# Patient Record
Sex: Male | Born: 1965 | Race: White | Hispanic: No | Marital: Single | Smoking: Never smoker
Health system: Southern US, Community
[De-identification: ages and names within clinical notes are randomized; demographics above are authoritative.]

## PROBLEM LIST (undated history)

## (undated) DIAGNOSIS — M1990 Unspecified osteoarthritis, unspecified site: Secondary | ICD-10-CM

## (undated) DIAGNOSIS — Z87442 Personal history of urinary calculi: Secondary | ICD-10-CM

## (undated) DIAGNOSIS — F419 Anxiety disorder, unspecified: Secondary | ICD-10-CM

## (undated) DIAGNOSIS — R519 Headache, unspecified: Secondary | ICD-10-CM

## (undated) DIAGNOSIS — K219 Gastro-esophageal reflux disease without esophagitis: Secondary | ICD-10-CM

## (undated) DIAGNOSIS — R51 Headache: Secondary | ICD-10-CM

## (undated) DIAGNOSIS — N200 Calculus of kidney: Secondary | ICD-10-CM

## (undated) HISTORY — PX: KNEE SURGERY: SHX244

## (undated) HISTORY — DX: Calculus of kidney: N20.0

## (undated) HISTORY — PX: KNEE ARTHROSCOPY: SUR90

## (undated) HISTORY — PX: SHOULDER SURGERY: SHX246

## (undated) HISTORY — PX: TONSILLECTOMY AND ADENOIDECTOMY: SHX28

## (undated) HISTORY — PX: OTHER SURGICAL HISTORY: SHX169

---

## 2001-12-24 ENCOUNTER — Ambulatory Visit (HOSPITAL_BASED_OUTPATIENT_CLINIC_OR_DEPARTMENT_OTHER): Admission: RE | Admit: 2001-12-24 | Discharge: 2001-12-24 | Payer: Self-pay | Admitting: Orthopedic Surgery

## 2002-01-07 ENCOUNTER — Encounter: Admission: RE | Admit: 2002-01-07 | Discharge: 2002-01-22 | Payer: Self-pay | Admitting: Orthopedic Surgery

## 2002-02-06 ENCOUNTER — Ambulatory Visit (HOSPITAL_BASED_OUTPATIENT_CLINIC_OR_DEPARTMENT_OTHER): Admission: RE | Admit: 2002-02-06 | Discharge: 2002-02-07 | Payer: Self-pay | Admitting: Orthopedic Surgery

## 2002-02-20 ENCOUNTER — Encounter: Admission: RE | Admit: 2002-02-20 | Discharge: 2002-04-24 | Payer: Self-pay | Admitting: Orthopedic Surgery

## 2011-12-08 DIAGNOSIS — N200 Calculus of kidney: Secondary | ICD-10-CM

## 2011-12-08 HISTORY — DX: Calculus of kidney: N20.0

## 2012-05-10 ENCOUNTER — Ambulatory Visit (INDEPENDENT_AMBULATORY_CARE_PROVIDER_SITE_OTHER): Payer: PRIVATE HEALTH INSURANCE | Admitting: Family Medicine

## 2012-05-10 VITALS — BP 120/79 | HR 54 | Temp 97.4°F | Resp 16 | Ht 68.0 in | Wt 193.4 lb

## 2012-05-10 DIAGNOSIS — N400 Enlarged prostate without lower urinary tract symptoms: Secondary | ICD-10-CM

## 2012-05-10 DIAGNOSIS — R109 Unspecified abdominal pain: Secondary | ICD-10-CM

## 2012-05-10 DIAGNOSIS — N200 Calculus of kidney: Secondary | ICD-10-CM

## 2012-05-10 LAB — POCT URINALYSIS DIPSTICK
Bilirubin, UA: NEGATIVE
Glucose, UA: NEGATIVE
Ketones, UA: NEGATIVE
Leukocytes, UA: NEGATIVE
Nitrite, UA: NEGATIVE
Protein, UA: NEGATIVE
Spec Grav, UA: 1.02
Urobilinogen, UA: 0.2
pH, UA: 5.5

## 2012-05-10 LAB — IFOBT (OCCULT BLOOD): IFOBT: NEGATIVE

## 2012-05-10 LAB — POCT CBC
Granulocyte percent: 76.1 %G (ref 37–80)
HCT, POC: 50.3 % (ref 43.5–53.7)
Hemoglobin: 16.1 g/dL (ref 14.1–18.1)
Lymph, poc: 1.5 (ref 0.6–3.4)
MCH, POC: 30.3 pg (ref 27–31.2)
MCHC: 32 g/dL (ref 31.8–35.4)
MCV: 94.8 fL (ref 80–97)
MID (cbc): 0.4 (ref 0–0.9)
MPV: 9.2 fL (ref 0–99.8)
POC Granulocyte: 6 (ref 2–6.9)
POC LYMPH PERCENT: 18.4 %L (ref 10–50)
POC MID %: 5.5 %M (ref 0–12)
Platelet Count, POC: 311 10*3/uL (ref 142–424)
RBC: 5.31 M/uL (ref 4.69–6.13)
RDW, POC: 13.1 %
WBC: 7.9 10*3/uL (ref 4.6–10.2)

## 2012-05-10 LAB — POCT UA - MICROSCOPIC ONLY
Bacteria, U Microscopic: NEGATIVE
Casts, Ur, LPF, POC: NEGATIVE
Crystals, Ur, HPF, POC: NEGATIVE
Mucus, UA: POSITIVE
Yeast, UA: NEGATIVE

## 2012-05-10 MED ORDER — TAMSULOSIN HCL 0.4 MG PO CAPS: 0.4000 mg | ORAL_CAPSULE | Freq: Every day | ORAL | Status: DC

## 2012-05-10 NOTE — Patient Instructions (Signed)

## 2012-05-10 NOTE — Progress Notes (Signed)
46 yo man with acute right flank pain, urgency, hesitancy, and vomiting.  He went to work, and after suffering intense pain for a while, vomited on the way over here and pain suddenly resolved.  PMHx:  Positive for panic attack in the past.  Has h/o BPH after ER evaluation.  He's had problems passing uring for past 4 months.  F/Hx:  Father had kidney stones  Objective:  NAD, alert,  Abdomen:  Neg for tenderness, HSM, masses Genitalia:  Normal Prostate:  Mild enlargement without nodules Results for orders placed in visit on 05/10/12  POCT UA - MICROSCOPIC ONLY      Component Value Range   WBC, Ur, HPF, POC 0-1     RBC, urine, microscopic 5-8     Bacteria, U Microscopic neg     Mucus, UA positive     Epithelial cells, urine per micros 0-1     Crystals, Ur, HPF, POC neg     Casts, Ur, LPF, POC neg     Yeast, UA neg    POCT URINALYSIS DIPSTICK      Component Value Range   Color, UA yellow     Clarity, UA clear     Glucose, UA neg     Bilirubin, UA neg     Ketones, UA neg     Spec Grav, UA 1.020     Blood, UA moderate     pH, UA 5.5     Protein, UA neg     Urobilinogen, UA 0.2     Nitrite, UA neg     Leukocytes, UA Negative    IFOBT (OCCULT BLOOD)      Component Value Range   IFOBT Negative    POCT CBC      Component Value Range   WBC 7.9  4.6 - 10.2 K/uL   Lymph, poc 1.5  0.6 - 3.4   POC LYMPH PERCENT 18.4  10 - 50 %L   MID (cbc) 0.4  0 - 0.9   POC MID % 5.5  0 - 12 %M   POC Granulocyte 6.0  2 - 6.9   Granulocyte percent 76.1  37 - 80 %G   RBC 5.31  4.69 - 6.13 M/uL   Hemoglobin 16.1  14.1 - 18.1 g/dL   HCT, POC 16.1  09.6 - 53.7 %   MCV 94.8  80 - 97 fL   MCH, POC 30.3  27 - 31.2 pg   MCHC 32.0  31.8 - 35.4 g/dL   RDW, POC 04.5     Platelet Count, POC 311  142 - 424 K/uL   MPV 9.2  0 - 99.8 fL   A:  Acute kidney stone which appears to have passed.

## 2012-05-11 LAB — PSA: PSA: 0.74 ng/mL (ref <=4.00)

## 2012-05-11 LAB — COMPREHENSIVE METABOLIC PANEL
ALT: 18 U/L (ref 0–53)
AST: 20 U/L (ref 0–37)
Albumin: 4.4 g/dL (ref 3.5–5.2)
Alkaline Phosphatase: 81 U/L (ref 39–117)
BUN: 8 mg/dL (ref 6–23)
CO2: 29 mEq/L (ref 19–32)
Calcium: 9.2 mg/dL (ref 8.4–10.5)
Chloride: 103 mEq/L (ref 96–112)
Creat: 0.93 mg/dL (ref 0.50–1.35)
Glucose, Bld: 98 mg/dL (ref 70–99)
Potassium: 4.2 mEq/L (ref 3.5–5.3)
Sodium: 140 mEq/L (ref 135–145)
Total Bilirubin: 0.9 mg/dL (ref 0.3–1.2)
Total Protein: 6.9 g/dL (ref 6.0–8.3)

## 2012-05-11 LAB — LIPID PANEL
Cholesterol: 138 mg/dL (ref 0–200)
HDL: 43 mg/dL (ref >39)
LDL Cholesterol: 70 mg/dL (ref 0–99)
Total CHOL/HDL Ratio: 3.2 Ratio
Triglycerides: 125 mg/dL (ref <150)
VLDL: 25 mg/dL (ref 0–40)

## 2012-06-17 ENCOUNTER — Other Ambulatory Visit (HOSPITAL_COMMUNITY): Payer: Self-pay | Admitting: Urology

## 2012-06-17 ENCOUNTER — Ambulatory Visit (HOSPITAL_COMMUNITY)
Admission: RE | Admit: 2012-06-17 | Discharge: 2012-06-17 | Disposition: A | Discharge status: Home / Self Care | Payer: PRIVATE HEALTH INSURANCE | Source: Ambulatory Visit | Attending: Urology | Admitting: Urology

## 2012-06-17 DIAGNOSIS — N201 Calculus of ureter: Secondary | ICD-10-CM | POA: Insufficient documentation

## 2012-06-17 DIAGNOSIS — R1031 Right lower quadrant pain: Secondary | ICD-10-CM | POA: Insufficient documentation

## 2012-06-17 DIAGNOSIS — R3911 Hesitancy of micturition: Secondary | ICD-10-CM | POA: Insufficient documentation

## 2015-03-01 ENCOUNTER — Telehealth: Payer: Self-pay | Admitting: Family Medicine

## 2015-03-02 NOTE — Telephone Encounter (Signed)
Stp and he ntbs to establish care, given appt with Jannifer Rodneyhristy Hawks 7/6 at 9:10. Pt aware to arrive 30 minutes prior with a copy of insurance card and a valid photo ID. Pt only takes ibuprofen on a daily basis.

## 2015-04-14 ENCOUNTER — Encounter: Payer: Self-pay | Admitting: Family

## 2015-04-14 ENCOUNTER — Ambulatory Visit (INDEPENDENT_AMBULATORY_CARE_PROVIDER_SITE_OTHER): Payer: PRIVATE HEALTH INSURANCE | Admitting: Family

## 2015-04-14 VITALS — BP 120/87 | HR 68 | Temp 97.0°F | Ht 68.0 in | Wt 205.0 lb

## 2015-04-14 DIAGNOSIS — K921 Melena: Secondary | ICD-10-CM

## 2015-04-14 DIAGNOSIS — Z0000 Encounter for general adult medical examination without abnormal findings: Secondary | ICD-10-CM | POA: Diagnosis not present

## 2015-04-14 DIAGNOSIS — M25562 Pain in left knee: Secondary | ICD-10-CM

## 2015-04-14 DIAGNOSIS — G8929 Other chronic pain: Secondary | ICD-10-CM

## 2015-04-14 DIAGNOSIS — R5383 Other fatigue: Secondary | ICD-10-CM

## 2015-04-14 MED ORDER — TRAMADOL HCL 50 MG PO TABS: 50.0000 mg | ORAL_TABLET | Freq: Three times a day (TID) | ORAL | Status: DC | PRN

## 2015-04-14 NOTE — Patient Instructions (Addendum)
Health Maintenance A healthy lifestyle and preventative care can promote health and wellness.  Maintain regular health, dental, and eye exams.  Eat a healthy diet. Foods like vegetables, fruits, whole grains, low-fat dairy products, and lean protein foods contain the nutrients you need and are low in calories. Decrease your intake of foods high in solid fats, added sugars, and salt. Get information about a proper diet from your health care provider, if necessary.  Regular physical exercise is one of the most important things you can do for your health. Most adults should get at least 150 minutes of moderate-intensity exercise (any activity that increases your heart rate and causes you to sweat) each week. In addition, most adults need muscle-strengthening exercises on 2 or more days a week.   Maintain a healthy weight. The body mass index (BMI) is a screening tool to identify possible weight problems. It provides an estimate of body fat based on height and weight. Your health care provider can find your BMI and can help you achieve or maintain a healthy weight. For males 20 years and older:  A BMI below 18.5 is considered underweight.  A BMI of 18.5 to 24.9 is normal.  A BMI of 25 to 29.9 is considered overweight.  A BMI of 30 and above is considered obese.  Maintain normal blood lipids and cholesterol by exercising and minimizing your intake of saturated fat. Eat a balanced diet with plenty of fruits and vegetables. Blood tests for lipids and cholesterol should begin at age 20 and be repeated every 5 years. If your lipid or cholesterol levels are high, you are over age 50, or you are at high risk for heart disease, you may need your cholesterol levels checked more frequently.Ongoing high lipid and cholesterol levels should be treated with medicines if diet and exercise are not working.  If you smoke, find out from your health care provider how to quit. If you do not use tobacco, do not  start.  Lung cancer screening is recommended for adults aged 55-80 years who are at high risk for developing lung cancer because of a history of smoking. A yearly low-dose CT scan of the lungs is recommended for people who have at least a 30-pack-year history of smoking and are current smokers or have quit within the past 15 years. A pack year of smoking is smoking an average of 1 pack of cigarettes a day for 1 year (for example, a 30-pack-year history of smoking could mean smoking 1 pack a day for 30 years or 2 packs a day for 15 years). Yearly screening should continue until the smoker has stopped smoking for at least 15 years. Yearly screening should be stopped for people who develop a health problem that would prevent them from having lung cancer treatment.  If you choose to drink alcohol, do not have more than 2 drinks per day. One drink is considered to be 12 oz (360 mL) of beer, 5 oz (150 mL) of wine, or 1.5 oz (45 mL) of liquor.  Avoid the use of street drugs. Do not share needles with anyone. Ask for help if you need support or instructions about stopping the use of drugs.  High blood pressure causes heart disease and increases the risk of stroke. Blood pressure should be checked at least every 1-2 years. Ongoing high blood pressure should be treated with medicines if weight loss and exercise are not effective.  If you are 45-79 years old, ask your health care provider if   you should take aspirin to prevent heart disease.  Diabetes screening involves taking a blood sample to check your fasting blood sugar level. This should be done once every 3 years after age 45 if you are at a normal weight and without risk factors for diabetes. Testing should be considered at a younger age or be carried out more frequently if you are overweight and have at least 1 risk factor for diabetes.  Colorectal cancer can be detected and often prevented. Most routine colorectal cancer screening begins at the age of 50  and continues through age 75. However, your health care provider may recommend screening at an earlier age if you have risk factors for colon cancer. On a yearly basis, your health care provider may provide home test kits to check for hidden blood in the stool. A small camera at the end of a tube may be used to directly examine the colon (sigmoidoscopy or colonoscopy) to detect the earliest forms of colorectal cancer. Talk to your health care provider about this at age 50 when routine screening begins. A direct exam of the colon should be repeated every 5-10 years through age 75, unless early forms of precancerous polyps or small growths are found.  People who are at an increased risk for hepatitis B should be screened for this virus. You are considered at high risk for hepatitis B if:  You were born in a country where hepatitis B occurs often. Talk with your health care provider about which countries are considered high risk.  Your parents were born in a high-risk country and you have not received a shot to protect against hepatitis B (hepatitis B vaccine).  You have HIV or AIDS.  You use needles to inject street drugs.  You live with, or have sex with, someone who has hepatitis B.  You are a man who has sex with other men (MSM).  You get hemodialysis treatment.  You take certain medicines for conditions like cancer, organ transplantation, and autoimmune conditions.  Hepatitis C blood testing is recommended for all people born from 1945 through 1965 and any individual with known risk factors for hepatitis C.  Healthy men should no longer receive prostate-specific antigen (PSA) blood tests as part of routine cancer screening. Talk to your health care provider about prostate cancer screening.  Testicular cancer screening is not recommended for adolescents or adult males who have no symptoms. Screening includes self-exam, a health care provider exam, and other screening tests. Consult with your  health care provider about any symptoms you have or any concerns you have about testicular cancer.  Practice safe sex. Use condoms and avoid high-risk sexual practices to reduce the spread of sexually transmitted infections (STIs).  You should be screened for STIs, including gonorrhea and chlamydia if:  You are sexually active and are younger than 24 years.  You are older than 24 years, and your health care provider tells you that you are at risk for this type of infection.  Your sexual activity has changed since you were last screened, and you are at an increased risk for chlamydia or gonorrhea. Ask your health care provider if you are at risk.  If you are at risk of being infected with HIV, it is recommended that you take a prescription medicine daily to prevent HIV infection. This is called pre-exposure prophylaxis (PrEP). You are considered at risk if:  You are a man who has sex with other men (MSM).  You are a heterosexual man who   is sexually active with multiple partners.  You take drugs by injection.  You are sexually active with a partner who has HIV.  Talk with your health care provider about whether you are at high risk of being infected with HIV. If you choose to begin PrEP, you should first be tested for HIV. You should then be tested every 3 months for as long as you are taking PrEP.  Use sunscreen. Apply sunscreen liberally and repeatedly throughout the day. You should seek shade when your shadow is shorter than you. Protect yourself by wearing long sleeves, pants, a wide-brimmed hat, and sunglasses year round whenever you are outdoors.  Tell your health care provider of new moles or changes in moles, especially if there is a change in shape or color. Also, tell your health care provider if a mole is larger than the size of a pencil eraser.  A one-time screening for abdominal aortic aneurysm (AAA) and surgical repair of large AAAs by ultrasound is recommended for men aged  65-75 years who are current or former smokers.  Stay current with your vaccines (immunizations). Document Released: 03/23/2008 Document Revised: 09/30/2013 Document Reviewed: 02/20/2011 [facility] Patient Information 2015 [place], [place]. This information is not intended to replace advice given to you by your health care provider. Make sure you discuss any questions you have with your health care provider. Knee Pain The knee is the complex joint between your thigh and your lower leg. It is made up of bones, tendons, ligaments, and cartilage. The bones that make up the knee are:  The femur in the thigh.  The tibia and fibula in the lower leg.  The patella or kneecap riding in the groove on the lower femur. CAUSES  Knee pain is a common complaint with many causes. A few of these causes are:  Injury, such as:  A ruptured ligament or tendon injury.  Torn cartilage.  Medical conditions, such as:  Gout  Arthritis  Infections  Overuse, over training, or overdoing a physical activity. Knee pain can be minor or severe. Knee pain can accompany debilitating injury. Minor knee problems often respond well to self-care measures or get well on their own. More serious injuries may need medical intervention or even surgery. SYMPTOMS The knee is complex. Symptoms of knee problems can vary widely. Some of the problems are:  Pain with movement and weight bearing.  Swelling and tenderness.  Buckling of the knee.  Inability to straighten or extend your knee.  Your knee locks and you cannot straighten it.  Warmth and redness with pain and fever.  Deformity or dislocation of the kneecap. DIAGNOSIS  Determining what is wrong may be very straight forward such as when there is an injury. It can also be challenging because of the complexity of the knee. Tests to make a diagnosis may include:  Your caregiver taking a history and doing a physical exam.  Routine X-rays can be used to rule out  other problems. X-rays will not reveal a cartilage tear. Some injuries of the knee can be diagnosed by:  Arthroscopy a surgical technique by which a small video camera is inserted through tiny incisions on the sides of the knee. This procedure is used to examine and repair internal knee joint problems. Tiny instruments can be used during arthroscopy to repair the torn knee cartilage (meniscus).  Arthrography is a radiology technique. A contrast liquid is directly injected into the knee joint. Internal structures of the knee joint then become visible on X-ray  film.  An MRI scan is a non X-ray radiology procedure in which magnetic fields and a computer produce two- or three-dimensional images of the inside of the knee. Cartilage tears are often visible using an MRI scanner. MRI scans have largely replaced arthrography in diagnosing cartilage tears of the knee.  Blood work.  Examination of the fluid that helps to lubricate the knee joint (synovial fluid). This is done by taking a sample out using a needle and a syringe. TREATMENT The treatment of knee problems depends on the cause. Some of these treatments are:  Depending on the injury, proper casting, splinting, surgery, or physical therapy care will be needed.  Give yourself adequate recovery time. Do not overuse your joints. If you begin to get sore during workout routines, back off. Slow down or do fewer repetitions.  For repetitive activities such as cycling or running, maintain your strength and nutrition.  Alternate muscle groups. For example, if you are a weight lifter, work the upper body on one day and the lower body the next.  Either tight or weak muscles do not give the proper support for your knee. Tight or weak muscles do not absorb the stress placed on the knee joint. Keep the muscles surrounding the knee strong.  Take care of mechanical problems.  If you have flat feet, orthotics or special shoes may help. See your caregiver if  you need help.  Arch supports, sometimes with wedges on the inner or outer aspect of the heel, can help. These can shift pressure away from the side of the knee most bothered by osteoarthritis.  A brace called an "unloader" brace also may be used to help ease the pressure on the most arthritic side of the knee.  If your caregiver has prescribed crutches, braces, wraps or ice, use as directed. The acronym for this is PRICE. This means protection, rest, ice, compression, and elevation.  Nonsteroidal anti-inflammatory drugs (NSAIDs), can help relieve pain. But if taken immediately after an injury, they may actually increase swelling. Take NSAIDs with food in your stomach. Stop them if you develop stomach problems. Do not take these if you have a history of ulcers, stomach pain, or bleeding from the bowel. Do not take without your caregiver's approval if you have problems with fluid retention, heart failure, or kidney problems.  For ongoing knee problems, physical therapy may be helpful.  Glucosamine and chondroitin are over-the-counter dietary supplements. Both may help relieve the pain of osteoarthritis in the knee. These medicines are different from the usual anti-inflammatory drugs. Glucosamine may decrease the rate of cartilage destruction.  Injections of a corticosteroid drug into your knee joint may help reduce the symptoms of an arthritis flare-up. They may provide pain relief that lasts a few months. You may have to wait a few months between injections. The injections do have a small increased risk of infection, water retention, and elevated blood sugar levels.  Hyaluronic acid injected into damaged joints may ease pain and provide lubrication. These injections may work by reducing inflammation. A series of shots may give relief for as long as 6 months.  Topical painkillers. Applying certain ointments to your skin may help relieve the pain and stiffness of osteoarthritis. Ask your pharmacist  for suggestions. Many over the-counter products are approved for temporary relief of arthritis pain.  In some countries, doctors often prescribe topical NSAIDs for relief of chronic conditions such as arthritis and tendinitis. A review of treatment with NSAID creams found that they worked as  well as oral medications but without the serious side effects. PREVENTION  Maintain a healthy weight. Extra pounds put more strain on your joints.  Get strong, stay limber. Weak muscles are a common cause of knee injuries. Stretching is important. Include flexibility exercises in your workouts.  Be smart about exercise. If you have osteoarthritis, chronic knee pain or recurring injuries, you may need to change the way you exercise. This does not mean you have to stop being active. If your knees ache after jogging or playing basketball, consider switching to swimming, water aerobics, or other low-impact activities, at least for a few days a week. Sometimes limiting high-impact activities will provide relief.  Make sure your shoes fit well. Choose footwear that is right for your sport.  Protect your knees. Use the proper gear for knee-sensitive activities. Use kneepads when playing volleyball or laying carpet. Buckle your seat belt every time you drive. Most shattered kneecaps occur in car accidents.  Rest when you are tired. SEEK MEDICAL CARE IF:  You have knee pain that is continual and does not seem to be getting better.  SEEK IMMEDIATE MEDICAL CARE IF:  Your knee joint feels hot to the touch and you have a high fever. MAKE SURE YOU:   Understand these instructions.  Will watch your condition.  Will get help right away if you are not doing well or get worse. Document Released: 07/23/2007 Document Revised: 12/18/2011 Document Reviewed: 07/23/2007 [facility] Patient Information 2015 [place]. This information is not intended to replace advice given to you by your health care provider. Make sure  you discuss any questions you have with your health care provider.

## 2015-04-14 NOTE — Progress Notes (Signed)
   Subjective:    Patient ID: Jack [name], male    DOB: 1965/11/24, 49 y.o.   MRN: [id number]  HPI Pt presents to the office to establish care and annual physical. Pt currently only taking Motrin and Tylenol for chronic left knee. Pt has had two different ACL repairs in the past. Pt states he has constipation and has had some bright red blood in his stool. Pt states  his BM is painful. Pt denies any headache, palpitations, SOB, or edema at this time.     Review of Systems  Constitutional: Positive for fatigue.  HENT: Negative.   Respiratory: Negative.   Cardiovascular: Negative.   Gastrointestinal: Positive for constipation and anal bleeding.  Endocrine: Negative.   Genitourinary: Negative.   Musculoskeletal: Negative.   Neurological: Negative.   Hematological: Negative.   Psychiatric/Behavioral: Negative.   All other systems reviewed and are negative.      Objective:   Physical Exam  Constitutional: He is oriented to person, place, and time. He appears well-developed and well-nourished. No distress.  HENT:  Head: Normocephalic.  Right Ear: External ear normal.  Left Ear: External ear normal.  Nose: Nose normal.  Mouth/Throat: Oropharynx is clear and moist.  Eyes: Pupils are equal, round, and reactive to light. Right eye exhibits no discharge. Left eye exhibits no discharge.  Neck: Normal range of motion. Neck supple. No thyromegaly present.  Cardiovascular: Normal rate, regular rhythm, normal heart sounds and intact distal pulses.   No murmur heard. Pulmonary/Chest: Effort normal and breath sounds normal. No respiratory distress. He has no wheezes.  Abdominal: Soft. Bowel sounds are normal. He exhibits no distension. There is no tenderness.  Musculoskeletal: Normal range of motion. He exhibits no edema or tenderness.  Neurological: He is alert and oriented to person, place, and time. He has normal reflexes. No cranial nerve deficit.  Skin: Skin is warm and dry. No rash  noted. No erythema.  Psychiatric: He has a normal mood and affect. His behavior is normal. Judgment and thought content normal.  Vitals reviewed.   BP 120/87 mmHg  Pulse 68  Temp(Src) 97 F (36.1 C) (Oral)  Ht $R'5\' 8"'wC$  (1.727 m)  Wt 205 lb (92.987 kg)  BMI 31.18 kg/m2       Assessment & Plan:  1. Laboratory tests ordered as part of a complete physical exam (CPE) - CMP14+EGFR - Lipid panel - Thyroid Panel With TSH - Vit D  25 hydroxy (rtn osteoporosis monitoring) - PSA Total+%Free (Serial) - Anemia Profile B  2. Blood in stool - Anemia Profile B  3. Other fatigue - Thyroid Panel With TSH - Vit D  25 hydroxy (rtn osteoporosis monitoring) - Anemia Profile B  4. Chronic pain of left knee - Anemia Profile B - Ambulatory referral to Orthopedic Surgery - traMADol (ULTRAM) 50 MG tablet; Take 1 tablet (50 mg total) by mouth every 8 (eight) hours as needed.  Dispense: 90 tablet; Refill: 0  5. Annual physical exam   Continue all meds Labs pending Health Maintenance reviewed Diet and exercise encouraged RTO 6 months   Evelina Dun, FNP

## 2015-04-15 ENCOUNTER — Telehealth: Payer: Self-pay | Admitting: *Deleted

## 2015-04-15 ENCOUNTER — Other Ambulatory Visit: Payer: Self-pay | Admitting: Family

## 2015-04-15 DIAGNOSIS — E559 Vitamin D deficiency, unspecified: Secondary | ICD-10-CM

## 2015-04-15 LAB — ANEMIA PROFILE B
BASOS ABS: 0.1 10*3/uL (ref 0.0–0.2)
BASOS: 1 %
EOS (ABSOLUTE): 0.2 10*3/uL (ref 0.0–0.4)
EOS: 2 %
FERRITIN: 86 ng/mL (ref 30–400)
Folate: 10.9 ng/mL (ref >3.0)
HEMOGLOBIN: 15.7 g/dL (ref 12.6–17.7)
Hematocrit: 46.1 % (ref 37.5–51.0)
IMMATURE GRANS (ABS): 0 10*3/uL (ref 0.0–0.1)
IMMATURE GRANULOCYTES: 0 %
Iron Saturation: 32 % (ref 15–55)
Iron: 94 ug/dL (ref 38–169)
Lymphocytes Absolute: 1.5 10*3/uL (ref 0.7–3.1)
Lymphs: 21 %
MCH: 30.6 pg (ref 26.6–33.0)
MCHC: 34.1 g/dL (ref 31.5–35.7)
MCV: 90 fL (ref 79–97)
MONOCYTES: 8 %
Monocytes Absolute: 0.6 10*3/uL (ref 0.1–0.9)
NEUTROS PCT: 68 %
Neutrophils Absolute: 5 10*3/uL (ref 1.4–7.0)
PLATELETS: 302 10*3/uL (ref 150–379)
RBC: 5.13 x10E6/uL (ref 4.14–5.80)
RDW: 14.1 % (ref 12.3–15.4)
Retic Ct Pct: 0.9 % (ref 0.6–2.6)
TIBC: 298 ug/dL (ref 250–450)
UIBC: 204 ug/dL (ref 111–343)
VITAMIN B 12: 228 pg/mL (ref 211–946)
WBC: 7.4 10*3/uL (ref 3.4–10.8)

## 2015-04-15 LAB — PSA TOTAL+% FREE (SERIAL)
PSA FREE PCT: 24.3 %
PSA FREE: 0.17 ng/mL
Prostate Specific Ag, Serum: 0.7 ng/mL (ref 0.0–4.0)

## 2015-04-15 LAB — CMP14+EGFR
A/G RATIO: 1.9 (ref 1.1–2.5)
ALBUMIN: 4.4 g/dL (ref 3.5–5.5)
ALK PHOS: 95 IU/L (ref 39–117)
ALT: 32 IU/L (ref 0–44)
AST: 30 IU/L (ref 0–40)
BUN / CREAT RATIO: 6 — AB (ref 9–20)
BUN: 7 mg/dL (ref 6–24)
Bilirubin Total: 0.4 mg/dL (ref 0.0–1.2)
CO2: 25 mmol/L (ref 18–29)
CREATININE: 1.09 mg/dL (ref 0.76–1.27)
Calcium: 9.5 mg/dL (ref 8.7–10.2)
Chloride: 104 mmol/L (ref 97–108)
GFR, EST AFRICAN AMERICAN: 93 mL/min/{1.73_m2} (ref >59)
GFR, EST NON AFRICAN AMERICAN: 80 mL/min/{1.73_m2} (ref >59)
GLOBULIN, TOTAL: 2.3 g/dL (ref 1.5–4.5)
Glucose: 101 mg/dL — ABNORMAL HIGH (ref 65–99)
Potassium: 5 mmol/L (ref 3.5–5.2)
SODIUM: 144 mmol/L (ref 134–144)
Total Protein: 6.7 g/dL (ref 6.0–8.5)

## 2015-04-15 LAB — THYROID PANEL WITH TSH
Free Thyroxine Index: 2.1 (ref 1.2–4.9)
T3 UPTAKE RATIO: 30 % (ref 24–39)
T4 TOTAL: 6.9 ug/dL (ref 4.5–12.0)
TSH: 0.767 u[IU]/mL (ref 0.450–4.500)

## 2015-04-15 LAB — VITAMIN D 25 HYDROXY (VIT D DEFICIENCY, FRACTURES): Vit D, 25-Hydroxy: 27.8 ng/mL — ABNORMAL LOW (ref 30.0–100.0)

## 2015-04-15 LAB — LIPID PANEL
CHOLESTEROL TOTAL: 166 mg/dL (ref 100–199)
Chol/HDL Ratio: 3.8 ratio units (ref 0.0–5.0)
HDL: 44 mg/dL (ref >39)
LDL Calculated: 93 mg/dL (ref 0–99)
TRIGLYCERIDES: 144 mg/dL (ref 0–149)
VLDL Cholesterol Cal: 29 mg/dL (ref 5–40)

## 2015-04-15 MED ORDER — VITAMIN D (ERGOCALCIFEROL) 1.25 MG (50000 UNIT) PO CAPS: 50000.0000 [IU] | ORAL_CAPSULE | ORAL | Status: DC

## 2015-04-15 NOTE — Telephone Encounter (Signed)
-----   Message from Junie Spencerhristy A Hawks, FNP sent at 04/15/2015  2:15 PM EDT ----- Kidney and liver function stable Cholesterol levels WNL Thyroid levels WNL Vit D levels low-Prescription sent to pharmacy   PSA levels WNL Anemia Profile (Iron levels, Vitamin B12, Folate, WBC, Hgb, & Plts)- WNL

## 2015-04-16 NOTE — Progress Notes (Signed)
Patient aware.

## 2015-05-26 ENCOUNTER — Encounter: Payer: Self-pay | Admitting: *Deleted

## 2015-06-17 ENCOUNTER — Ambulatory Visit (INDEPENDENT_AMBULATORY_CARE_PROVIDER_SITE_OTHER): Payer: PRIVATE HEALTH INSURANCE | Admitting: Family

## 2015-06-17 ENCOUNTER — Encounter: Payer: Self-pay | Admitting: Family

## 2015-06-17 ENCOUNTER — Ambulatory Visit: Payer: Self-pay | Admitting: Family

## 2015-06-17 ENCOUNTER — Ambulatory Visit (INDEPENDENT_AMBULATORY_CARE_PROVIDER_SITE_OTHER): Payer: PRIVATE HEALTH INSURANCE

## 2015-06-17 VITALS — BP 131/93 | HR 79 | Temp 97.9°F | Ht 68.0 in | Wt 205.4 lb

## 2015-06-17 DIAGNOSIS — Z01818 Encounter for other preprocedural examination: Secondary | ICD-10-CM

## 2015-06-17 NOTE — Patient Instructions (Signed)
Total Knee Replacement °Total knee replacement is a procedure to replace your knee joint with an artificial knee joint (prosthetic knee joint). The purpose of this surgery is to reduce pain and improve your knee function. °LET YOUR HEALTH CARE PROVIDER KNOW ABOUT:  °· Any allergies you have. °· All medicines you are taking, including vitamins, herbs, eye drops, creams, and over-the-counter medicines. °· Previous problems you or members of your family have had with the use of anesthetics. °· Any blood disorders you have. °· Previous surgeries you have had. °· Medical conditions you have. °RISKS AND COMPLICATIONS  °Generally, total knee replacement is a safe procedure. However, problems can occur and include: °· Loss of range of motion of the knee or instability. °· Loosening of the prosthesis. °· Infection. °· Persistent pain. °BEFORE THE PROCEDURE  °· Do not eat or drink anything after midnight on the night before the procedure or as directed by your health care provider. °· Ask your health care provider about changing or stopping your regular medicines. This is especially important if you are taking diabetes medicines or blood thinners. °PROCEDURE  °· Just before the procedure, you will receive medicine that will make you drowsy (sedative). This will be given through a tube that is inserted into one of your veins (IV tube). °· Then you will be given one of the following: °¨ A medicine injected into your spine that numbs your body below the waist (spinal anesthetic). °¨ A medicine that makes you fall asleep (general anesthetic). °¨ You may also receive medicine to block feeling in your leg (nerve block) to help ease pain after surgery. °· An incision will be made in your knee. Your surgeon will take out any damaged cartilage and bone by sawing off the damaged surfaces. °· The surgeon will then put a new metal liner over the sawed-off portion of your thigh bone (femur) and a plastic liner over the sawed-off portion  of one of the bones of your lower leg (tibia). This is to restore alignment and function to your knee. A plastic piece is often used to restore the surface of your knee cap. °AFTER THE PROCEDURE  °· You will be taken to the recovery area. °· You may have drainage tubes to drain excess fluid from your knee. These tubes attach to a device that removes these fluids. °· Once you are awake, stable, and taking fluids well, you will be taken to your hospital room. °· You will receive physical therapy as prescribed by your health care provider. °· Your surgeon may recommend that you spend time (usually an additional 10-14 days) in an extended-care facility to help you begin walking again and improve your range of motion before you go home. °· You may also be prescribed blood-thinning medicine to decrease your risk of developing blood clots in your leg. °Document Released: 01/01/2001 Document Revised: 02/09/2014 Document Reviewed: 11/05/2011 °[employer]® Patient Information ©2015 [employer]. This information is not intended to replace advice given to you by your health care provider. Make sure you discuss any questions you have with your health care provider. ° °

## 2015-06-17 NOTE — Progress Notes (Signed)
   Subjective:    Patient ID: Jack [name], male    DOB: 1965/11/19, 49 y.o.   MRN: [id number]  HPI Pt ;presents to the office surgical clearance for left total knee replacement. Pt currently only taking OTC pain medication and Vit D medication. Pt denies any headache, palpitations, SOB, or edema at this time.     Review of Systems  Constitutional: Negative.   HENT: Negative.   Respiratory: Negative.   Cardiovascular: Negative.   Gastrointestinal: Negative.   Endocrine: Negative.   Genitourinary: Negative.   Musculoskeletal: Negative.   Neurological: Negative.   Hematological: Negative.   Psychiatric/Behavioral: Negative.   All other systems reviewed and are negative.      Objective:   Physical Exam  Constitutional: He is oriented to person, place, and time. He appears well-developed and well-nourished. No distress.  HENT:  Head: Normocephalic.  Right Ear: External ear normal.  Left Ear: External ear normal.  Nose: Nose normal.  Mouth/Throat: Oropharynx is clear and moist.  Eyes: Pupils are equal, round, and reactive to light. Right eye exhibits no discharge. Left eye exhibits no discharge.  Neck: Normal range of motion. Neck supple. No thyromegaly present.  Cardiovascular: Normal rate, regular rhythm, normal heart sounds and intact distal pulses.   No murmur heard. Pulmonary/Chest: Effort normal and breath sounds normal. No respiratory distress. He has no wheezes.  Abdominal: Soft. Bowel sounds are normal. He exhibits no distension. There is no tenderness.  Musculoskeletal: Normal range of motion. He exhibits edema (trace amt in left knee). He exhibits no tenderness.  Neurological: He is alert and oriented to person, place, and time. He has normal reflexes. No cranial nerve deficit.  Skin: Skin is warm and dry. No rash noted. No erythema.  Psychiatric: He has a normal mood and affect. His behavior is normal. Judgment and thought content normal.  Vitals reviewed.  Chest  x-ray- WNL Preliminary reading by Evelina Dun, FNP [facility]  EKG- Sinus Rhythm   BP 131/93 mmHg  Pulse 79  Temp(Src) 97.9 F (36.6 C) (Oral)  Ht _0  (1.727 m)  Wt 205 lb 6.4 oz (93.169 kg)  BMI 31.24 kg/m2     Assessment & Plan:  1. Pre-op examination -Labs pending- Will fax surgical clearance form when labs are back -RTO prn - DG Chest 2 View; Future - EKG 12-Lead - CBC with Differential/Platelet - CMP14+EGFR  Evelina Dun, FNP

## 2015-06-18 ENCOUNTER — Ambulatory Visit: Payer: Self-pay | Admitting: Orthopedic Surgery

## 2015-06-18 LAB — CBC WITH DIFFERENTIAL/PLATELET
BASOS ABS: 0.1 10*3/uL (ref 0.0–0.2)
Basos: 1 %
EOS (ABSOLUTE): 0.5 10*3/uL — AB (ref 0.0–0.4)
Eos: 6 %
Hematocrit: 44.3 % (ref 37.5–51.0)
Hemoglobin: 14.7 g/dL (ref 12.6–17.7)
Immature Grans (Abs): 0 10*3/uL (ref 0.0–0.1)
Immature Granulocytes: 0 %
LYMPHS ABS: 1.5 10*3/uL (ref 0.7–3.1)
Lymphs: 18 %
MCH: 30.2 pg (ref 26.6–33.0)
MCHC: 33.2 g/dL (ref 31.5–35.7)
MCV: 91 fL (ref 79–97)
MONOS ABS: 0.5 10*3/uL (ref 0.1–0.9)
Monocytes: 7 %
Neutrophils Absolute: 5.5 10*3/uL (ref 1.4–7.0)
Neutrophils: 68 %
Platelets: 276 10*3/uL (ref 150–379)
RBC: 4.87 x10E6/uL (ref 4.14–5.80)
RDW: 14.2 % (ref 12.3–15.4)
WBC: 8.1 10*3/uL (ref 3.4–10.8)

## 2015-06-18 LAB — CMP14+EGFR
ALK PHOS: 92 IU/L (ref 39–117)
ALT: 48 IU/L — ABNORMAL HIGH (ref 0–44)
AST: 42 IU/L — AB (ref 0–40)
Albumin/Globulin Ratio: 2 (ref 1.1–2.5)
Albumin: 4.2 g/dL (ref 3.5–5.5)
BUN/Creatinine Ratio: 10 (ref 9–20)
BUN: 10 mg/dL (ref 6–24)
Bilirubin Total: 0.3 mg/dL (ref 0.0–1.2)
CO2: 27 mmol/L (ref 18–29)
CREATININE: 0.99 mg/dL (ref 0.76–1.27)
Calcium: 9 mg/dL (ref 8.7–10.2)
Chloride: 103 mmol/L (ref 97–108)
GFR calc Af Amer: 104 mL/min/{1.73_m2} (ref >59)
GFR calc non Af Amer: 90 mL/min/{1.73_m2} (ref >59)
GLOBULIN, TOTAL: 2.1 g/dL (ref 1.5–4.5)
GLUCOSE: 95 mg/dL (ref 65–99)
Potassium: 4.3 mmol/L (ref 3.5–5.2)
SODIUM: 143 mmol/L (ref 134–144)
Total Protein: 6.3 g/dL (ref 6.0–8.5)

## 2015-06-18 NOTE — Progress Notes (Signed)
Preoperative surgical orders have been place into the Epic hospital system for Jack [name] on 06/18/2015, 1:44 PM  by Patrica Duel for surgery on 07-19-2015.  Preop Total Knee orders including Experal, IV Tylenol, and IV Decadron as long as there are no contraindications to the above medications. Avel Peace, PA-C

## 2015-06-18 NOTE — H&P (Signed)
Jack [name] DOB: [birth year] Divorced / Language: [place] / Race: White Male Date of Admission: 06-18-2015 History of Present Illness The patient is a 49 year old male who comes in for a preoperative History and Physical. The patient is scheduled for a left total knee arthroplasty to be performed by Dr. Gus Rankin. Aluisio, MD at [facility] on 07-19-2015. The patient is a 49 year old male who presents with knee complaints. The patient reports left knee symptoms including: pain, swelling, instability, catching, giving way, soreness and grinding which began year(s) ago without any known injury. The patient feels that the symptoms are worsening. Prior to being seen, the patient was previously evaluated by a colleague year(s) ago (has not had any treatment in over ten years). Pertinent medical history includes osteoarthritis (he reports that he was told years ago that he already had some arthritis in the left knee). Risk factors include arthroscopic surgery (x5, last surgery was ACL reconstruction in 2003 by Dr. Chaney Malling). He states the left knee is bothering him at all times. He works standing on a concrete floor and he said by mid to end of the day, he could barely tolerate any more. He said that the knee is bothering him at all times. It is keep him up at night. It is very limiting and that he cannot do what he desires. The knee also gives out on him. He has had previous ACL reconstruction and he said the knee tends to give out. Dr. Chaney Malling has operated on him in the past. He said two ACL reconstructions. At this point, he states that he cannot tolerate the knee any more and will do what it takes to get this better. He is not really interested in injection, wants more permanent fix as if he said that the problem goes even beyond the pain and that he is having significant dysfunction and instability also. They have been treated conservatively in the past for the above stated problem and despite  conservative measures, they continue to have progressive pain and severe functional limitations and dysfunction. They have failed non-operative management including home exercise, medications, and injections. It is felt that they would benefit from undergoing total joint replacement. Risks and benefits of the procedure have been discussed with the patient and they elect to proceed with surgery. There are no active contraindications to surgery such as ongoing infection or rapidly progressive neurological disease.  Problem List/Past Medical Primary osteoarthritis of left knee (M17.12) Chronic Pain Diverticulitis Of Colon Kidney Stone Anxiety Disorder Migraine Headache Hiatal Hernia Gastroesophageal Reflux Disease Diverticulosis  Allergies TraMADol HCl *ANALGESICS - OPIOID* Nausea.  Family History Cancer grandfather mothers side Chronic Obstructive Lung Disease mother Congestive Heart Failure grandmother mothers side and grandfather fathers side Hypertension grandmother fathers side and grandfather fathers side Osteoporosis mother and grandmother fathers side Rheumatoid Arthritis mother and grandmother mothers side  Social History Drug/Alcohol Rehab (Previously) no Exercise Exercises daily; does running / walking Illicit drug use no Drug/Alcohol Rehab (Currently) no Alcohol use current drinker; drinks beer and hard liquor; only occasionally per week Children 3 Current work status working full time Tobacco use never smoker Tobacco / smoke exposure yes outdoors only Living situation live alone Marital status divorced Pain Contract no  Medication History Aleve (  Tablet, Oral) Active. Multivitamin Adult (Oral) Active.  Past Surgical History Rotator Cuff Repair right Arthroscopy of Knee left Colon Polyp Removal - Colonoscopy Reconstructive ACL Surgery Twice  Review of Systems General Present- Fatigue. Not Present- Chills, Fever,  Memory  Loss, Night Sweats, Weight Gain and Weight Loss. Skin Not Present- Eczema, Hives, Itching, Lesions and Rash. HEENT Present- Headache. Not Present- Dentures, Double Vision, Hearing Loss, Tinnitus and Visual Loss. Respiratory Not Present- Allergies, Chronic Cough, Coughing up blood, Shortness of breath at rest and Shortness of breath with exertion. Cardiovascular Not Present- Chest Pain, Difficulty Breathing Lying Down, Murmur, Palpitations, Racing/skipping heartbeats and Swelling. Gastrointestinal Present- Abdominal Pain, Bloody Stool (History with ibuprofen and Aleve), Constipation, Diarrhea and Heartburn. Not Present- Difficulty Swallowing, Jaundice, Loss of appetitie, Nausea and Vomiting. Male Genitourinary Present- Urinary frequency and Urinating at Night. Not Present- Blood in Urine, Discharge, Flank Pain, Incontinence, Painful Urination, Urgency, Urinary Retention and Weak urinary stream. Musculoskeletal Present- Back Pain, Joint Pain, Joint Swelling, Morning Stiffness, Muscle Pain and Muscle Weakness. Not Present- Spasms. Neurological Not Present- Blackout spells, Difficulty with balance, Dizziness, Paralysis, Tremor and Weakness. Psychiatric Not Present- Insomnia.  Vitals  Weight: 205 lb Height: 68in Weight was reported by patient. Height was reported by patient. Body Surface Area: 2.07 m Body Mass Index: 31.17 kg/m  BP: 122/82 (Sitting, Right Arm, Standard)   Physical Exam General Mental Status -Alert, cooperative and good historian. General Appearance-pleasant, Not in acute distress. Orientation-Oriented X3. Build & Nutrition-Well nourished and Well developed.  Head and Neck Head-normocephalic, atraumatic . Neck Global Assessment - supple, no bruit auscultated on the right, no bruit auscultated on the left.  Eye Vision-Wears corrective lenses. Pupil - Bilateral-Regular and Round. Motion - Bilateral-EOMI.  Chest and Lung Exam Auscultation Breath  sounds - clear at anterior chest wall and clear at posterior chest wall. Adventitious sounds - No Adventitious sounds.  Cardiovascular Auscultation Rhythm - Regular rate and rhythm. Heart Sounds - S1 WNL and S2 WNL. Murmurs & Other Heart Sounds - Auscultation of the heart reveals - No Murmurs.  Abdomen Palpation/Percussion Tenderness - Abdomen is non-tender to palpation. Rigidity (guarding) - Abdomen is soft. Auscultation Auscultation of the abdomen reveals - Bowel sounds normal.  Male Genitourinary Note: Not done, not pertinent to present illness  Musculoskeletal Note: On exam, he is alert and oriented, in no apparent distress. Evaluation of his left hip shows normal range of motion with no discomfort. Evaluation of his left knee shows no effusion. He has got a slight varus deformity. His range of motion of the knee is about 5 to 125. There is marked crepitus in range of motion. There is tenderness medial greater than lateral. There is no instability noted.  RADIOGRAPHS AP both knees and lateral showed the right knee is normal. The left knee shows bone-on-bone change in the medial and patellofemoral compartments.  Assessment & Plan Primary osteoarthritis of left knee (M17.12) Note:Surgical Plans: Left Total Knee Replacement  Disposition: Home  PCP: Dr. Lendon Colonel  IV TXA  Anesthesia Issues: None  Signed electronically by Lauraine Rinne, III PA-C

## 2015-06-22 ENCOUNTER — Ambulatory Visit: Payer: Self-pay | Admitting: Orthopedic Surgery

## 2015-06-22 NOTE — H&P (Signed)
Jack [name] DOB: [birth year] Divorced / Language: Jack [name] / Race: White Male Date of Admission: 07-19-2015 History of Present Illness The patient is a 49 year old male who comes in for a preoperative History and Physical. The patient is scheduled for a left total knee arthroplasty to be performed by Dr. Gus [name]. Aluisio, MD at [facility] on 07-19-2015. The patient is a 49 year old male who presents with knee complaints. The patient reports left knee symptoms including: pain, swelling, instability, catching, giving way, soreness and grinding which began year(s) ago without any known injury. The patient feels that the symptoms are worsening. Prior to being seen, the patient was previously evaluated by a colleague year(s) ago (has not had any treatment in over ten years). Pertinent medical history includes osteoarthritis (he reports that he was told years ago that he already had some arthritis in the left knee). Risk factors include arthroscopic surgery (x5, last surgery was ACL reconstruction in 2003 by Dr. Chaney [name]). He states the left knee is bothering him at all times. He works standing on a concrete floor and he said by mid to end of the day, he could barely tolerate any more. He said that the knee is bothering him at all times. It is keep him up at night. It is very limiting and that he cannot do what he desires. The knee also gives out on him. He has had previous ACL reconstruction and he said the knee tends to give out. Dr. Chaney [name] has operated on him in the past. He said two ACL reconstructions. At this point, he states that he cannot tolerate the knee any more and will do what it takes to get this better. He is not really interested in injection, wants more permanent fix as if he said that the problem goes even beyond the pain and that he is having significant dysfunction and instability also. They have been treated conservatively in the past for the above stated problem and despite  conservative measures, they continue to have progressive pain and severe functional limitations and dysfunction. They have failed non-operative management including home exercise, medications, and injections. It is felt that they would benefit from undergoing total joint replacement. Risks and benefits of the procedure have been discussed with the patient and they elect to proceed with surgery. There are no active contraindications to surgery such as ongoing infection or rapidly progressive neurological disease.  Problem List/Past Medical Primary osteoarthritis of left knee (M17.12) Chronic Pain Diverticulitis Of Colon Kidney Stone Anxiety Disorder Migraine Headache Hiatal Hernia Gastroesophageal Reflux Disease Diverticulosis  Allergies TraMADol HCl *ANALGESICS - OPIOID* Nausea.  Family History Cancer grandfather mothers side Chronic Obstructive Lung Disease mother Congestive Heart Failure grandmother mothers side and grandfather fathers side Hypertension grandmother fathers side and grandfather fathers side Osteoporosis mother and grandmother fathers side Rheumatoid Arthritis mother and grandmother mothers side  Social History Drug/Alcohol Rehab (Previously) no Exercise Exercises daily; does running / walking Illicit drug use no Drug/Alcohol Rehab (Currently) no Alcohol use current drinker; drinks beer and hard liquor; only occasionally per week Children 3 Current work status working full time Tobacco use never smoker Tobacco / smoke exposure yes outdoors only Living situation live alone Marital status divorced Pain Contract no  Medication History Aleve (220MG  Tablet, Oral) Active. Multivitamin Adult (Oral) Active.  Past Surgical History Rotator Cuff Repair right Arthroscopy of Knee left Colon Polyp Removal - Colonoscopy Reconstructive ACL Surgery Twice  Review of Systems General Present- Fatigue. Not Present- Chills, Fever,  Memory  Loss, Night Sweats, Weight Gain and Weight Loss. Skin Not Present- Eczema, Hives, Itching, Lesions and Rash. HEENT Present- Headache. Not Present- Dentures, Double Vision, Hearing Loss, Tinnitus and Visual Loss. Respiratory Not Present- Allergies, Chronic Cough, Coughing up blood, Shortness of breath at rest and Shortness of breath with exertion. Cardiovascular Not Present- Chest Pain, Difficulty Breathing Lying Down, Murmur, Palpitations, Racing/skipping heartbeats and Swelling. Gastrointestinal Present- Abdominal Pain, Bloody Stool (History with ibuprofen and Aleve), Constipation, Diarrhea and Heartburn. Not Present- Difficulty Swallowing, Jaundice, Loss of appetitie, Nausea and Vomiting. Male Genitourinary Present- Urinary frequency and Urinating at Night. Not Present- Blood in Urine, Discharge, Flank Pain, Incontinence, Painful Urination, Urgency, Urinary Retention and Weak urinary stream. Musculoskeletal Present- Back Pain, Joint Pain, Joint Swelling, Morning Stiffness, Muscle Pain and Muscle Weakness. Not Present- Spasms. Neurological Not Present- Blackout spells, Difficulty with balance, Dizziness, Paralysis, Tremor and Weakness. Psychiatric Not Present- Insomnia.  Vitals  Weight: 205 lb Height: 68in Weight was reported by patient. Height was reported by patient. Body Surface Area: 2.07 m Body Mass Index: 31.17 kg/m  BP: 122/82 (Sitting, Right Arm, Standard)   Physical Exam General Mental Status -Alert, cooperative and good historian. General Appearance-pleasant, Not in acute distress. Orientation-Oriented X3. Build & Nutrition-Well nourished and Well developed.  Head and Neck Head-normocephalic, atraumatic . Neck Global Assessment - supple, no bruit auscultated on the right, no bruit auscultated on the left.  Eye Vision-Wears corrective lenses. Pupil - Bilateral-Regular and Round. Motion - Bilateral-EOMI.  Chest and Lung Exam Auscultation Breath  sounds - clear at anterior chest wall and clear at posterior chest wall. Adventitious sounds - No Adventitious sounds.  Cardiovascular Auscultation Rhythm - Regular rate and rhythm. Heart Sounds - S1 WNL and S2 WNL. Murmurs & Other Heart Sounds - Auscultation of the heart reveals - No Murmurs.  Abdomen Palpation/Percussion Tenderness - Abdomen is non-tender to palpation. Rigidity (guarding) - Abdomen is soft. Auscultation Auscultation of the abdomen reveals - Bowel sounds normal.  Male Genitourinary Note: Not done, not pertinent to present illness  Musculoskeletal Note: On exam, he is alert and oriented, in no apparent distress. Evaluation of his left hip shows normal range of motion with no discomfort. Evaluation of his left knee shows no effusion. He has got a slight varus deformity. His range of motion of the knee is about 5 to 125. There is marked crepitus in range of motion. There is tenderness medial greater than lateral. There is no instability noted.  RADIOGRAPHS AP both knees and lateral showed the right knee is normal. The left knee shows bone-on-bone change in the medial and patellofemoral compartments.  Assessment & Plan Primary osteoarthritis of left knee (M17.12) Note:Surgical Plans: Left Total Knee Replacement  Disposition: Home  PCP: Dr. Lendon Colonel  IV TXA  Anesthesia Issues: None  Signed electronically by Lauraine Rinne, III PA-C

## 2015-06-30 NOTE — Patient Instructions (Signed)
Jack [name]  06/30/2015   Your procedure is scheduled on:   07/12/2015    Report to [facility] Main  Entrance take [place]  elevators to 3rd floor to  Short Stay Center at     0830 AM.  Call this number if you have problems the morning of surgery [phone]   Remember: ONLY 1 PERSON MAY GO WITH YOU TO SHORT STAY TO GET  READY MORNING OF YOUR SURGERY.  Do not eat food or drink liquids :After Midnight.     Take these medicines the morning of surgery with A SIP OF WATER: none  DO NOT TAKE ANY DIABETIC MEDICATIONS DAY OF YOUR SURGERY                               You may not have any metal on your body including hair pins and              piercings  Do not wear jewelry, , lotions, powders or perfumes, deodorant             Do not wear nail polish.  Do not shave  48 hours prior to surgery.              Men may shave face and neck.   Do not bring valuables to the hospital. [facility] IS NOT             RESPONSIBLE   FOR VALUABLES.  Contacts, dentures or bridgework may not be worn into surgery.  Leave suitcase in the car. After surgery it may be brought to your room.         Special Instructions: coughing and deep breathing exercises, leg exercises               Please read over the following fact sheets you were given: _____________________________________________________________________             [facility] - Preparing for Surgery Before surgery, you can play an important role.  Because skin is not sterile, your skin needs to be as free of germs as possible.  You can reduce the number of germs on your skin by washing with CHG (chlorahexidine gluconate) soap before surgery.  CHG is an antiseptic cleaner which kills germs and bonds with the skin to continue killing germs even after washing. Please DO NOT use if you have an allergy to CHG or antibacterial soaps.  If your skin becomes reddened/irritated stop using the CHG and inform your nurse when you  arrive at Short Stay. Do not shave (including legs and underarms) for at least 48 hours prior to the first CHG shower.  You may shave your face/neck. Please follow these instructions carefully:  1.  Shower with CHG Soap the night before surgery and the  morning of Surgery.  2.  If you choose to wash your hair, wash your hair first as usual with your  normal  shampoo.  3.  After you shampoo, rinse your hair and body thoroughly to remove the  shampoo.                           4.  Use CHG as you would any other liquid soap.  You can apply chg directly  to the skin and wash  Gently with a scrungie or clean washcloth.  5.  Apply the CHG Soap to your body ONLY FROM THE NECK DOWN.   Do not use on face/ open                           Wound or open sores. Avoid contact with eyes, ears mouth and genitals (private parts).                       Wash face,  Genitals (private parts) with your normal soap.             6.  Wash thoroughly, paying special attention to the area where your surgery  will be performed.  7.  Thoroughly rinse your body with warm water from the neck down.  8.  DO NOT shower/wash with your normal soap after using and rinsing off  the CHG Soap.                9.  Pat yourself dry with a clean towel.            10.  Wear clean pajamas.            11.  Place clean sheets on your bed the night of your first shower and do not  sleep with pets. Day of Surgery : Do not apply any lotions/deodorants the morning of surgery.  Please wear clean clothes to the hospital/surgery center.  FAILURE TO FOLLOW THESE INSTRUCTIONS MAY RESULT IN THE CANCELLATION OF YOUR SURGERY PATIENT SIGNATURE_________________________________  NURSE SIGNATURE__________________________________  ________________________________________________________________________  WHAT IS A BLOOD TRANSFUSION? Blood Transfusion Information  A transfusion is the replacement of blood or some of its parts. Blood  is made up of multiple cells which provide different functions.  Red blood cells carry oxygen and are used for blood loss replacement.  White blood cells fight against infection.  Platelets control bleeding.  Plasma helps clot blood.  Other blood products are available for specialized needs, such as hemophilia or other clotting disorders. BEFORE THE TRANSFUSION  Who gives blood for transfusions?   Healthy volunteers who are fully evaluated to make sure their blood is safe. This is blood bank blood. Transfusion therapy is the safest it has ever been in the practice of medicine. Before blood is taken from a donor, a complete history is taken to make sure that person has no history of diseases nor engages in risky social behavior (examples are intravenous drug use or sexual activity with multiple partners). The donor's travel history is screened to minimize risk of transmitting infections, such as malaria. The donated blood is tested for signs of infectious diseases, such as HIV and hepatitis. The blood is then tested to be sure it is compatible with you in order to minimize the chance of a transfusion reaction. If you or a relative donates blood, this is often done in anticipation of surgery and is not appropriate for emergency situations. It takes many days to process the donated blood. RISKS AND COMPLICATIONS Although transfusion therapy is very safe and saves many lives, the main dangers of transfusion include:  1. Getting an infectious disease. 2. Developing a transfusion reaction. This is an allergic reaction to something in the blood you were given. Every precaution is taken to prevent this. The decision to have a blood transfusion has been considered carefully by your caregiver before blood is given. Blood is not given unless the benefits outweigh  the risks. AFTER THE TRANSFUSION  Right after receiving a blood transfusion, you will usually feel much better and more energetic. This is  especially true if your red blood cells have gotten low (anemic). The transfusion raises the level of the red blood cells which carry oxygen, and this usually causes an energy increase.  The nurse administering the transfusion will monitor you carefully for complications. HOME CARE INSTRUCTIONS  No special instructions are needed after a transfusion. You may find your energy is better. Speak with your caregiver about any limitations on activity for underlying diseases you may have. SEEK MEDICAL CARE IF:   Your condition is not improving after your transfusion.  You develop redness or irritation at the intravenous (IV) site. SEEK IMMEDIATE MEDICAL CARE IF:  Any of the following symptoms occur over the next 12 hours:  Shaking chills.  You have a temperature by mouth above 102 F (38.9 C), not controlled by medicine.  Chest, back, or muscle pain.  People around you feel you are not acting correctly or are confused.  Shortness of breath or difficulty breathing.  Dizziness and fainting.  You get a rash or develop hives.  You have a decrease in urine output.  Your urine turns a dark color or changes to pink, red, or brown. Any of the following symptoms occur over the next 10 days:  You have a temperature by mouth above 102 F (38.9 C), not controlled by medicine.  Shortness of breath.  Weakness after normal activity.  The white part of the eye turns yellow (jaundice).  You have a decrease in the amount of urine or are urinating less often.  Your urine turns a dark color or changes to pink, red, or brown. Document Released: 09/22/2000 Document Revised: 12/18/2011 Document Reviewed: 05/11/2008 Exit[employer] Patient Information 2014 [place].  _______________________________________________________________________  Incentive Spirometer  An incentive spirometer is a tool that can help keep your lungs clear and active. This tool measures how well you are filling your lungs  with each breath. Taking long deep breaths may help reverse or decrease the chance of developing breathing (pulmonary) problems (especially infection) following:  A long period of time when you are unable to move or be active. BEFORE THE PROCEDURE   If the spirometer includes an indicator to show your best effort, your nurse or respiratory therapist will set it to a desired goal.  If possible, sit up straight or lean slightly forward. Try not to slouch.  Hold the incentive spirometer in an upright position. INSTRUCTIONS FOR USE  3. Sit on the edge of your bed if possible, or sit up as far as you can in bed or on a chair. 4. Hold the incentive spirometer in an upright position. 5. Breathe out normally. 6. Place the mouthpiece in your mouth and seal your lips tightly around it. 7. Breathe in slowly and as deeply as possible, raising the piston or the ball toward the top of the column. 8. Hold your breath for 3-5 seconds or for as long as possible. Allow the piston or ball to fall to the bottom of the column. 9. Remove the mouthpiece from your mouth and breathe out normally. 10. Rest for a few seconds and repeat Steps 1 through 7 at least 10 times every 1-2 hours when you are awake. Take your time and take a few normal breaths between deep breaths. 11. The spirometer may include an indicator to show your best effort. Use the indicator as a goal to work  toward during each repetition. 12. After each set of 10 deep breaths, practice coughing to be sure your lungs are clear. If you have an incision (the cut made at the time of surgery), support your incision when coughing by placing a pillow or rolled up towels firmly against it. Once you are able to get out of bed, walk around indoors and cough well. You may stop using the incentive spirometer when instructed by your caregiver.  RISKS AND COMPLICATIONS  Take your time so you do not get dizzy or light-headed.  If you are in pain, you may need to  take or ask for pain medication before doing incentive spirometry. It is harder to take a deep breath if you are having pain. AFTER USE  Rest and breathe slowly and easily.  It can be helpful to keep track of a log of your progress. Your caregiver can provide you with a simple table to help with this. If you are using the spirometer at home, follow these instructions: [place] IF:   You are having difficultly using the spirometer.  You have trouble using the spirometer as often as instructed.  Your pain medication is not giving enough relief while using the spirometer.  You develop fever of 100.5 F (38.1 C) or higher. SEEK IMMEDIATE MEDICAL CARE IF:   You cough up bloody sputum that had not been present before.  You develop fever of 102 F (38.9 C) or greater.  You develop worsening pain at or near the incision site. MAKE SURE YOU:   Understand these instructions.  Will watch your condition.  Will get help right away if you are not doing well or get worse. Document Released: 02/05/2007 Document Revised: 12/18/2011 Document Reviewed: 04/08/2007 [facility] Patient Information 2014 [place], [place].   ________________________________________________________________________

## 2015-07-05 ENCOUNTER — Encounter (HOSPITAL_COMMUNITY): Payer: Self-pay

## 2015-07-05 ENCOUNTER — Encounter (HOSPITAL_COMMUNITY)
Admission: RE | Admit: 2015-07-05 | Discharge: 2015-07-05 | Disposition: A | Discharge status: Home / Self Care | Payer: PRIVATE HEALTH INSURANCE | Source: Ambulatory Visit | Attending: Orthopedic Surgery | Admitting: Orthopedic Surgery

## 2015-07-05 DIAGNOSIS — M179 Osteoarthritis of knee, unspecified: Secondary | ICD-10-CM | POA: Insufficient documentation

## 2015-07-05 DIAGNOSIS — Z01818 Encounter for other preprocedural examination: Secondary | ICD-10-CM | POA: Insufficient documentation

## 2015-07-05 HISTORY — DX: Anxiety disorder, unspecified: F41.9

## 2015-07-05 HISTORY — DX: Headache, unspecified: R51.9

## 2015-07-05 HISTORY — DX: Gastro-esophageal reflux disease without esophagitis: K21.9

## 2015-07-05 HISTORY — DX: Unspecified osteoarthritis, unspecified site: M19.90

## 2015-07-05 HISTORY — DX: Headache: R51

## 2015-07-05 LAB — URINALYSIS, ROUTINE W REFLEX MICROSCOPIC
BILIRUBIN URINE: NEGATIVE
Glucose, UA: NEGATIVE mg/dL
Hgb urine dipstick: NEGATIVE
KETONES UR: NEGATIVE mg/dL
Leukocytes, UA: NEGATIVE
NITRITE: NEGATIVE
Protein, ur: NEGATIVE mg/dL
Specific Gravity, Urine: 1.014 (ref 1.005–1.030)
UROBILINOGEN UA: 0.2 mg/dL (ref 0.0–1.0)
pH: 8 (ref 5.0–8.0)

## 2015-07-05 LAB — COMPREHENSIVE METABOLIC PANEL
ALBUMIN: 4 g/dL (ref 3.5–5.0)
ALT: 59 U/L (ref 17–63)
AST: 43 U/L — AB (ref 15–41)
Alkaline Phosphatase: 85 U/L (ref 38–126)
Anion gap: 7 (ref 5–15)
BILIRUBIN TOTAL: 0.5 mg/dL (ref 0.3–1.2)
BUN: 12 mg/dL (ref 6–20)
CHLORIDE: 105 mmol/L (ref 101–111)
CO2: 26 mmol/L (ref 22–32)
Calcium: 9.2 mg/dL (ref 8.9–10.3)
Creatinine, Ser: 0.86 mg/dL (ref 0.61–1.24)
GFR calc Af Amer: >60 mL/min (ref >60)
GFR calc non Af Amer: >60 mL/min (ref >60)
GLUCOSE: 107 mg/dL — AB (ref 65–99)
POTASSIUM: 4 mmol/L (ref 3.5–5.1)
Sodium: 138 mmol/L (ref 135–145)
Total Protein: 6.8 g/dL (ref 6.5–8.1)

## 2015-07-05 LAB — CBC
HEMATOCRIT: 43.7 % (ref 39.0–52.0)
Hemoglobin: 15.2 g/dL (ref 13.0–17.0)
MCH: 30.7 pg (ref 26.0–34.0)
MCHC: 34.8 g/dL (ref 30.0–36.0)
MCV: 88.3 fL (ref 78.0–100.0)
PLATELETS: 247 10*3/uL (ref 150–400)
RBC: 4.95 MIL/uL (ref 4.22–5.81)
RDW: 12.6 % (ref 11.5–15.5)
WBC: 7.1 10*3/uL (ref 4.0–10.5)

## 2015-07-05 LAB — SURGICAL PCR SCREEN
MRSA, PCR: NEGATIVE
STAPHYLOCOCCUS AUREUS: POSITIVE — AB

## 2015-07-05 LAB — APTT: APTT: 32 s (ref 24–37)

## 2015-07-05 LAB — PROTIME-INR
INR: 1.04 (ref 0.00–1.49)
Prothrombin Time: 13.8 seconds (ref 11.6–15.2)

## 2015-07-05 LAB — ABO/RH: ABO/RH(D): O NEG

## 2015-07-05 NOTE — Progress Notes (Signed)
EKG- 06/17/2015 - EPIC  CXR- 06/18/2015- EPIC  06/17/2015- LOV with PCP- EPIC

## 2015-07-12 ENCOUNTER — Encounter (HOSPITAL_COMMUNITY)
Admission: RE | Disposition: A | Discharge status: Home Health | Payer: Self-pay | Source: Ambulatory Visit | Attending: Orthopedic Surgery

## 2015-07-12 ENCOUNTER — Inpatient Hospital Stay (HOSPITAL_COMMUNITY)
Admission: RE | Admit: 2015-07-12 | Discharge: 2015-07-14 | DRG: 470 | Disposition: A | Discharge status: Home Health | Payer: PRIVATE HEALTH INSURANCE | Source: Ambulatory Visit | Attending: Orthopedic Surgery | Admitting: Orthopedic Surgery

## 2015-07-12 ENCOUNTER — Encounter (HOSPITAL_COMMUNITY): Payer: Self-pay | Admitting: *Deleted

## 2015-07-12 ENCOUNTER — Inpatient Hospital Stay (HOSPITAL_COMMUNITY): Payer: PRIVATE HEALTH INSURANCE | Admitting: Certified Registered Nurse Anesthetist

## 2015-07-12 DIAGNOSIS — K449 Diaphragmatic hernia without obstruction or gangrene: Secondary | ICD-10-CM | POA: Diagnosis present

## 2015-07-12 DIAGNOSIS — M1712 Unilateral primary osteoarthritis, left knee: Principal | ICD-10-CM | POA: Diagnosis present

## 2015-07-12 DIAGNOSIS — M1710 Unilateral primary osteoarthritis, unspecified knee: Secondary | ICD-10-CM | POA: Diagnosis present

## 2015-07-12 DIAGNOSIS — M179 Osteoarthritis of knee, unspecified: Secondary | ICD-10-CM | POA: Diagnosis present

## 2015-07-12 DIAGNOSIS — K219 Gastro-esophageal reflux disease without esophagitis: Secondary | ICD-10-CM | POA: Diagnosis present

## 2015-07-12 DIAGNOSIS — Z01812 Encounter for preprocedural laboratory examination: Secondary | ICD-10-CM

## 2015-07-12 DIAGNOSIS — Z8261 Family history of arthritis: Secondary | ICD-10-CM

## 2015-07-12 DIAGNOSIS — Z87442 Personal history of urinary calculi: Secondary | ICD-10-CM

## 2015-07-12 DIAGNOSIS — G8929 Other chronic pain: Secondary | ICD-10-CM | POA: Diagnosis present

## 2015-07-12 DIAGNOSIS — M25562 Pain in left knee: Secondary | ICD-10-CM | POA: Diagnosis present

## 2015-07-12 HISTORY — PX: TOTAL KNEE ARTHROPLASTY: SHX125

## 2015-07-12 LAB — TYPE AND SCREEN
ABO/RH(D): O NEG
ANTIBODY SCREEN: NEGATIVE

## 2015-07-12 SURGERY — ARTHROPLASTY, KNEE, TOTAL
Anesthesia: General | Site: Knee | Laterality: Left

## 2015-07-12 MED ORDER — PROPOFOL 10 MG/ML IV BOLUS
INTRAVENOUS | Status: DC | PRN
  Administered 2015-07-12: 200 mg via INTRAVENOUS

## 2015-07-12 MED ORDER — LACTATED RINGERS IV SOLN
INTRAVENOUS | Status: DC
  Administered 2015-07-12: 1000 mL via INTRAVENOUS
  Administered 2015-07-12: 12:00:00 via INTRAVENOUS

## 2015-07-12 MED ORDER — CEFAZOLIN SODIUM-DEXTROSE 2-3 GM-% IV SOLR
2.0000 g | INTRAVENOUS | Status: AC
  Administered 2015-07-12: 2 g via INTRAVENOUS

## 2015-07-12 MED ORDER — 0.9 % SODIUM CHLORIDE (POUR BTL) OPTIME
TOPICAL | Status: DC | PRN
  Administered 2015-07-12: 1000 mL

## 2015-07-12 MED ORDER — BUPIVACAINE HCL (PF) 0.25 % IJ SOLN
INTRAMUSCULAR | Status: AC
  Filled 2015-07-12: qty 30

## 2015-07-12 MED ORDER — PROPOFOL 10 MG/ML IV BOLUS
INTRAVENOUS | Status: AC
  Filled 2015-07-12: qty 20

## 2015-07-12 MED ORDER — SODIUM CHLORIDE 0.9 % IV SOLN: INTRAVENOUS | Status: DC

## 2015-07-12 MED ORDER — HYDROMORPHONE HCL 1 MG/ML IJ SOLN
0.5000 mg | INTRAMUSCULAR | Status: DC | PRN
  Administered 2015-07-12 (×2): 0.5 mg via INTRAVENOUS

## 2015-07-12 MED ORDER — MIDAZOLAM HCL 5 MG/5ML IJ SOLN
INTRAMUSCULAR | Status: DC | PRN
  Administered 2015-07-12: 2 mg via INTRAVENOUS

## 2015-07-12 MED ORDER — METOCLOPRAMIDE HCL 10 MG PO TABS: 5.0000 mg | ORAL_TABLET | Freq: Three times a day (TID) | ORAL | Status: DC | PRN

## 2015-07-12 MED ORDER — BUPIVACAINE LIPOSOME 1.3 % IJ SUSP
INTRAMUSCULAR | Status: DC | PRN
  Administered 2015-07-12: 20 mL

## 2015-07-12 MED ORDER — FENTANYL CITRATE (PF) 250 MCG/5ML IJ SOLN
INTRAMUSCULAR | Status: AC
  Filled 2015-07-12: qty 25

## 2015-07-12 MED ORDER — PHENOL 1.4 % MT LIQD: 1.0000 | OROMUCOSAL | Status: DC | PRN

## 2015-07-12 MED ORDER — HYDROMORPHONE HCL 2 MG/ML IJ SOLN
INTRAMUSCULAR | Status: AC
  Filled 2015-07-12: qty 1

## 2015-07-12 MED ORDER — CETYLPYRIDINIUM CHLORIDE 0.05 % MT LIQD
7.0000 mL | Freq: Two times a day (BID) | OROMUCOSAL | Status: DC
  Administered 2015-07-12: 7 mL via OROMUCOSAL

## 2015-07-12 MED ORDER — HYDROMORPHONE HCL 1 MG/ML IJ SOLN
INTRAMUSCULAR | Status: DC | PRN
  Administered 2015-07-12: 1 mg via INTRAVENOUS
  Administered 2015-07-12 (×2): 0.5 mg via INTRAVENOUS

## 2015-07-12 MED ORDER — CHLORHEXIDINE GLUCONATE 4 % EX LIQD: 60.0000 mL | Freq: Once | CUTANEOUS | Status: DC

## 2015-07-12 MED ORDER — TRANEXAMIC ACID 1000 MG/10ML IV SOLN
1000.0000 mg | INTRAVENOUS | Status: AC
  Administered 2015-07-12: 1000 mg via INTRAVENOUS
  Filled 2015-07-12: qty 10

## 2015-07-12 MED ORDER — POLYETHYLENE GLYCOL 3350 17 G PO PACK: 17.0000 g | PACK | Freq: Every day | ORAL | Status: DC | PRN

## 2015-07-12 MED ORDER — PROMETHAZINE HCL 25 MG/ML IJ SOLN: 6.2500 mg | INTRAMUSCULAR | Status: DC | PRN

## 2015-07-12 MED ORDER — CEFAZOLIN SODIUM-DEXTROSE 2-3 GM-% IV SOLR
INTRAVENOUS | Status: AC
  Filled 2015-07-12: qty 50

## 2015-07-12 MED ORDER — ACETAMINOPHEN 325 MG PO TABS: 650.0000 mg | ORAL_TABLET | Freq: Four times a day (QID) | ORAL | Status: DC | PRN

## 2015-07-12 MED ORDER — SODIUM CHLORIDE 0.9 % IR SOLN
Status: DC | PRN
  Administered 2015-07-12: 1000 mL

## 2015-07-12 MED ORDER — FLEET ENEMA 7-19 GM/118ML RE ENEM: 1.0000 | ENEMA | Freq: Once | RECTAL | Status: DC | PRN

## 2015-07-12 MED ORDER — DEXAMETHASONE SODIUM PHOSPHATE 10 MG/ML IJ SOLN
INTRAMUSCULAR | Status: AC
  Filled 2015-07-12: qty 1

## 2015-07-12 MED ORDER — METHOCARBAMOL 500 MG PO TABS
500.0000 mg | ORAL_TABLET | Freq: Four times a day (QID) | ORAL | Status: DC | PRN
  Administered 2015-07-13 – 2015-07-14 (×3): 500 mg via ORAL
  Filled 2015-07-12 (×3): qty 1

## 2015-07-12 MED ORDER — FENTANYL CITRATE (PF) 100 MCG/2ML IJ SOLN
INTRAMUSCULAR | Status: AC
  Filled 2015-07-12: qty 4

## 2015-07-12 MED ORDER — GLYCOPYRROLATE 0.2 MG/ML IJ SOLN
INTRAMUSCULAR | Status: DC | PRN
  Administered 2015-07-12: 0.4 mg via INTRAVENOUS

## 2015-07-12 MED ORDER — FENTANYL CITRATE (PF) 100 MCG/2ML IJ SOLN
INTRAMUSCULAR | Status: DC | PRN
  Administered 2015-07-12: 100 ug via INTRAVENOUS
  Administered 2015-07-12 (×3): 50 ug via INTRAVENOUS
  Administered 2015-07-12: 100 ug via INTRAVENOUS

## 2015-07-12 MED ORDER — SUCCINYLCHOLINE CHLORIDE 20 MG/ML IJ SOLN
INTRAMUSCULAR | Status: DC | PRN
  Administered 2015-07-12: 100 mg via INTRAVENOUS

## 2015-07-12 MED ORDER — METHOCARBAMOL 1000 MG/10ML IJ SOLN
500.0000 mg | Freq: Four times a day (QID) | INTRAMUSCULAR | Status: DC | PRN
  Administered 2015-07-12: 500 mg via INTRAVENOUS
  Filled 2015-07-12 (×2): qty 5

## 2015-07-12 MED ORDER — GLYCOPYRROLATE 0.2 MG/ML IJ SOLN
INTRAMUSCULAR | Status: AC
  Filled 2015-07-12: qty 2

## 2015-07-12 MED ORDER — MORPHINE SULFATE (PF) 2 MG/ML IV SOLN
1.0000 mg | INTRAVENOUS | Status: DC | PRN
  Administered 2015-07-12: 1 mg via INTRAVENOUS
  Filled 2015-07-12: qty 1

## 2015-07-12 MED ORDER — OXYCODONE HCL 5 MG PO TABS
5.0000 mg | ORAL_TABLET | ORAL | Status: DC | PRN
  Administered 2015-07-12 (×2): 5 mg via ORAL
  Administered 2015-07-12 – 2015-07-14 (×7): 10 mg via ORAL
  Filled 2015-07-12 (×8): qty 2

## 2015-07-12 MED ORDER — BUPIVACAINE LIPOSOME 1.3 % IJ SUSP
20.0000 mL | Freq: Once | INTRAMUSCULAR | Status: DC
  Filled 2015-07-12: qty 20

## 2015-07-12 MED ORDER — ACETAMINOPHEN 500 MG PO TABS
1000.0000 mg | ORAL_TABLET | Freq: Four times a day (QID) | ORAL | Status: AC
  Administered 2015-07-12 – 2015-07-13 (×4): 1000 mg via ORAL
  Filled 2015-07-12 (×5): qty 2

## 2015-07-12 MED ORDER — ACETAMINOPHEN 650 MG RE SUPP: 650.0000 mg | Freq: Four times a day (QID) | RECTAL | Status: DC | PRN

## 2015-07-12 MED ORDER — MIDAZOLAM HCL 2 MG/2ML IJ SOLN
INTRAMUSCULAR | Status: AC
  Filled 2015-07-12: qty 2

## 2015-07-12 MED ORDER — KETOROLAC TROMETHAMINE 15 MG/ML IJ SOLN
7.5000 mg | Freq: Four times a day (QID) | INTRAMUSCULAR | Status: AC | PRN
  Administered 2015-07-12: 7.5 mg via INTRAVENOUS
  Filled 2015-07-12: qty 1

## 2015-07-12 MED ORDER — MENTHOL 3 MG MT LOZG
1.0000 | LOZENGE | OROMUCOSAL | Status: DC | PRN
  Administered 2015-07-12: 3 mg via ORAL
  Filled 2015-07-12: qty 9

## 2015-07-12 MED ORDER — DOCUSATE SODIUM 100 MG PO CAPS
100.0000 mg | ORAL_CAPSULE | Freq: Two times a day (BID) | ORAL | Status: DC
  Administered 2015-07-12 – 2015-07-14 (×4): 100 mg via ORAL

## 2015-07-12 MED ORDER — SODIUM CHLORIDE 0.9 % IJ SOLN
INTRAMUSCULAR | Status: AC
  Filled 2015-07-12: qty 50

## 2015-07-12 MED ORDER — DEXAMETHASONE SODIUM PHOSPHATE 10 MG/ML IJ SOLN
10.0000 mg | Freq: Once | INTRAMUSCULAR | Status: AC
  Administered 2015-07-12: 10 mg via INTRAVENOUS

## 2015-07-12 MED ORDER — FENTANYL CITRATE (PF) 100 MCG/2ML IJ SOLN: 25.0000 ug | INTRAMUSCULAR | Status: DC | PRN

## 2015-07-12 MED ORDER — DIPHENHYDRAMINE HCL 12.5 MG/5ML PO ELIX: 12.5000 mg | ORAL_SOLUTION | ORAL | Status: DC | PRN

## 2015-07-12 MED ORDER — ONDANSETRON HCL 4 MG/2ML IJ SOLN: 4.0000 mg | Freq: Four times a day (QID) | INTRAMUSCULAR | Status: DC | PRN

## 2015-07-12 MED ORDER — HYDROMORPHONE HCL 1 MG/ML IJ SOLN
INTRAMUSCULAR | Status: AC
  Filled 2015-07-12: qty 1

## 2015-07-12 MED ORDER — BISACODYL 10 MG RE SUPP: 10.0000 mg | Freq: Every day | RECTAL | Status: DC | PRN

## 2015-07-12 MED ORDER — METOCLOPRAMIDE HCL 5 MG/ML IJ SOLN: 5.0000 mg | Freq: Three times a day (TID) | INTRAMUSCULAR | Status: DC | PRN

## 2015-07-12 MED ORDER — DEXAMETHASONE SODIUM PHOSPHATE 10 MG/ML IJ SOLN
10.0000 mg | Freq: Once | INTRAMUSCULAR | Status: AC
  Administered 2015-07-13: 10 mg via INTRAVENOUS
  Filled 2015-07-12: qty 1

## 2015-07-12 MED ORDER — LIDOCAINE HCL (CARDIAC) 20 MG/ML IV SOLN
INTRAVENOUS | Status: DC | PRN
  Administered 2015-07-12: 100 mg via INTRAVENOUS

## 2015-07-12 MED ORDER — ACETAMINOPHEN 10 MG/ML IV SOLN
1000.0000 mg | Freq: Once | INTRAVENOUS | Status: AC
  Administered 2015-07-12: 1000 mg via INTRAVENOUS
  Filled 2015-07-12: qty 100

## 2015-07-12 MED ORDER — SODIUM CHLORIDE 0.9 % IJ SOLN
INTRAMUSCULAR | Status: DC | PRN
Start: 2015-07-12 — End: 2015-07-12
  Administered 2015-07-12: 50 mL

## 2015-07-12 MED ORDER — MIDAZOLAM HCL 2 MG/2ML IJ SOLN
INTRAMUSCULAR | Status: AC
  Filled 2015-07-12: qty 4

## 2015-07-12 MED ORDER — CHLORHEXIDINE GLUCONATE 0.12 % MT SOLN
15.0000 mL | Freq: Two times a day (BID) | OROMUCOSAL | Status: DC
  Administered 2015-07-12: 15 mL via OROMUCOSAL
  Filled 2015-07-12 (×2): qty 15

## 2015-07-12 MED ORDER — BUPIVACAINE HCL 0.25 % IJ SOLN
INTRAMUSCULAR | Status: DC | PRN
  Administered 2015-07-12: 30 mL

## 2015-07-12 MED ORDER — ONDANSETRON HCL 4 MG PO TABS: 4.0000 mg | ORAL_TABLET | Freq: Four times a day (QID) | ORAL | Status: DC | PRN

## 2015-07-12 MED ORDER — ONDANSETRON HCL 4 MG/2ML IJ SOLN
INTRAMUSCULAR | Status: AC
  Filled 2015-07-12: qty 2

## 2015-07-12 MED ORDER — RIVAROXABAN 10 MG PO TABS
10.0000 mg | ORAL_TABLET | Freq: Every day | ORAL | Status: DC
  Administered 2015-07-13 – 2015-07-14 (×2): 10 mg via ORAL
  Filled 2015-07-12 (×3): qty 1

## 2015-07-12 MED ORDER — SODIUM CHLORIDE 0.9 % IV SOLN
INTRAVENOUS | Status: DC
  Administered 2015-07-12: 16:00:00 via INTRAVENOUS

## 2015-07-12 MED ORDER — MIDAZOLAM HCL 2 MG/2ML IJ SOLN
0.5000 mg | INTRAMUSCULAR | Status: AC | PRN
  Administered 2015-07-12 (×2): 0.5 mg via INTRAVENOUS

## 2015-07-12 MED ORDER — ROCURONIUM BROMIDE 100 MG/10ML IV SOLN
INTRAVENOUS | Status: DC | PRN
  Administered 2015-07-12: 30 mg via INTRAVENOUS

## 2015-07-12 MED ORDER — ONDANSETRON HCL 4 MG/2ML IJ SOLN
INTRAMUSCULAR | Status: DC | PRN
  Administered 2015-07-12: 4 mg via INTRAVENOUS

## 2015-07-12 MED ORDER — CEFAZOLIN SODIUM-DEXTROSE 2-3 GM-% IV SOLR
2.0000 g | Freq: Four times a day (QID) | INTRAVENOUS | Status: AC
  Administered 2015-07-12 (×2): 2 g via INTRAVENOUS
  Filled 2015-07-12 (×2): qty 50

## 2015-07-12 MED ORDER — NEOSTIGMINE METHYLSULFATE 10 MG/10ML IV SOLN
INTRAVENOUS | Status: DC | PRN
  Administered 2015-07-12: 3 mg via INTRAVENOUS

## 2015-07-12 SURGICAL SUPPLY — 63 items
BAG DECANTER FOR FLEXI CONT (MISCELLANEOUS) ×3 IMPLANT
BAG SPEC THK2 15X12 ZIP CLS (MISCELLANEOUS) ×1
BAG ZIPLOCK 12X15 (MISCELLANEOUS) ×3 IMPLANT
BANDAGE ELASTIC 6 VELCRO ST LF (GAUZE/BANDAGES/DRESSINGS) ×3 IMPLANT
BANDAGE ESMARK 6X9 LF (GAUZE/BANDAGES/DRESSINGS) ×1 IMPLANT
BLADE SAG 18X100X1.27 (BLADE) ×3 IMPLANT
BLADE SAW SGTL 11.0X1.19X90.0M (BLADE) ×3 IMPLANT
BNDG CMPR 9X6 STRL LF SNTH (GAUZE/BANDAGES/DRESSINGS) ×1
BNDG ESMARK 6X9 LF (GAUZE/BANDAGES/DRESSINGS) ×3
BOWL SMART MIX CTS (DISPOSABLE) ×3 IMPLANT
CAPT KNEE TOTAL 3 ATTUNE ×2 IMPLANT
CEMENT HV SMART SET (Cement) ×6 IMPLANT
CLOSURE WOUND 1/2 X4 (GAUZE/BANDAGES/DRESSINGS) ×1
CUFF TOURN SGL QUICK 34 (TOURNIQUET CUFF) ×3
CUFF TRNQT CYL 34X4X40X1 (TOURNIQUET CUFF) ×1 IMPLANT
DECANTER SPIKE VIAL GLASS SM (MISCELLANEOUS) ×3 IMPLANT
DRAPE EXTREMITY T 121X128X90 (DRAPE) ×3 IMPLANT
DRAPE POUCH INSTRU U-SHP 10X18 (DRAPES) ×3 IMPLANT
DRAPE U-SHAPE 47X51 STRL (DRAPES) ×3 IMPLANT
DRSG ADAPTIC 3X8 NADH LF (GAUZE/BANDAGES/DRESSINGS) ×3 IMPLANT
DRSG PAD ABDOMINAL 8X10 ST (GAUZE/BANDAGES/DRESSINGS) ×3 IMPLANT
DURAPREP 26ML APPLICATOR (WOUND CARE) ×3 IMPLANT
ELECT REM PT RETURN 9FT ADLT (ELECTROSURGICAL) ×3
ELECTRODE REM PT RTRN 9FT ADLT (ELECTROSURGICAL) ×1 IMPLANT
EVACUATOR 1/8 PVC DRAIN (DRAIN) ×3 IMPLANT
FACESHIELD WRAPAROUND (MASK) ×15 IMPLANT
FACESHIELD WRAPAROUND OR TEAM (MASK) ×5 IMPLANT
GAUZE SPONGE 4X4 12PLY STRL (GAUZE/BANDAGES/DRESSINGS) ×3 IMPLANT
GLOVE BIO SURGEON STRL SZ7.5 (GLOVE) IMPLANT
GLOVE BIO SURGEON STRL SZ8 (GLOVE) ×3 IMPLANT
GLOVE BIOGEL PI IND STRL 6.5 (GLOVE) IMPLANT
GLOVE BIOGEL PI IND STRL 8 (GLOVE) ×1 IMPLANT
GLOVE BIOGEL PI INDICATOR 6.5 (GLOVE)
GLOVE BIOGEL PI INDICATOR 8 (GLOVE) ×2
GLOVE SURG SS PI 6.5 STRL IVOR (GLOVE) IMPLANT
GOWN STRL REUS W/TWL LRG LVL3 (GOWN DISPOSABLE) ×3 IMPLANT
GOWN STRL REUS W/TWL XL LVL3 (GOWN DISPOSABLE) IMPLANT
HANDPIECE INTERPULSE COAX TIP (DISPOSABLE) ×3
IMMOBILIZER KNEE 20 (SOFTGOODS) ×2 IMPLANT
KIT BASIN OR (CUSTOM PROCEDURE TRAY) ×3 IMPLANT
MANIFOLD NEPTUNE II (INSTRUMENTS) ×3 IMPLANT
NDL SAFETY ECLIPSE 18X1.5 (NEEDLE) ×2 IMPLANT
NEEDLE HYPO 18GX1.5 SHARP (NEEDLE) ×6
NS IRRIG 1000ML POUR BTL (IV SOLUTION) ×3 IMPLANT
PACK TOTAL JOINT (CUSTOM PROCEDURE TRAY) ×3 IMPLANT
PADDING CAST COTTON 6X4 STRL (CAST SUPPLIES) ×5 IMPLANT
PEN SKIN MARKING BROAD (MISCELLANEOUS) ×3 IMPLANT
POSITIONER SURGICAL ARM (MISCELLANEOUS) ×3 IMPLANT
SET HNDPC FAN SPRY TIP SCT (DISPOSABLE) ×1 IMPLANT
STRIP CLOSURE SKIN 1/2X4 (GAUZE/BANDAGES/DRESSINGS) ×3 IMPLANT
SUCTION FRAZIER 12FR DISP (SUCTIONS) ×3 IMPLANT
SUT MNCRL AB 4-0 PS2 18 (SUTURE) ×3 IMPLANT
SUT VIC AB 2-0 CT1 27 (SUTURE) ×9
SUT VIC AB 2-0 CT1 TAPERPNT 27 (SUTURE) ×3 IMPLANT
SUT VLOC 180 0 24IN GS25 (SUTURE) ×3 IMPLANT
SYR 20CC LL (SYRINGE) ×3 IMPLANT
SYR 50ML LL SCALE MARK (SYRINGE) ×3 IMPLANT
TOWEL OR 17X26 10 PK STRL BLUE (TOWEL DISPOSABLE) ×3 IMPLANT
TOWEL OR NON WOVEN STRL DISP B (DISPOSABLE) IMPLANT
TRAY FOLEY W/METER SILVER 16FR (SET/KITS/TRAYS/PACK) ×3 IMPLANT
WATER STERILE IRR 1500ML POUR (IV SOLUTION) ×3 IMPLANT
WRAP KNEE MAXI GEL POST OP (GAUZE/BANDAGES/DRESSINGS) ×3 IMPLANT
YANKAUER SUCT BULB TIP 10FT TU (MISCELLANEOUS) ×3 IMPLANT

## 2015-07-12 NOTE — Op Note (Signed)
Pre-operative diagnosis- Osteoarthritis  Left knee(s)  Post-operative diagnosis- Osteoarthritis Left knee(s)  Procedure-  Left  Total Knee Arthroplasty  Surgeon- Gus Rankin. Kenyetta Fife, MD  Assistant- Dimitri Ped, PA-C   Anesthesia-  General  EBL-* No blood loss amount entered *   Drains Hemovac  Tourniquet time-  Total Tourniquet Time Documented: Thigh (Left) - 42 minutes Total: Thigh (Left) - 42 minutes     Complications- None  Condition-PACU - hemodynamically stable.   Brief Clinical Note   Jack [name] is a 49 y.o. year old male with end stage OA of his left knee with progressively worsening pain and dysfunction. He has constant pain, with activity and at rest and significant functional deficits with difficulties even with ADLs. He has had extensive non-op management including analgesics, injections of cortisone, and home exercise program, but remains in significant pain with significant dysfunction. Radiographs show bone on bone arthritis medial and patellofemoral. He presents now for left Total Knee Arthroplasty.     Procedure in detail---   The patient is brought into the operating room and positioned supine on the operating table. After successful administration of  General,   a tourniquet is placed high on the  Left thigh(s) and the lower extremity is prepped and draped in the usual sterile fashion. Time out is performed by the operating team and then the  Left lower extremity is wrapped in Esmarch, knee flexed and the tourniquet inflated to 300 mmHg.       A midline incision is made with a ten blade through the subcutaneous tissue to the level of the extensor mechanism. A fresh blade is used to make a medial parapatellar arthrotomy. Soft tissue over the proximal medial tibia is subperiosteally elevated to the joint line with a knife and into the semimembranosus bursa with a Cobb elevator. Soft tissue over the proximal lateral tibia is elevated with attention being paid to  avoiding the patellar tendon on the tibial tubercle. The patella is everted, knee flexed 90 degrees and the ACL and PCL are removed. Findings are bone on bone medial and patellofemoral with large global osteophytes.        The drill is used to create a starting hole in the distal femur and the canal is thoroughly irrigated with sterile saline to remove the fatty contents. The 5 degree Left  valgus alignment guide is placed into the femoral canal and the distal femoral cutting block is pinned to remove 10 mm off the distal femur. Resection is made with an oscillating saw.      The tibia is subluxed forward and the menisci are removed. The extramedullary alignment guide is placed referencing proximally at the medial aspect of the tibial tubercle and distally along the second metatarsal axis and tibial crest. The block is pinned to remove 2mm off the more deficient medial  side. Resection is made with an oscillating saw. Size 7is the most appropriate size for the tibia and the proximal tibia is prepared with the modular drill and keel punch for that size.      The femoral sizing guide is placed and size 8 is most appropriate. Rotation is marked off the epicondylar axis and confirmed by creating a rectangular flexion gap at 90 degrees. The size 8 cutting block is pinned in this rotation and the anterior, posterior and chamfer cuts are made with the oscillating saw. The intercondylar block is then placed and that cut is made.      Trial size 7 tibial component,  trial size 8 posterior stabilized femur and a 8  mm posterior stabilized rotating platform insert trial is placed. Full extension is achieved with excellent varus/valgus and anterior/posterior balance throughout full range of motion. The patella is everted and thickness measured to be 27  mm. Free hand resection is taken to 15 mm, a 41 template is placed, lug holes are drilled, trial patella is placed, and it tracks normally. Osteophytes are removed off the  posterior femur with the trial in place. All trials are removed and the cut bone surfaces prepared with pulsatile lavage. Cement is mixed and once ready for implantation, the size 7 tibial implant, size  8 posterior stabilized femoral component, and the size 41 patella are cemented in place and the patella is held with the clamp. The trial insert is placed and the knee held in full extension. The Exparel (20 ml mixed with 30 ml saline) and .25% Bupivicaine, are injected into the extensor mechanism, posterior capsule, medial and lateral gutters and subcutaneous tissues.  All extruded cement is removed and once the cement is hard the permanent 8 mm posterior stabilized rotating platform insert is placed into the tibial tray.      The wound is copiously irrigated with saline solution and the extensor mechanism closed over a hemovac drain with #1 V-loc suture. The tourniquet is released for a total tourniquet time of 42  minutes. Flexion against gravity is 140 degrees and the patella tracks normally. Subcutaneous tissue is closed with 2.0 vicryl and subcuticular with running 4.0 Monocryl. The incision is cleaned and dried and steri-strips and a bulky sterile dressing are applied. The limb is placed into a knee immobilizer and the patient is awakened and transported to recovery in stable condition.      Please note that a surgical assistant was a medical necessity for this procedure in order to perform it in a safe and expeditious manner. Surgical assistant was necessary to retract the ligaments and vital neurovascular structures to prevent injury to them and also necessary for proper positioning of the limb to allow for anatomic placement of the prosthesis.   Gus Rankin Demaria Deeney, MD    07/12/2015, 12:47 PM

## 2015-07-12 NOTE — Anesthesia Preprocedure Evaluation (Addendum)
Anesthesia Evaluation  Patient identified by MRN, date of birth, ID band Patient awake    Reviewed: Allergy & Precautions, NPO status , Patient's Chart, lab work & pertinent test results  Airway Mallampati: II  TM Distance: >3 FB Neck ROM: Full    Dental   Pulmonary neg pulmonary ROS,    breath sounds clear to auscultation       Cardiovascular negative cardio ROS   Rhythm:Regular Rate:Normal     Neuro/Psych    GI/Hepatic GERD  ,  Endo/Other  negative endocrine ROS  Renal/GU Renal disease     Musculoskeletal   Abdominal   Peds  Hematology   Anesthesia Other Findings   Reproductive/Obstetrics                             Anesthesia Physical Anesthesia Plan  ASA: II  Anesthesia Plan: General   Post-op Pain Management:    Induction:   Airway Management Planned: Oral ETT  Additional Equipment:   Intra-op Plan:   Post-operative Plan: Extubation in OR  Informed Consent: I have reviewed the patients History and Physical, chart, labs and discussed the procedure including the risks, benefits and alternatives for the proposed anesthesia with the patient or authorized representative who has indicated his/her understanding and acceptance.   Dental advisory given  Plan Discussed with: CRNA and Anesthesiologist  Anesthesia Plan Comments:        Anesthesia Quick Evaluation

## 2015-07-12 NOTE — Anesthesia Postprocedure Evaluation (Signed)
  Anesthesia Post-op Note  Patient: Jack [name]  Procedure(s) Performed: Procedure(s): TOTAL LEFT  KNEE ARTHROPLASTY (Left)  Patient Location: PACU  Anesthesia Type:General  Level of Consciousness: awake  Airway and Oxygen Therapy: Patient Spontanous Breathing  Post-op Pain: mild  Post-op Assessment: Post-op Vital signs reviewed LLE Motor Response: Purposeful movement LLE Sensation: Full sensation          Post-op Vital Signs: Reviewed  Last Vitals:  Filed Vitals:   07/12/15 1439  BP: 141/94  Pulse: 52  Temp: 36.9 C  Resp: 16    Complications: No apparent anesthesia complications

## 2015-07-12 NOTE — Transfer of Care (Signed)
Immediate Anesthesia Transfer of Care Note  Patient: Jack [name]  Procedure(s) Performed: Procedure(s): TOTAL LEFT  KNEE ARTHROPLASTY (Left)  Patient Location: PACU  Anesthesia Type:General  Level of Consciousness:  sedated, patient cooperative and responds to stimulation  Airway & Oxygen Therapy:Patient Spontanous Breathing and Patient connected to face mask oxgen  Post-op Assessment:  Report given to PACU RN and Post -op Vital signs reviewed and stable  Post vital signs:  Reviewed and stable  Last Vitals:  Filed Vitals:   07/12/15 0835  BP: 138/92  Pulse: 64  Temp: 36.3 C  Resp: 18    Complications: No apparent anesthesia complications

## 2015-07-12 NOTE — Anesthesia Procedure Notes (Signed)
Procedure Name: Intubation Date/Time: 07/12/2015 11:41 AM Performed by: Orest Dikes Pre-anesthesia Checklist: Patient identified, Emergency Drugs available, Suction available and Patient being monitored Patient Re-evaluated:Patient Re-evaluated prior to inductionOxygen Delivery Method: Circle System Utilized Preoxygenation: Pre-oxygenation with 100% oxygen Intubation Type: IV induction Ventilation: Mask ventilation without difficulty Laryngoscope Size: Mac and 4 Grade View: Grade I Tube type: Oral Number of attempts: 1 Airway Equipment and Method: Stylet Placement Confirmation: ETT inserted through vocal cords under direct vision,  positive ETCO2 and breath sounds checked- equal and bilateral Secured at: 21 cm Tube secured with: Tape Dental Injury: Teeth and Oropharynx as per pre-operative assessment

## 2015-07-13 LAB — BASIC METABOLIC PANEL
Anion gap: 8 (ref 5–15)
BUN: 9 mg/dL (ref 6–20)
CALCIUM: 8.8 mg/dL — AB (ref 8.9–10.3)
CHLORIDE: 103 mmol/L (ref 101–111)
CO2: 28 mmol/L (ref 22–32)
CREATININE: 0.92 mg/dL (ref 0.61–1.24)
GFR calc non Af Amer: >60 mL/min (ref >60)
GLUCOSE: 145 mg/dL — AB (ref 65–99)
Potassium: 4.5 mmol/L (ref 3.5–5.1)
Sodium: 139 mmol/L (ref 135–145)

## 2015-07-13 LAB — CBC
HEMATOCRIT: 38.6 % — AB (ref 39.0–52.0)
HEMOGLOBIN: 13.3 g/dL (ref 13.0–17.0)
MCH: 30.5 pg (ref 26.0–34.0)
MCHC: 34.5 g/dL (ref 30.0–36.0)
MCV: 88.5 fL (ref 78.0–100.0)
Platelets: 260 10*3/uL (ref 150–400)
RBC: 4.36 MIL/uL (ref 4.22–5.81)
RDW: 12.6 % (ref 11.5–15.5)
WBC: 22.2 10*3/uL — ABNORMAL HIGH (ref 4.0–10.5)

## 2015-07-13 MED ORDER — METHOCARBAMOL 500 MG PO TABS: 500.0000 mg | ORAL_TABLET | Freq: Four times a day (QID) | ORAL | Status: DC | PRN

## 2015-07-13 MED ORDER — RIVAROXABAN 10 MG PO TABS: 10.0000 mg | ORAL_TABLET | Freq: Every day | ORAL | Status: DC

## 2015-07-13 MED ORDER — OXYCODONE HCL 5 MG PO TABS: 5.0000 mg | ORAL_TABLET | ORAL | Status: DC | PRN

## 2015-07-13 NOTE — Evaluation (Signed)
Occupational Therapy Evaluation Patient Details Name: Jack [name] MRN: [id number] DOB: 1966-05-25 Today's Date: 07/13/2015    History of Present Illness L TKA   Clinical Impression   Pt is a 49 y/o male s/p L TKA, whom is currently Min A for lower body ADL's and functional transfers w/ min vc's for safety and sequencing with RW. He will benefit from acute OT to assist with providing education and maximize independence as pt will return home alone. Plan: simulate tub transfers and toilet transfer, a/e education next visit.   Follow Up Recommendations  No OT follow up    Equipment Recommendations  Other (comment) (Cont to assess ?3:1 for use at toilet and in tub PRN)    Recommendations for Other Services       Precautions / Restrictions Precautions Precautions: Knee;Fall Required Braces or Orthoses: Knee Immobilizer - Left Restrictions Weight Bearing Restrictions: No LLE Weight Bearing: Weight bearing as tolerated      Mobility Bed Mobility Overal bed mobility: Modified Independent                Transfers Overall transfer level: Needs assistance Equipment used: Rolling walker (2 wheeled) Transfers: Sit to/from [employer] Sit to Stand: Min assist Stand pivot transfers: Min guard       General transfer comment: VC's for safety and sequencing with RW during functional mobility and transfers. Pt placed RW off to the side x2 during session.    Balance  Not fully assessed, no apparent deficits noted.                                          ADL Overall ADL's : Needs assistance/impaired     Grooming: Wash/dry hands;Standing;Min guard;Wash/dry face;Cueing for safety   Upper Body Bathing: Set up;Sitting   Lower Body Bathing: Sit to/from stand;Min guard;Cueing for safety;Supervison/ safety   Upper Body Dressing : Set up;Sitting   Lower Body Dressing: Min guard;Sit to/from stand;Supervision/safety   Toilet Transfer: Min  guard;RW;Ambulation (Pt stood to use toilet today, need to assess sitting/standing)   Toileting- Clothing Manipulation and Hygiene: Supervision/safety;Min guard (Standing at toilet)   Tub/ Engineer, structural:  ("I am not going to do that, I am going to take bird baths")   Functional mobility during ADLs: Min guard;Cueing for safety;Cueing for sequencing;Rolling walker General ADL Comments: Pt was educated in Role of OT in acute setting, he palns to d/c home alone with family intermittent/PRN assist, Pt plans to sponge bath initially, He requires vc's for safety and sequencing with RW. He participated in ADL retraining session today with focus on bed mobility, toilet transfer/transfer to chair and grooming standing at sink. He will benefit from acute OT to assist with providing education and maximize independence as pt will return home alone.     Vision  "I should wear glasses all the time but I don't" No change from baseline   Perception     Praxis      Pertinent Vitals/Pain Pain Assessment: 0-10 Pain Score: 2  Pain Descriptors / Indicators: Aching Pain Intervention(s): Monitored during session;Premedicated before session;Ice applied     Hand Dominance Right   Extremity/Trunk Assessment Upper Extremity Assessment Upper Extremity Assessment: Overall WFL for tasks assessed   Lower Extremity Assessment Lower Extremity Assessment: Defer to PT evaluation       Communication Communication Communication: No difficulties   Cognition Arousal/Alertness: Awake/alert  Behavior During Therapy: WFL for tasks assessed/performed Overall Cognitive Status: Within Functional Limits for tasks assessed                     General Comments       Exercises       Shoulder Instructions      Home Living Family/patient expects to be discharged to:: Private residence Living Arrangements: Alone Available Help at Discharge: Family;Available PRN/intermittently Type of Home: Mobile  home Home Access: Stairs to enter Entrance Stairs-Number of Steps: 3 STE (has used crutches to get in/out house in past) 8 steps to enter in front of house Entrance Stairs-Rails: Left Home Layout: One level     Bathroom Shower/Tub: Tub/shower unit (Will sponge bathe in beginning) Shower/tub characteristics: Curtain Firefighter: Standard Bathroom Accessibility: Yes   Home Equipment: Crutches          Prior Functioning/Environment Level of Independence: Independent             OT Diagnosis: Acute pain   OT Problem List: Pain;Decreased activity tolerance;Decreased knowledge of use of DME or AE;Decreased safety awareness   OT Treatment/Interventions: Self-care/ADL training;DME and/or AE instruction;Therapeutic activities;Patient/family education    OT Goals(Current goals can be found in the care plan section) Acute Rehab OT Goals Patient Stated Goal: To go home safely, don't rush it Time For Goal Achievement: 07/20/15 Potential to Achieve Goals: Good  OT Frequency: Min 2X/week   Barriers to D/C:            Co-evaluation              End of Session Equipment Utilized During Treatment: Gait belt;Rolling walker;Left knee immobilizer  Activity Tolerance: Patient tolerated treatment well Patient left: in chair;with call bell/phone within reach;with nursing/sitter in room   Time: 1610-9604 OT Time Calculation (min): 40 min Charges:  OT General Charges $OT Visit: 1 Procedure OT Evaluation $Initial OT Evaluation Tier I: 1 Procedure OT Treatments $Self Care/Home Management : 8-22 mins G-Codes:    Alm Bustard, OTR/L 07/13/2015, 9:47 AM

## 2015-07-13 NOTE — Discharge Instructions (Signed)
° °Dr. Frank Aluisio °Total Joint Specialist °[facility] °[address] °[place], [place] [address] °[phone] ° °TOTAL KNEE REPLACEMENT POSTOPERATIVE DIRECTIONS ° °Knee Rehabilitation, Guidelines Following Surgery  °Results after knee surgery are often greatly improved when you follow the exercise, range of motion and muscle strengthening exercises prescribed by your doctor. Safety measures are also important to protect the knee from further injury. Any time any of these exercises cause you to have increased pain or swelling in your knee joint, decrease the amount until you are comfortable again and slowly increase them. If you have problems or questions, call your caregiver or physical therapist for advice.  ° °HOME CARE INSTRUCTIONS  °Remove items at home which could result in a fall. This includes throw rugs or furniture in walking pathways.  °· ICE to the affected knee every three hours for 30 minutes at a time and then as needed for pain and swelling.  Continue to use ice on the knee for pain and swelling from surgery. You may notice swelling that will progress down to the foot and ankle.  This is normal after surgery.  Elevate the leg when you are not up walking on it.   °· Continue to use the breathing machine which will help keep your temperature down.  It is common for your temperature to cycle up and down following surgery, especially at night when you are not up moving around and exerting yourself.  The breathing machine keeps your lungs expanded and your temperature down. °· Do not place pillow under knee, focus on keeping the knee straight while resting ° °DIET °You may resume your previous home diet once your are discharged from the hospital. ° °DRESSING / WOUND CARE / SHOWERING °You may shower 3 days after surgery, but keep the wounds dry during showering.  You may use an occlusive plastic wrap (Press'n Seal for example), NO SOAKING/SUBMERGING IN THE BATHTUB.  If the  bandage gets wet, change with a clean dry gauze.  If the incision gets wet, pat the wound dry with a clean towel. °You may start showering once you are discharged home but do not submerge the incision under water. Just pat the incision dry and apply a dry gauze dressing on daily. °Change the surgical dressing daily and reapply a dry dressing each time. ° °ACTIVITY °Walk with your walker as instructed. °Use walker as long as suggested by your caregivers. °Avoid periods of inactivity such as sitting longer than an hour when not asleep. This helps prevent blood clots.  °You may resume a sexual relationship in one month or when given the OK by your doctor.  °You may return to work once you are cleared by your doctor.  °Do not drive a car for 6 weeks or until released by you surgeon.  °Do not drive while taking narcotics. ° °WEIGHT BEARING °Weight bearing as tolerated with assist device (walker, cane, etc) as directed, use it as long as suggested by your surgeon or therapist, typically at least 4-6 weeks. ° °POSTOPERATIVE CONSTIPATION PROTOCOL °Constipation - defined medically as fewer than three stools per week and severe constipation as less than one stool per week. ° °One of the most common issues patients have following surgery is constipation.  Even if you have a regular bowel pattern at home, your normal regimen is likely to be disrupted due to multiple reasons following surgery.  Combination of anesthesia, postoperative narcotics, change in appetite and fluid intake all can affect your bowels.    In order to avoid complications following surgery, here are some recommendations in order to help you during your recovery period. ° °Colace (docusate) - Pick up an over-the-counter form of Colace or another stool softener and take twice a day as long as you are requiring postoperative pain medications.  Take with a full glass of water daily.  If you experience loose stools or diarrhea, hold the colace until you stool forms  back up.  If your symptoms do not get better within 1 week or if they get worse, check with your doctor. ° °Dulcolax (bisacodyl) - Pick up over-the-counter and take as directed by the product packaging as needed to assist with the movement of your bowels.  Take with a full glass of water.  Use this product as needed if not relieved by Colace only.  ° °MiraLax (polyethylene glycol) - Pick up over-the-counter to have on hand.  MiraLax is a solution that will increase the amount of water in your bowels to assist with bowel movements.  Take as directed and can mix with a glass of water, juice, soda, coffee, or tea.  Take if you go more than two days without a movement. °Do not use MiraLax more than once per day. Call your doctor if you are still constipated or irregular after using this medication for 7 days in a row. ° °If you continue to have problems with postoperative constipation, please contact the office for further assistance and recommendations.  If you experience "the worst abdominal pain ever" or develop nausea or vomiting, please contact the office immediatly for further recommendations for treatment. ° °ITCHING ° If you experience itching with your medications, try taking only a single pain pill, or even half a pain pill at a time.  You can also use Benadryl over the counter for itching or also to help with sleep.  ° °TED HOSE STOCKINGS °Wear the elastic stockings on both legs for three weeks following surgery during the day but you may remove then at night for sleeping. ° °MEDICATIONS °See your medication summary on the “After Visit Summary” that the nursing staff will review with you prior to discharge.  You may have some home medications which will be placed on hold until you complete the course of blood thinner medication.  It is important for you to complete the blood thinner medication as prescribed by your surgeon.  Continue your approved medications as instructed at time of  discharge. ° °PRECAUTIONS °If you experience chest pain or shortness of breath - call 911 immediately for transfer to the hospital emergency department.  °If you develop a fever greater that 101 F, purulent drainage from wound, increased redness or drainage from wound, foul odor from the wound/dressing, or calf pain - CONTACT YOUR SURGEON.   °                                                °FOLLOW-UP APPOINTMENTS °Make sure you keep all of your appointments after your operation with your surgeon and caregivers. You should call the office at the above phone number and make an appointment for approximately two weeks after the date of your surgery or on the date instructed by your surgeon outlined in the "After Visit Summary". ° ° °RANGE OF MOTION AND STRENGTHENING EXERCISES  °Rehabilitation of the knee is important following a knee injury or   an operation. After just a few days of immobilization, the muscles of the thigh which control the knee become weakened and shrink (atrophy). Knee exercises are designed to build up the tone and strength of the thigh muscles and to improve knee motion. Often times heat used for twenty to thirty minutes before working out will loosen up your tissues and help with improving the range of motion but do not use heat for the first two weeks following surgery. These exercises can be done on a training (exercise) mat, on the floor, on a table or on a bed. Use what ever works the best and is most comfortable for you Knee exercises include:  Leg Lifts - While your knee is still immobilized in a splint or cast, you can do straight leg raises. Lift the leg to 60 degrees, hold for 3 sec, and slowly lower the leg. Repeat 10-20 times 2-3 times daily. Perform this exercise against resistance later as your knee gets better.  Quad and Hamstring Sets - Tighten up the muscle on the front of the thigh (Quad) and hold for 5-10 sec. Repeat this 10-20 times hourly. Hamstring sets are done by pushing the  foot backward against an object and holding for 5-10 sec. Repeat as with quad sets.   Leg Slides: Lying on your back, slowly slide your foot toward your buttocks, bending your knee up off the floor (only go as far as is comfortable). Then slowly slide your foot back down until your leg is flat on the floor again.  Angel Wings: Lying on your back spread your legs to the side as far apart as you can without causing discomfort.  A rehabilitation program following serious knee injuries can speed recovery and prevent re-injury in the future due to weakened muscles. Contact your doctor or a physical therapist for more information on knee rehabilitation.   IF YOU ARE TRANSFERRED TO A SKILLED REHAB FACILITY If the patient is transferred to a skilled rehab facility following release from the hospital, a list of the current medications will be sent to the facility for the patient to continue.  When discharged from the skilled rehab facility, please have the facility set up the patient's Home Health Physical Therapy prior to being released. Also, the skilled facility will be responsible for providing the patient with their medications at time of release from the facility to include their pain medication, the muscle relaxants, and their blood thinner medication. If the patient is still at the rehab facility at time of the two week follow up appointment, the skilled rehab facility will also need to assist the patient in arranging follow up appointment in our office and any transportation needs.  MAKE SURE YOU:  Understand these instructions.  Get help right away if you are not doing well or get worse.    Pick up stool softner and laxative for home use following surgery while on pain medications. Do not submerge incision under water. Please use good hand washing techniques while changing dressing each day. May shower starting three days after surgery. Please use a clean towel to pat the incision dry following  showers. Continue to use ice for pain and swelling after surgery. Do not use any lotions or creams on the incision until instructed by your surgeon.  Take Xarelto for a total of three weeks, then discontinue Xarelto. Once the patient has completed the blood thinner regimen, then take a Baby 81 mg Aspirin daily for three more weeks.

## 2015-07-13 NOTE — Discharge Summary (Signed)
Physician Discharge Summary   Patient ID: Jack [name] MRN: [id number] DOB/AGE: [birth year] 49 y.o.  Admit date: 07/12/2015 Discharge date: 07/14/2015  Primary Diagnosis:  Osteoarthritis Left knee(s)  Admission Diagnoses:  Past Medical History  Diagnosis Date  . Kidney stones [id number]  . Anxiety   . GERD (gastroesophageal reflux disease)   . Arthritis   . Headache     occasional    Discharge Diagnoses:   Principal Problem:   OA (osteoarthritis) of knee  Estimated body mass index is 31.33 kg/(m^2) as calculated from the following:   Height as of this encounter: _0  (1.727 m).   Weight as of this encounter: 93.441 kg (206 lb).  Procedure:  Procedure(s) (LRB): TOTAL LEFT  KNEE ARTHROPLASTY (Left)   Consults: None  HPI: Jack [name] is a 49 y.o. year old male with end stage OA of his left knee with progressively worsening pain and dysfunction. He has constant pain, with activity and at rest and significant functional deficits with difficulties even with ADLs. He has had extensive non-op management including analgesics, injections of cortisone, and home exercise program, but remains in significant pain with significant dysfunction. Radiographs show bone on bone arthritis medial and patellofemoral. He presents now for left Total Knee Arthroplasty.   Laboratory Data: Admission on 07/12/2015  Component Date Value Ref Range Status  . WBC 07/13/2015 22.2* 4.0 - 10.5 K/uL Final  . RBC 07/13/2015 4.36  4.22 - 5.81 MIL/uL Final  . Hemoglobin 07/13/2015 13.3  13.0 - 17.0 g/dL Final  . HCT 07/13/2015 38.6* 39.0 - 52.0 % Final  . MCV 07/13/2015 88.5  78.0 - 100.0 fL Final  . MCH 07/13/2015 30.5  26.0 - 34.0 pg Final  . MCHC 07/13/2015 34.5  30.0 - 36.0 g/dL Final  . RDW 07/13/2015 12.6  11.5 - 15.5 % Final  . Platelets 07/13/2015 260  150 - 400 K/uL Final  . Sodium 07/13/2015 139  135 - 145 mmol/L Final  . Potassium 07/13/2015 4.5  3.5 - 5.1 mmol/L Final  . Chloride 07/13/2015  103  101 - 111 mmol/L Final  . CO2 07/13/2015 28  22 - 32 mmol/L Final  . Glucose, Bld 07/13/2015 145* 65 - 99 mg/dL Final  . BUN 07/13/2015 9  6 - 20 mg/dL Final  . Creatinine, Ser 07/13/2015 0.92  0.61 - 1.24 mg/dL Final  . Calcium 07/13/2015 8.8* 8.9 - 10.3 mg/dL Final  . GFR calc non Af Amer 07/13/2015 >60  >60 mL/min Final  . GFR calc Af Amer 07/13/2015 >60  >60 mL/min Final   Comment: (NOTE) The eGFR has been calculated using the CKD EPI equation. This calculation has not been validated in all clinical situations. eGFR's persistently <60 mL/min signify possible Chronic Kidney Disease.   Georgiann Hahn gap 07/13/2015 8  5 - 15 Final  Hospital Outpatient Visit on 07/05/2015  Component Date Value Ref Range Status  . aPTT 07/05/2015 32  24 - 37 seconds Final  . WBC 07/05/2015 7.1  4.0 - 10.5 K/uL Final  . RBC 07/05/2015 4.95  4.22 - 5.81 MIL/uL Final  . Hemoglobin 07/05/2015 15.2  13.0 - 17.0 g/dL Final  . HCT 07/05/2015 43.7  39.0 - 52.0 % Final  . MCV 07/05/2015 88.3  78.0 - 100.0 fL Final  . MCH 07/05/2015 30.7  26.0 - 34.0 pg Final  . MCHC 07/05/2015 34.8  30.0 - 36.0 g/dL Final  . RDW 07/05/2015 12.6  11.5 - 15.5 % Final  .  Platelets 07/05/2015 247  150 - 400 K/uL Final  . Sodium 07/05/2015 138  135 - 145 mmol/L Final  . Potassium 07/05/2015 4.0  3.5 - 5.1 mmol/L Final  . Chloride 07/05/2015 105  101 - 111 mmol/L Final  . CO2 07/05/2015 26  22 - 32 mmol/L Final  . Glucose, Bld 07/05/2015 107* 65 - 99 mg/dL Final  . BUN 07/05/2015 12  6 - 20 mg/dL Final  . Creatinine, Ser 07/05/2015 0.86  0.61 - 1.24 mg/dL Final  . Calcium 07/05/2015 9.2  8.9 - 10.3 mg/dL Final  . Total Protein 07/05/2015 6.8  6.5 - 8.1 g/dL Final  . Albumin 07/05/2015 4.0  3.5 - 5.0 g/dL Final  . AST 07/05/2015 43* 15 - 41 U/L Final  . ALT 07/05/2015 59  17 - 63 U/L Final  . Alkaline Phosphatase 07/05/2015 85  38 - 126 U/L Final  . Total Bilirubin 07/05/2015 0.5  0.3 - 1.2 mg/dL Final  . GFR calc non Af Amer  07/05/2015 >60  >60 mL/min Final  . GFR calc Af Amer 07/05/2015 >60  >60 mL/min Final   Comment: (NOTE) The eGFR has been calculated using the CKD EPI equation. This calculation has not been validated in all clinical situations. eGFR's persistently <60 mL/min signify possible Chronic Kidney Disease.   . Anion gap 07/05/2015 7  5 - 15 Final  . Prothrombin Time 07/05/2015 13.8  11.6 - 15.2 seconds Final  . INR 07/05/2015 1.04  0.00 - 1.49 Final  . ABO/RH(D) 07/05/2015 O NEG   Final  . Antibody Screen 07/05/2015 NEG   Final  . Sample Expiration 07/05/2015 07/15/2015   Final  . Color, Urine 07/05/2015 YELLOW  YELLOW Final  . APPearance 07/05/2015 CLEAR  CLEAR Final  . Specific Gravity, Urine 07/05/2015 1.014  1.005 - 1.030 Final  . pH 07/05/2015 8.0  5.0 - 8.0 Final  . Glucose, UA 07/05/2015 NEGATIVE  NEGATIVE mg/dL Final  . Hgb urine dipstick 07/05/2015 NEGATIVE  NEGATIVE Final  . Bilirubin Urine 07/05/2015 NEGATIVE  NEGATIVE Final  . Ketones, ur 07/05/2015 NEGATIVE  NEGATIVE mg/dL Final  . Protein, ur 07/05/2015 NEGATIVE  NEGATIVE mg/dL Final  . Urobilinogen, UA 07/05/2015 0.2  0.0 - 1.0 mg/dL Final  . Nitrite 07/05/2015 NEGATIVE  NEGATIVE Final  . Leukocytes, UA 07/05/2015 NEGATIVE  NEGATIVE Final   MICROSCOPIC NOT DONE ON URINES WITH NEGATIVE PROTEIN, BLOOD, LEUKOCYTES, NITRITE, OR GLUCOSE <1000 mg/dL.  Marland Kitchen MRSA, PCR 07/05/2015 NEGATIVE  NEGATIVE Final  . Staphylococcus aureus 07/05/2015 POSITIVE* NEGATIVE Final   Comment:        The Xpert SA Assay (FDA approved for NASAL specimens in patients over 29 years of age), is one component of a comprehensive surveillance program.  Test performance has been validated by [facility] for patients greater than or equal to 54 year old. It is not intended to diagnose infection nor to guide or monitor treatment.   . ABO/RH(D) 07/05/2015 O NEG   Final  Office Visit on 06/17/2015  Component Date Value Ref Range Status  . WBC 06/17/2015 8.1   3.4 - 10.8 x10E3/uL Final  . RBC 06/17/2015 4.87  4.14 - 5.80 x10E6/uL Final  . Hemoglobin 06/17/2015 14.7  12.6 - 17.7 g/dL Final  . Hematocrit 06/17/2015 44.3  37.5 - 51.0 % Final  . MCV 06/17/2015 91  79 - 97 fL Final  . MCH 06/17/2015 30.2  26.6 - 33.0 pg Final  . MCHC 06/17/2015 33.2  31.5 -  35.7 g/dL Final  . RDW 06/17/2015 14.2  12.3 - 15.4 % Final  . Platelets 06/17/2015 276  150 - 379 x10E3/uL Final  . Neutrophils 06/17/2015 68   Final  . Lymphs 06/17/2015 18   Final  . Monocytes 06/17/2015 7   Final  . Eos 06/17/2015 6   Final  . Basos 06/17/2015 1   Final  . Neutrophils Absolute 06/17/2015 5.5  1.4 - 7.0 x10E3/uL Final  . Lymphocytes Absolute 06/17/2015 1.5  0.7 - 3.1 x10E3/uL Final  . Monocytes Absolute 06/17/2015 0.5  0.1 - 0.9 x10E3/uL Final  . EOS (ABSOLUTE) 06/17/2015 0.5* 0.0 - 0.4 x10E3/uL Final  . Basophils Absolute 06/17/2015 0.1  0.0 - 0.2 x10E3/uL Final  . Immature Granulocytes 06/17/2015 0   Final  . Immature Grans (Abs) 06/17/2015 0.0  0.0 - 0.1 x10E3/uL Final  . Glucose 06/17/2015 95  65 - 99 mg/dL Final  . BUN 06/17/2015 10  6 - 24 mg/dL Final  . Creatinine, Ser 06/17/2015 0.99  0.76 - 1.27 mg/dL Final  . GFR calc non Af Amer 06/17/2015 90  >59 mL/min/1.73 Final  . GFR calc Af Amer 06/17/2015 104  >59 mL/min/1.73 Final  . BUN/Creatinine Ratio 06/17/2015 10  9 - 20 Final  . Sodium 06/17/2015 143  134 - 144 mmol/L Final  . Potassium 06/17/2015 4.3  3.5 - 5.2 mmol/L Final  . Chloride 06/17/2015 103  97 - 108 mmol/L Final  . CO2 06/17/2015 27  18 - 29 mmol/L Final  . Calcium 06/17/2015 9.0  8.7 - 10.2 mg/dL Final  . Total Protein 06/17/2015 6.3  6.0 - 8.5 g/dL Final  . Albumin 06/17/2015 4.2  3.5 - 5.5 g/dL Final  . Globulin, Total 06/17/2015 2.1  1.5 - 4.5 g/dL Final  . Albumin/Globulin Ratio 06/17/2015 2.0  1.1 - 2.5 Final  . Bilirubin Total 06/17/2015 0.3  0.0 - 1.2 mg/dL Final  . Alkaline Phosphatase 06/17/2015 92  39 - 117 IU/L Final  . AST 06/17/2015  42* 0 - 40 IU/L Final  . ALT 06/17/2015 48* 0 - 44 IU/L Final     X-Rays:Dg Chest 2 View  06/18/2015   CLINICAL DATA:  Preoperative total knee replacement  EXAM: CHEST  2 VIEW  COMPARISON:  None.  FINDINGS: There is an apparent nipple shadow on the left. There is no edema or consolidation. The heart size and pulmonary vascularity are normal. No adenopathy. There is mild degenerative change in the thoracic spine. There is a screw in the right shoulder region, incompletely visualized.  IMPRESSION: Probable nipple shadow on the left ; repeat study with nipple markers to confirm would be advisable. No edema or consolidation.   Electronically Signed   By: Lowella Grip III M.D.   On: 06/18/2015 07:59    EKG: Orders placed or performed in visit on 06/17/15  . EKG 12-Lead     Hospital Course: Jack [name] is a 49 y.o. who was admitted to [facility]. They were brought to the operating room on 07/12/2015 and underwent Procedure(s): TOTAL LEFT  KNEE ARTHROPLASTY.  Patient tolerated the procedure well and was later transferred to the recovery room and then to the orthopaedic floor for postoperative care.  They were given PO and IV analgesics for pain control following their surgery.  They were given 24 hours of postoperative antibiotics of  Anti-infectives    Start     Dose/Rate Route Frequency Ordered Stop   07/12/15 1800  ceFAZolin (ANCEF)  IVPB 2 g/50 mL premix     2 g 100 mL/hr over 30 Minutes Intravenous Every 6 hours 07/12/15 1446 07/13/15 0015   07/12/15 0836  ceFAZolin (ANCEF) IVPB 2 g/50 mL premix     2 g 100 mL/hr over 30 Minutes Intravenous On call to O.R. 07/12/15 [id number] 07/12/15 1142     and started on DVT prophylaxis in the form of Xarelto.   PT and OT were ordered for total joint protocol.  Discharge planning consulted to help with postop disposition and equipment needs.  Patient had a good night on the evening of surgery.  They started to get up OOB with therapy on day one.  Hemovac drain was pulled without difficulty.  Continued to work with therapy into day two.  Dressing was changed on day two and the incision was healing well. Patient was seen in rounds and was ready to go home.  Discharge home with home health Diet - Regular diet Follow up - in 2 weeks Activity - WBAT Disposition - Home Condition Upon Discharge - Good D/C Meds - See DC Summary DVT Prophylaxis - Xarelto  Discharge Instructions    Call MD / Call 911    Complete by:  As directed   If you experience chest pain or shortness of breath, CALL 911 and be transported to the hospital emergency room.  If you develope a fever above 101 F, pus (white drainage) or increased drainage or redness at the wound, or calf pain, call your surgeon's office.     Change dressing    Complete by:  As directed   Change dressing daily with sterile 4 x 4 inch gauze dressing and apply TED hose. Do not submerge the incision under water.     Constipation Prevention    Complete by:  As directed   Drink plenty of fluids.  Prune juice may be helpful.  You may use a stool softener, such as Colace (over the counter) 100 mg twice a day.  Use MiraLax (over the counter) for constipation as needed.     Diet general    Complete by:  As directed      Discharge instructions    Complete by:  As directed   Pick up stool softner and laxative for home use following surgery while on pain medications. Do not submerge incision under water. May remove the surgical dressing on Wednesday 07/14/2015 and then apply a dry gauze dressing daily to the incision. Please use good hand washing techniques while changing dressing each day. May shower starting three days after surgery on Thursday 07/15/2015. Please use a clean towel to pat the incision dry following showers. Continue to use ice for pain and swelling after surgery. Do not use any lotions or creams on the incision until instructed by your surgeon.  Take Xarelto for a total of three  weeks, then discontinue Xarelto. Once the patient has completed the blood thinner regimen, then take a Baby 81 mg Aspirin daily for three more weeks.  Postoperative Constipation Protocol  Constipation - defined medically as fewer than three stools per week and severe constipation as less than one stool per week.  One of the most common issues patients have following surgery is constipation.  Even if you have a regular bowel pattern at home, your normal regimen is likely to be disrupted due to multiple reasons following surgery.  Combination of anesthesia, postoperative narcotics, change in appetite and fluid intake all can affect your bowels.  In order to  avoid complications following surgery, here are some recommendations in order to help you during your recovery period.  Colace (docusate) - Pick up an over-the-counter form of Colace or another stool softener and take twice a day as long as you are requiring postoperative pain medications.  Take with a full glass of water daily.  If you experience loose stools or diarrhea, hold the colace until you stool forms back up.  If your symptoms do not get better within 1 week or if they get worse, check with your doctor.  Dulcolax (bisacodyl) - Pick up over-the-counter and take as directed by the product packaging as needed to assist with the movement of your bowels.  Take with a full glass of water.  Use this product as needed if not relieved by Colace only.   MiraLax (polyethylene glycol) - Pick up over-the-counter to have on hand.  MiraLax is a solution that will increase the amount of water in your bowels to assist with bowel movements.  Take as directed and can mix with a glass of water, juice, soda, coffee, or tea.  Take if you go more than two days without a movement. Do not use MiraLax more than once per day. Call your doctor if you are still constipated or irregular after using this medication for 7 days in a row.  If you continue to have problems  with postoperative constipation, please contact the office for further assistance and recommendations.  If you experience "the worst abdominal pain ever" or develop nausea or vomiting, please contact the office immediatly for further recommendations for treatment.     Do not put a pillow under the knee. Place it under the heel.    Complete by:  As directed      Do not sit on low chairs, stoools or toilet seats, as it may be difficult to get up from low surfaces    Complete by:  As directed      Driving restrictions    Complete by:  As directed   No driving until released by the physician.     Increase activity slowly as tolerated    Complete by:  As directed      Lifting restrictions    Complete by:  As directed   No lifting until released by the physician.     Patient may shower    Complete by:  As directed   You may shower without a dressing once there is no drainage.  Do not wash over the wound.  If drainage remains, do not shower until drainage stops.     TED hose    Complete by:  As directed   Use stockings (TED hose) for 3 weeks on both leg(s).  You may remove them at night for sleeping.     Weight bearing as tolerated    Complete by:  As directed   Laterality:  left  Extremity:  Lower            Medication List    STOP taking these medications        ibuprofen 200 MG tablet  Commonly known as:  ADVIL,MOTRIN     multivitamin with minerals Tabs tablet     naproxen sodium 220 MG tablet  Commonly known as:  ANAPROX     Vitamin D (Ergocalciferol) 50000 UNITS Caps capsule  Commonly known as:  DRISDOL      TAKE these medications        methocarbamol 500 MG tablet  Commonly  known as:  ROBAXIN  Take 1 tablet (500 mg total) by mouth every 6 (six) hours as needed for muscle spasms.     oxyCODONE 5 MG immediate release tablet  Commonly known as:  Oxy IR/ROXICODONE  Take 1-2 tablets (5-10 mg total) by mouth every 3 (three) hours as needed for moderate pain or severe  pain.     rivaroxaban 10 MG Tabs tablet  Commonly known as:  XARELTO  Take 1 tablet (10 mg total) by mouth daily with breakfast. Take Xarelto for a total of three weeks, then discontinue Xarelto. Once the patient has completed the blood thinner regimen, then take a Baby 81 mg Aspirin daily for three more weeks.           Follow-up Information    Follow up with Gearlean Alf, MD. Schedule an appointment as soon as possible for a visit on 07/27/2015.   Specialty:  Orthopedic Surgery   Why:  Call office at [phone] to setup appointment of Tuesday 07/27/2015 with Dr. Wynelle Link.   Contact information:   [address] [place] [address] [phone]       Signed: Arlee Muslim, PA-C Orthopaedic Surgery 07/13/2015, 8:00 AM

## 2015-07-13 NOTE — Care Management Note (Addendum)
Case Management Note  Patient Details  Name: Jack [name] MRN: [id number] Date of Birth: 21-Nov-1965  Subjective/Objective:                   TOTAL LEFT KNEE ARTHROPLASTY (Left) Action/Plan:  Discharge planning Expected Discharge Date:  07/14/15              Expected Discharge Plan:  [place]  In-House Referral:     Discharge planning Services  CM Consult  Post Acute Care Choice:  Home Health Choice offered to:  Patient  DME Arranged:  N/A DME Agency:  NA  HH Arranged:  PT HH Agency:  [facility]  Status of Service:  Completed, signed off  Medicare Important Message Given:    Date Medicare IM Given:    Medicare IM give by:    Date Additional Medicare IM Given:    Additional Medicare Important Message give by:     If discussed at [place] of Stay Meetings, dates discussed:    Additional Comments: Utilization Review complete. CM met with pt in room to offer choice of home health agency.  Pt chooses [facility] to render HHPT.  Address and contact information verified by pt.  Referral called to [facility] rep.  Pt is borrowing both a rolling walker and commode from family members.  No other CM needs were communicated. Dellie Catholic, RN 07/13/2015, 11:42 AM

## 2015-07-13 NOTE — Progress Notes (Signed)
   Subjective: 1 Day Post-Op Procedure(s) (LRB): TOTAL LEFT  KNEE ARTHROPLASTY (Left) Patient reports pain as mild.   Patient seen in rounds with Dr. Lequita Halt. Patient is well, but has had some minor complaints of pain in the knee, requiring pain medications but overall he feels good today following surgery. We will start therapy today.  Plan is to go Home after hospital stay.Discussed that if he does very well with therapy and pain stays well controlled, then may allow him home later this afternoon if he meets all his therapy goals.  Objective: Vital signs in last 24 hours: Temp:  [97.4 F (36.3 C)-98.6 F (37 C)] 98 F (36.7 C) (10/04 0522) Pulse Rate:  [50-86] 64 (10/04 0522) Resp:  [11-19] 16 (10/04 0522) BP: (114-152)/(69-121) 116/74 mmHg (10/04 0522) SpO2:  [98 %-100 %] 99 % (10/04 0522) Weight:  [93.441 kg (206 lb)] 93.441 kg (206 lb) (10/03 0904)  Intake/Output from previous day:  Intake/Output Summary (Last 24 hours) at 07/13/15 0746 Last data filed at 07/13/15 0522  Gross per 24 hour  Intake   2820 ml  Output   2645 ml  Net    175 ml    Intake/Output this shift: UOP 1175 since around [place]  Labs:  Recent Labs  07/13/15 0500  HGB 13.3    Recent Labs  07/13/15 0500  WBC 22.2*  RBC 4.36  HCT 38.6*  PLT 260    Recent Labs  07/13/15 0500  NA 139  K 4.5  CL 103  CO2 28  BUN 9  CREATININE 0.92  GLUCOSE 145*  CALCIUM 8.8*   No results for input(s): LABPT, INR in the last 72 hours.  EXAM General - Patient is Alert, Appropriate and Oriented Extremity - Neurovascular intact Sensation intact distally Dressing - dressing C/D/I Motor Function - intact, moving foot and toes well on exam.  Hemovac pulled without difficulty.  Past Medical History  Diagnosis Date  . Kidney stones [id number]  . Anxiety   . GERD (gastroesophageal reflux disease)   . Arthritis   . Headache     occasional     Assessment/Plan: 1 Day Post-Op Procedure(s) (LRB): TOTAL  LEFT  KNEE ARTHROPLASTY (Left) Principal Problem:   OA (osteoarthritis) of knee  Estimated body mass index is 31.33 kg/(m^2) as calculated from the following:   Height as of this encounter:  (1.727 m).   Weight as of this encounter: 93.441 kg (206 lb). Advance diet Up with therapy Discharge home with home health  DVT Prophylaxis - Xarelto Weight-Bearing as tolerated to left leg D/C O2 and Pulse OX and try on Room Air  Possible Home This Afternoon Diet - Regular diet Follow up - in 2 weeks Activity - WBAT Disposition - Home Condition Upon Discharge - Pending at this time D/C Meds - See DC Summary DVT Prophylaxis - Xarelto  Avel Peace, PA-C Orthopaedic Surgery 07/13/2015, 7:46 AM

## 2015-07-13 NOTE — Progress Notes (Signed)
Physical Therapy Treatment Patient Details Name: Jack [name] MRN: [id number] DOB: 01-02-1966 Today's Date: 07/13/2015    History of Present Illness 49 yo male s/p L TKA 07/12/15    PT Comments    Progressing with mobility. Increased pain this pm ~5/10.   Follow Up Recommendations  Home health PT     Equipment Recommendations  None recommended by PT (pt states he will borrow equipment)    Recommendations for Other Services       Precautions / Restrictions Precautions Precautions: Knee Precaution Comments: cautioned pt against6 keeping knee in flexed position at rest Required Braces or Orthoses: Knee Immobilizer - Left Knee Immobilizer - Left: Discontinue once straight leg raise with < 10 degree lag Restrictions Weight Bearing Restrictions: No LLE Weight Bearing: Weight bearing as tolerated    Mobility  Bed Mobility Overal bed mobility: Needs Assistance Bed Mobility: Sit to Supine       Sit to supine: Supervision   General bed mobility comments: pt hooks L LE and uses R leg to lift onto bed  Transfers Overall transfer level: Needs assistance Equipment used: Rolling walker (2 wheeled) Transfers: Sit to/from Stand Sit to Stand: Min guard         General transfer comment: close guard for safety. VCs safety, technqiue, hand placement  Ambulation/Gait Ambulation/Gait assistance: Min guard Ambulation Distance (Feet): 165 Feet Assistive device: Rolling walker (2 wheeled) Gait Pattern/deviations: Step-to pattern;Antalgic     General Gait Details: close guard for safety. VCs safety, sequence. close guard for safety   Stairs            Wheelchair Mobility    Modified Rankin (Stroke Patients Only)       Balance                                    Cognition Arousal/Alertness: Awake/alert Behavior During Therapy: WFL for tasks assessed/performed Overall Cognitive Status: Within Functional Limits for tasks assessed                       Exercises      General Comments        Pertinent Vitals/Pain Pain Assessment: 0-10 Pain Score: 3  Pain Location: L knee Pain Descriptors / Indicators: Aching;Sore Pain Intervention(s): Ice applied;Monitored during session;Repositioned    Home Living                      Prior Function            PT Goals (current goals can now be found in the care plan section) Acute Rehab PT Goals Patient Stated Goal: To go home safely, don't rush it PT Goal Formulation: With patient Time For Goal Achievement: 07/20/15 Potential to Achieve Goals: Good Progress towards PT goals: Progressing toward goals    Frequency  7X/week    PT Plan Current plan remains appropriate    Co-evaluation             End of Session Equipment Utilized During Treatment: Gait belt Activity Tolerance: Patient tolerated treatment well Patient left: in bed;with call bell/phone within reach;with bed alarm set     Time: 1441-1501 PT Time Calculation (min) (ACUTE ONLY): 20 min  Charges:  $Gait Training: 8-22 mins $Therapeutic Exercise: 8-22 mins                    G Codes:  Weston Anna, MPT Pager: [phone]

## 2015-07-13 NOTE — Evaluation (Addendum)
Physical Therapy Evaluation Patient Details Name: Jack [name] MRN: [id number] DOB: [birth year] Today's Date: 07/13/2015   History of Present Illness  49 yo male s/p L TKA 07/12/15  Clinical Impression  On eval, pt required Min guard assist for mobility-walked ~115 feet with RW. Pain rated 3/10. Pt states he plans to stay until tomorrow.     Follow Up Recommendations Home health PT    Equipment Recommendations  Rolling walker with 5" wheels    Recommendations for Other Services       Precautions / Restrictions Precautions Precautions: Knee Required Braces or Orthoses: Knee Immobilizer - Left Knee Immobilizer - Left: Discontinue once straight leg raise with < 10 degree lag Restrictions Weight Bearing Restrictions: No LLE Weight Bearing: Weight bearing as tolerated      Mobility  Bed Mobility Overal bed mobility: Modified Independent             General bed mobility comments: pt oob in recliner  Transfers Overall transfer level: Needs assistance Equipment used: Rolling walker (2 wheeled) Transfers: Sit to/from Stand Sit to Stand: Min guard Stand pivot transfers: Min guard       General transfer comment: close guard for safety. VCs safety, technqiue, hand placement  Ambulation/Gait Ambulation/Gait assistance: Min guard Ambulation Distance (Feet): 115 Feet Assistive device: Rolling walker (2 wheeled) Gait Pattern/deviations: Step-to pattern;Antalgic     General Gait Details: close guard for safety. VCs safety, sequence. close guard for safety  Stairs            Wheelchair Mobility    Modified Rankin (Stroke Patients Only)       Balance                                             Pertinent Vitals/Pain Pain Assessment: 0-10 Pain Score: 3  Pain Location: L knee Pain Descriptors / Indicators: Sore;Aching Pain Intervention(s): Monitored during session;Ice applied;Repositioned    Home Living                        Prior Function                 Hand Dominance        Extremity/Trunk Assessment   Upper Extremity Assessment: Defer to OT evaluation           Lower Extremity Assessment: LLE deficits/detail   LLE Deficits / Details: hip flex 3-/5, moves ankle well  Cervical / Trunk Assessment: Normal  Communication      Cognition Arousal/Alertness: Awake/alert Behavior During Therapy: WFL for tasks assessed/performed Overall Cognitive Status: Within Functional Limits for tasks assessed                      General Comments      Exercises  APs, 10 reps, Active Quad sets, 10 reps, active Hip Abd, 10 reps, active SLR, 10 reps, active-assist Knee flexion, 10 reps, active assist, seated      Assessment/Plan    PT Assessment Patient needs continued PT services  PT Diagnosis Difficulty walking;Acute pain   PT Problem List Decreased strength;Decreased range of motion;Decreased activity tolerance;Decreased balance;Decreased mobility;Decreased knowledge of use of DME;Pain  PT Treatment Interventions DME instruction;Gait training;Stair training;Functional mobility training;Therapeutic activities;Patient/family education;Balance training;Therapeutic exercise   PT Goals (Current goals can be found in the Care Plan section) Acute Rehab PT  Goals Patient Stated Goal: To go home safely, don't rush it PT Goal Formulation: With patient Time For Goal Achievement: 07/20/15 Potential to Achieve Goals: Good    Frequency 7X/week   Barriers to discharge        Co-evaluation               End of Session Equipment Utilized During Treatment: Gait belt Activity Tolerance: Patient tolerated treatment well Patient left: in chair;with call bell/phone within reach;with family/visitor present           Time: 1610-9604 PT Time Calculation (min) (ACUTE ONLY): 26 min   Charges:   PT Evaluation $Initial PT Evaluation Tier I: 1 Procedure PT Treatments $Therapeutic  Exercise: 8-22 mins   PT G Codes:        Rebeca Alert, MPT Pager: [phone]

## 2015-07-14 LAB — BASIC METABOLIC PANEL
Anion gap: 6 (ref 5–15)
BUN: 11 mg/dL (ref 6–20)
CALCIUM: 9.1 mg/dL (ref 8.9–10.3)
CO2: 31 mmol/L (ref 22–32)
CREATININE: 0.83 mg/dL (ref 0.61–1.24)
Chloride: 103 mmol/L (ref 101–111)
GFR calc Af Amer: >60 mL/min (ref >60)
GFR calc non Af Amer: >60 mL/min (ref >60)
GLUCOSE: 131 mg/dL — AB (ref 65–99)
Potassium: 4.3 mmol/L (ref 3.5–5.1)
Sodium: 140 mmol/L (ref 135–145)

## 2015-07-14 LAB — CBC
HEMATOCRIT: 37.2 % — AB (ref 39.0–52.0)
Hemoglobin: 12.6 g/dL — ABNORMAL LOW (ref 13.0–17.0)
MCH: 30.6 pg (ref 26.0–34.0)
MCHC: 33.9 g/dL (ref 30.0–36.0)
MCV: 90.3 fL (ref 78.0–100.0)
Platelets: 271 10*3/uL (ref 150–400)
RBC: 4.12 MIL/uL — ABNORMAL LOW (ref 4.22–5.81)
RDW: 13 % (ref 11.5–15.5)
WBC: 24.1 10*3/uL — ABNORMAL HIGH (ref 4.0–10.5)

## 2015-07-14 NOTE — Progress Notes (Signed)
   Subjective: 2 Days Post-Op Procedure(s) (LRB): TOTAL LEFT  KNEE ARTHROPLASTY (Left) Patient reports pain as mild.   Patient seen in rounds for Dr. Lequita Halt. Patient is well, and has had no acute complaints or problems Patient is ready to go home  Objective: Vital signs in last 24 hours: Temp:  [97.7 F (36.5 C)-98.2 F (36.8 C)] 98.2 F (36.8 C) (10/05 0540) Pulse Rate:  [71-80] 71 (10/05 0540) Resp:  [16-18] 16 (10/05 0540) BP: (123-138)/(67-89) 135/77 mmHg (10/05 0540) SpO2:  [100 %] 100 % (10/05 0540)  Intake/Output from previous day:  Intake/Output Summary (Last 24 hours) at 07/14/15 0722 Last data filed at 07/14/15 0541  Gross per 24 hour  Intake 2327.33 ml  Output   3150 ml  Net -822.67 ml    Labs:  Recent Labs  07/13/15 0500 07/14/15 0511  HGB 13.3 12.6*    Recent Labs  07/13/15 0500 07/14/15 0511  WBC 22.2* 24.1*  RBC 4.36 4.12*  HCT 38.6* 37.2*  PLT 260 271    Recent Labs  07/13/15 0500 07/14/15 0511  NA 139 140  K 4.5 4.3  CL 103 103  CO2 28 31  BUN 9 11  CREATININE 0.92 0.83  GLUCOSE 145* 131*  CALCIUM 8.8* 9.1   No results for input(s): LABPT, INR in the last 72 hours.  EXAM: General - Patient is Alert, Appropriate and Oriented Extremity - Neurovascular intact Sensation intact distally Incision - clean, dry, no drainage, healing Motor Function - intact, moving foot and toes well on exam.   Assessment/Plan: 2 Days Post-Op Procedure(s) (LRB): TOTAL LEFT  KNEE ARTHROPLASTY (Left) Procedure(s) (LRB): TOTAL LEFT  KNEE ARTHROPLASTY (Left) Past Medical History  Diagnosis Date  . Kidney stones [id number]  . Anxiety   . GERD (gastroesophageal reflux disease)   . Arthritis   . Headache     occasional    Principal Problem:   OA (osteoarthritis) of knee  Estimated body mass index is 31.33 kg/(m^2) as calculated from the following:   Height as of this encounter:  (1.727 m).   Weight as of this encounter: 93.441 kg (206  lb). Up with therapy Discharge home with home health Diet - Regular diet Follow up - in 2 weeks Activity - WBAT Disposition - Home Condition Upon Discharge - Good D/C Meds - See DC Summary DVT Prophylaxis - Xarelto  Avel Peace, PA-C Orthopaedic Surgery 07/14/2015, 7:22 AM

## 2015-07-14 NOTE — Progress Notes (Signed)
Occupational Therapy Treatment Patient Details Name: Jack [name] MRN: [id number] DOB: 1966/06/09 Today's Date: 07/14/2015    History of present illness 49 yo male s/p L TKA 07/12/15   OT comments  Pt plans to DC this day. Education complete  Follow Up Recommendations  No OT follow up    Equipment Recommendations  Other (comment)    Recommendations for Other Services      Precautions / Restrictions Precautions Precautions: Knee Required Braces or Orthoses: Knee Immobilizer - Left Knee Immobilizer - Left: Discontinue once straight leg raise with < 10 degree lag Restrictions Weight Bearing Restrictions: No LLE Weight Bearing: Weight bearing as tolerated       Mobility Bed Mobility Overal bed mobility: Modified Independent Bed Mobility: Supine to Sit     Supine to sit: Modified independent (Device/Increase time)     General bed mobility comments: pt hooks L LE and uses R leg to lift off bed  Transfers Overall transfer level: Needs assistance Equipment used: Rolling walker (2 wheeled) Transfers: Sit to/from Stand Sit to Stand: Supervision Stand pivot transfers: Supervision       General transfer comment: close guard for safety. VCs safety, technqiue, hand placement    Balance                                   ADL Overall ADL's : Needs assistance/impaired                                       General ADL Comments: Pt educated on use of tub bench.  Pt does not feel he needs one at this time but does know it is an option.  Girlfriend and mother will A as needed.  Pt has a LH shoe horn. Does not feel he needs other AE      Vision                     Perception     Praxis      Cognition   Behavior During Therapy: [facility] for tasks assessed/performed Overall Cognitive Status: Within Functional Limits for tasks assessed                       Extremity/Trunk Assessment               Exercises Total  Joint Exercises Ankle Circles/Pumps: AROM;Both;10 reps Quad Sets: AROM;Both;10 reps Heel Slides: AAROM;Left;10 reps Straight Leg Raises: AAROM;Left;10 reps   Shoulder Instructions       General Comments      Pertinent Vitals/ Pain       Pain Assessment: 0-10 Pain Score: 3  Pain Location: l knee Pain Descriptors / Indicators: Sore Pain Intervention(s): Monitored during session  Home Living                                          Prior Functioning/Environment              Frequency Min 2X/week     Progress Toward Goals  OT Goals(current goals can now be found in the care plan section)  Progress towards OT goals: Progressing toward goals     Plan Discharge plan remains appropriate  Co-evaluation                 End of Session Equipment Utilized During Treatment: Rolling walker;Left knee immobilizer CPM Left Knee CPM Left Knee: Off   Activity Tolerance Patient tolerated treatment well   Patient Left in chair;with call bell/phone within reach;with nursing/sitter in room   Nurse Communication Mobility status        Time: 1478-2956 OT Time Calculation (min): 8 min  Charges: OT General Charges $OT Visit: 1 Procedure OT Treatments $Self Care/Home Management : 8-22 mins  Shatha Hooser D 07/14/2015, 11:28 AM

## 2015-07-14 NOTE — Progress Notes (Signed)
Physical Therapy Treatment Patient Details Name: Jack [name] MRN: [id number] DOB: [birth year] Today's Date: 07/14/2015    History of Present Illness 49 yo male s/p L TKA 07/12/15    PT Comments    Pt progressing well, verbalizes stair technique and does not feel the need to practice  Follow Up Recommendations  Home health PT     Equipment Recommendations  None recommended by PT    Recommendations for Other Services       Precautions / Restrictions Precautions Precautions: Knee Required Braces or Orthoses: Knee Immobilizer - Left Knee Immobilizer - Left: Discontinue once straight leg raise with < 10 degree lag Restrictions Weight Bearing Restrictions: No LLE Weight Bearing: Weight bearing as tolerated    Mobility  Bed Mobility Overal bed mobility: Modified Independent Bed Mobility: Supine to Sit     Supine to sit: Modified independent (Device/Increase time)     General bed mobility comments: pt hooks L LE and uses R leg to lift off bed  Transfers Overall transfer level: Needs assistance Equipment used: Rolling walker (2 wheeled) Transfers: Sit to/from Stand Sit to Stand: Supervision         General transfer comment: close guard for safety. VCs safety, technqiue, hand placement  Ambulation/Gait Ambulation/Gait assistance: Supervision;Modified independent (Device/Increase time) Ambulation Distance (Feet): 180 Feet Assistive device: Rolling walker (2 wheeled) Gait Pattern/deviations: Step-to pattern;Step-through pattern;Antalgic     General Gait Details: close guard for safety. VCs safety, sequence   Stairs            Wheelchair Mobility    Modified Rankin (Stroke Patients Only)       Balance                                    Cognition Arousal/Alertness: Awake/alert Behavior During Therapy: WFL for tasks assessed/performed Overall Cognitive Status: Within Functional Limits for tasks assessed                      Exercises Total Joint Exercises Ankle Circles/Pumps: AROM;Both;10 reps Quad Sets: AROM;Both;10 reps Heel Slides: AAROM;Left;10 reps Straight Leg Raises: AAROM;Left;10 reps    General Comments        Pertinent Vitals/Pain Pain Assessment: 0-10 Pain Score: 3  Pain Location: L knee Pain Descriptors / Indicators: Aching;Sore Pain Intervention(s): Limited activity within patient's tolerance;Monitored during session    Home Living                      Prior Function            PT Goals (current goals can now be found in the care plan section) Acute Rehab PT Goals PT Goal Formulation: With patient Time For Goal Achievement: 07/20/15 Potential to Achieve Goals: Good Progress towards PT goals: Progressing toward goals    Frequency  7X/week    PT Plan Current plan remains appropriate    Co-evaluation             End of Session Equipment Utilized During Treatment: Left knee immobilizer;Gait belt Activity Tolerance: Patient tolerated treatment well Patient left: in chair;with call bell/phone within reach     Time: 0911-0941 PT Time Calculation (min) (ACUTE ONLY): 30 min  Charges:  $Gait Training: 8-22 mins $Therapeutic Exercise: 8-22 mins                    G Codes:  [facility] 07/14/2015, 9:42 AM

## 2015-07-27 ENCOUNTER — Ambulatory Visit: Payer: PRIVATE HEALTH INSURANCE | Attending: Orthopedic Surgery | Admitting: Physical Therapy

## 2015-07-27 DIAGNOSIS — M25662 Stiffness of left knee, not elsewhere classified: Secondary | ICD-10-CM | POA: Diagnosis present

## 2015-07-27 DIAGNOSIS — M25562 Pain in left knee: Secondary | ICD-10-CM | POA: Diagnosis present

## 2015-07-27 NOTE — Therapy (Signed)
[facility] [address] [place], [place], [address] Phone: [phone]   Fax:  [phone]  Physical Therapy Evaluation  Patient Details  Name: Jack [name] MRN: [id number] Date of Birth: 1965/11/09 Referring Provider: Ollen GrossFrank Aluisio MD.  Encounter Date: 07/27/2015      PT End of Session - 07/27/15 1803    Visit Number 1   Number of Visits 12   Date for PT Re-Evaluation 09/14/15   PT Start Time 1357   PT Stop Time 1447   PT Time Calculation (min) 50 min   Activity Tolerance Patient tolerated treatment well   Behavior During Therapy [facility] for tasks assessed/performed      Past Medical History  Diagnosis Date  . Kidney stones [id number]  . Anxiety   . GERD (gastroesophageal reflux disease)   . Arthritis   . Headache     occasional     Past Surgical History  Procedure Laterality Date  . Tonsillectomy and adenoidectomy    . Tubes in ears    . Knee surgery Left     3 arthroscopic,  two ACL reconstuctions  . Shoulder surgery Right     dislocation repair with a pin  . 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  Visit Diagnosis:  Left knee pain - Plan: PT plan of care cert/re-cert  Knee stiffness, left - Plan: PT plan of care cert/re-cert      Subjective Assessment - 07/27/15 1807    Subjective I want to progress slowly and don't want to be hurt.   Patient Stated Goals Get out of pain.   Pain Score 2    Pain Location Knee   Pain Orientation Left   Pain Descriptors / Indicators Aching   Pain Type Surgical pain   Pain Frequency Constant            [facility] PT Assessment - 07/27/15 0001    Assessment   Medical Diagnosis Left total knee replacment.   Referring Provider Ollen GrossFrank Aluisio MD.   Onset Date/Surgical Date --  07/12/15 (surgery).   Precautions   Precautions --  No ultrasound.   Restrictions   Weight Bearing Restrictions No   Balance Screen   Has the patient fallen in the past 6 months No   Has the patient had a decrease in activity level because of a fear of falling?  No   Is the patient reluctant to leave their home because of a fear of falling?  No   Home [employer] manag[facility]   Living Environment Private residence   Prior Function   Level of Independence Independent   Observation/Other Assessments-Edema    Edema Circumferential   Circumferential Edema   Circumferential - Left  LT 7 cms > RT.   ROM / Strength   AROM / PROM / Strength AROM;PROM;Strength   AROM   Overall AROM Comments -15 degrees to 60 degrees.   PROM   Overall PROM Comments -12 degrees to 65 degrees.   Strength   Overall Strength Comments Left hip strength= 3-/5 and left knee strength= 4-/5.   Palpation   Palpation comment --  Diffuse left knee pain.   Ambulation/Gait   Gait Comments Antalgic gait pattern with one axillary crutches.  [facility] Adult PT Treatment/Exercise - 07/27/15 0001    Modalities   Modalities Electrical Stimulation;Vasopneumatic   Electrical Stimulation   Electrical Stimulation Location Left knee   Electrical Stimulation Action 1-10 HZ IFC x 15 minutes.   Electrical Stimulation Goals Edema;Pain   Vasopneumatic   Number Minutes Vasopneumatic  15 minutes   Vasopnuematic Location  --  Left knee.   Vasopneumatic Pressure Medium                     PT Long Term Goals - 07/27/15 1815    PT LONG TERM GOAL #1   Title Ind with HEP.   Time 4   Period Weeks   Status New   PT LONG TERM GOAL #2   Title Full active left knee extension.   Time 4   Period Weeks   Status New   PT LONG TERM GOAL #3   Title Active left knee flexion to 115 degrees+ so the patient can perform functional tasks and do so with pain not > 2-3/10.   Time 4   Period Weeks   Status New   PT LONG TERM GOAL #4    Title Decrease edema to within 2 cms of non-affected side to assist with pain reduction and range of motion gains.   Time 4   Period Weeks   Status New   PT LONG TERM GOAL #5   Title Perform a reciprocating stair gait with one railing with pain not > 2-3/10.               Plan - 07/27/15 1808    Clinical Impression Statement The patient underwent a left total knee replacement on 07/12/15.  He reports a low pain-level at rest (1-2/10) but higher (7+/10) with movement of his left knee.  Patient is compliant to his HEP.   Pt will benefit from skilled therapeutic intervention in order to improve on the following deficits Pain;Decreased activity tolerance;Increased edema;Decreased strength;Decreased range of motion   Rehab Potential Good   PT Frequency 3x / week   PT Duration 4 weeks   PT Treatment/Interventions ADLs/Self Care Home Management;Cryotherapy;Electrical Stimulation;Therapeutic activities;Therapeutic exercise;Manual techniques;Patient/family education;Passive range of motion;Vasopneumatic Device   PT Next Visit Plan TKA protocol.   Consulted and Agree with Plan of Care Patient         Problem List Patient Active Problem List   Diagnosis Date Noted  . OA (osteoarthritis) of knee 07/12/2015  . Vitamin D deficiency 04/15/2015    Jack [name], Jack [name] 07/27/2015, 6:22 PM  [facility] [address] [place], [place], [address] Phone: [phone]   Fax:  [phone]  Name: Jack [name] MRN: [id number] Date of Birth: [birth year]

## 2015-07-29 ENCOUNTER — Ambulatory Visit: Payer: PRIVATE HEALTH INSURANCE | Admitting: Physical Therapy

## 2015-07-29 DIAGNOSIS — M25662 Stiffness of left knee, not elsewhere classified: Secondary | ICD-10-CM

## 2015-07-29 DIAGNOSIS — M25562 Pain in left knee: Secondary | ICD-10-CM

## 2015-07-29 NOTE — Therapy (Signed)
[facility] [address] [place], [place], [address] Phone: [phone]   Fax:  [phone]  Physical Therapy Treatment  Patient Details  Name: Jack [name] MRN: [id number] Date of Birth: [birth year] Referring Provider: Ollen GrossFrank Aluisio MD.  Encounter Date: 07/29/2015      PT End of Session - 07/29/15 1123    Visit Number 2   Number of Visits 12   Date for PT Re-Evaluation 09/14/15   PT Start Time 1028   PT Stop Time 1125   PT Time Calculation (min) 57 min   Equipment Utilized During Treatment Left knee immobilizer;Gait belt   Activity Tolerance Patient tolerated treatment well   Behavior During Therapy [facility] for tasks assessed/performed      Past Medical History  Diagnosis Date  . Kidney stones [id number]  . Anxiety   . GERD (gastroesophageal reflux disease)   . Arthritis   . Headache     occasional     Past Surgical History  Procedure Laterality Date  . Tonsillectomy and adenoidectomy    . Tubes in ears    . Knee surgery Left     3 arthroscopic,  two ACL reconstuctions  . Shoulder surgery Right     dislocation repair with a pin  . 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  Visit Diagnosis:  Left knee pain  Knee stiffness, left                                    PT Long Term Goals - 07/27/15 1815    PT LONG TERM GOAL #1   Title Ind with HEP.   Time 4   Period Weeks   Status New   PT LONG TERM GOAL #2   Title Full active left knee extension.   Time 4   Period Weeks   Status New   PT LONG TERM GOAL #3   Title Active left knee flexion to 115 degrees+ so the patient can perform functional tasks and do so with pain not > 2-3/10.   Time 4   Period Weeks   Status New   PT LONG TERM GOAL #4   Title Decrease  edema to within 2 cms of non-affected side to assist with pain reduction and range of motion gains.   Time 4   Period Weeks   Status New   PT LONG TERM GOAL #5   Title Perform a reciprocating stair gait with one railing with pain not > 2-3/10.               Problem List Patient Active Problem List   Diagnosis Date Noted  . OA (osteoarthritis) of knee 07/12/2015  . Vitamin D deficiency 04/15/2015   Treatment:  Nustep level 2 starting at seat 11 and moving forward to seat 10 over 23 minutes f/b PROM in supine to patien's left knee into flexion and extension x 7 minutes and review of HEP:  Prone hang; seated left knee flex and extension and seated extension stretch with 5#.  IFC and medium vasopneumatic to 15 minutes to patient's left knee.  Kyren Vaux, [place] MPT 07/29/2015, 11:30 AM  [facility] [address] [place], [place], [address] Phone: [phone]  Fax:  [phone]  Name: Jack [name] MRN: [id number] Date of Birth: March 15, 1966

## 2015-08-02 ENCOUNTER — Encounter: Payer: PRIVATE HEALTH INSURANCE | Admitting: Physical Therapy

## 2015-08-03 ENCOUNTER — Encounter: Payer: Self-pay | Admitting: *Deleted

## 2015-08-03 ENCOUNTER — Ambulatory Visit: Payer: PRIVATE HEALTH INSURANCE | Admitting: *Deleted

## 2015-08-03 DIAGNOSIS — M25562 Pain in left knee: Secondary | ICD-10-CM

## 2015-08-03 DIAGNOSIS — M25662 Stiffness of left knee, not elsewhere classified: Secondary | ICD-10-CM

## 2015-08-03 NOTE — Therapy (Signed)
[facility] [address] [place], [place], [address] Phone: [phone]   Fax:  [phone]  Physical Therapy Treatment  Patient Details  Name: Jack [name] MRN: [id number] Date of Birth: [birth year] Referring Provider: Ollen GrossFrank Aluisio MD.  Encounter Date: 08/03/2015      PT End of Session - 08/03/15 0955    Visit Number 3   Number of Visits 12   Date for PT Re-Evaluation 09/14/15   PT Start Time 0945   PT Stop Time 1038   PT Time Calculation (min) 53 min      Past Medical History  Diagnosis Date  . Kidney stones [id number]  . Anxiety   . GERD (gastroesophageal reflux disease)   . Arthritis   . Headache     occasional     Past Surgical History  Procedure Laterality Date  . Tonsillectomy and adenoidectomy    . Tubes in ears    . Knee surgery Left     3 arthroscopic,  two ACL reconstuctions  . Shoulder surgery Right     dislocation repair with a pin  . 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  Visit Diagnosis:  Left knee pain  Knee stiffness, left      Subjective Assessment - 08/03/15 0957    Subjective LT knee stays swollen and feels like it's gonna pop   Patient Stated Goals Get out of pain.   Currently in Pain? Yes   Pain Score 3    Pain Location Knee   Pain Orientation Left   Pain Descriptors / Indicators Aching   Pain Type Surgical pain   Pain Onset 1 to 4 weeks ago   Pain Frequency Constant   Aggravating Factors  swelling   Pain Relieving Factors ice, e-stim                         [facility] Adult PT Treatment/Exercise - 08/03/15 0001    Exercises   Exercises Knee/Hip   Knee/Hip Exercises: Aerobic   Nustep L3 x 20 min for ROM progression seat 10,9   Modalities   Modalities Electrical  Stimulation;Vasopneumatic   Electrical Stimulation   Electrical Stimulation Location LT knee IFC x 15 mins 1-10 hz   Electrical Stimulation Goals Edema;Pain   Vasopneumatic   Number Minutes Vasopneumatic  15 minutes   Vasopnuematic Location  Knee   Vasopneumatic Pressure Medium   Vasopneumatic Temperature  34   Manual Therapy   Manual Therapy Passive ROM;Myofascial release   Myofascial Release IASTm around incision and LT knee to help with swelling and ROM                     PT Long Term Goals - 07/27/15 1815    PT LONG TERM GOAL #1   Title Ind with HEP.   Time 4   Period Weeks   Status New   PT LONG TERM GOAL #2   Title Full active left knee extension.   Time 4   Period Weeks   Status New   PT LONG TERM GOAL #3   Title Active left knee flexion to 115 degrees+ so the patient can perform functional tasks and do so with pain not > 2-3/10.   Time 4   Period  Weeks   Status New   PT LONG TERM GOAL #4   Title Decrease edema to within 2 cms of non-affected side to assist with pain reduction and range of motion gains.   Time 4   Period Weeks   Status New   PT LONG TERM GOAL #5   Title Perform a reciprocating stair gait with one railing with pain not > 2-3/10.               Plan - 08/03/15 0955    Clinical Impression Statement The pt did fairly well with Rx today and continues to have swelling in LT knee. Pt is still somewhat apprehensive when I performed STW and PROM. Patella was difficult to palpate due to swelling   Pt will benefit from skilled therapeutic intervention in order to improve on the following deficits Pain;Decreased activity tolerance;Increased edema;Decreased strength;Decreased range of motion   Rehab Potential Good   PT Frequency 3x / week   PT Duration 4 weeks   PT Treatment/Interventions ADLs/Self Care Home Management;Cryotherapy;Electrical Stimulation;Therapeutic activities;Therapeutic exercise;Manual techniques;Patient/family  education;Passive range of motion;Vasopneumatic Device   PT Next Visit Plan Perform VMS to left quadriceps.   Consulted and Agree with Plan of Care Patient        Problem List Patient Active Problem List   Diagnosis Date Noted  . OA (osteoarthritis) of knee 07/12/2015  . Vitamin D deficiency 04/15/2015    RAMSEUR,CHRIS, PTA 08/03/2015, 10:48 AM  [facility] [address] [place], [place], [address] Phone: [phone]   Fax:  [phone]  Name: Jack [name] MRN: [id number] Date of Birth: Aug 14, 1966

## 2015-08-05 ENCOUNTER — Ambulatory Visit: Payer: PRIVATE HEALTH INSURANCE | Admitting: *Deleted

## 2015-08-05 ENCOUNTER — Encounter: Payer: Self-pay | Admitting: *Deleted

## 2015-08-05 DIAGNOSIS — M25562 Pain in left knee: Secondary | ICD-10-CM

## 2015-08-05 DIAGNOSIS — M25662 Stiffness of left knee, not elsewhere classified: Secondary | ICD-10-CM

## 2015-08-05 NOTE — Therapy (Signed)
[facility] [address] [place], [place], [address] Phone: [phone]   Fax:  [phone]  Physical Therapy Treatment  Patient Details  Name: Jack [name] MRN: [id number] Date of Birth: 22-Jun-1966 Referring Provider: Ollen GrossFrank Aluisio MD.  Encounter Date: 08/05/2015      PT End of Session - 08/05/15 1320    Visit Number 4   Number of Visits 12   Date for PT Re-Evaluation 09/14/15   PT Start Time 0945   PT Stop Time 1044   PT Time Calculation (min) 59 min      Past Medical History  Diagnosis Date  . Kidney stones [id number]  . Anxiety   . GERD (gastroesophageal reflux disease)   . Arthritis   . Headache     occasional     Past Surgical History  Procedure Laterality Date  . Tonsillectomy and adenoidectomy    . Tubes in ears    . Knee surgery Left     3 arthroscopic,  two ACL reconstuctions  . Shoulder surgery Right     dislocation repair with a pin  . 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  Visit Diagnosis:  Left knee pain  Knee stiffness, left      Subjective Assessment - 08/05/15 0950    Subjective LT knee stays swollen and feels like it's gonna pop   Patient Stated Goals Get out of pain.   Currently in Pain? Yes   Pain Score 3    Pain Location Knee   Pain Orientation Left   Pain Descriptors / Indicators Aching   Pain Type Surgical pain   Pain Onset 1 to 4 weeks ago   Pain Frequency Constant   Aggravating Factors  swelling   Pain Relieving Factors ice,estim                         [facility] Adult PT Treatment/Exercise - 08/05/15 0001    Exercises   Exercises Knee/Hip   Knee/Hip Exercises: Aerobic   Nustep L3 x 20 min for ROM progression seat 10,9   Knee/Hip Exercises: Standing   Knee Flexion Left;1 set;10  reps  hold 30 seconds   Modalities   Modalities Electrical Stimulation;Vasopneumatic   Electrical Stimulation   Electrical Stimulation Location LT knee IFC x 15 mins 1-10 hz   Electrical Stimulation Goals Edema;Pain   Vasopneumatic   Number Minutes Vasopneumatic  15 minutes   Vasopnuematic Location  Knee   Vasopneumatic Pressure Medium   Vasopneumatic Temperature  34   Manual Therapy   Manual Therapy Passive ROM;Myofascial release   Myofascial Release IASTm around incision and LT knee to help with swelling and ROM   Passive ROM PROM for flexion with Pt supine. Low load end-range                     PT Long Term Goals - 07/27/15 1815    PT LONG TERM GOAL #1   Title Ind with HEP.   Time 4   Period Weeks   Status New   PT LONG TERM GOAL #2   Title Full active left knee extension.   Time 4   Period Weeks   Status New   PT LONG TERM GOAL #3   Title Active  left knee flexion to 115 degrees+ so the patient can perform functional tasks and do so with pain not > 2-3/10.   Time 4   Period Weeks   Status New   PT LONG TERM GOAL #4   Title Decrease edema to within 2 cms of non-affected side to assist with pain reduction and range of motion gains.   Time 4   Period Weeks   Status New   PT LONG TERM GOAL #5   Title Perform a reciprocating stair gait with one railing with pain not > 2-3/10.               Plan - 08/05/15 1321    Clinical Impression Statement Pt did fairly well with todays Rx, but continues to have swelling in RT knee and his PROM is limited to 80-85 degrees. He continues to be a little apprehensive with PROM due to a past experience. I encouraged Pt to work on flexion lunges at home several times a day to increase ROM.   Pt will benefit from skilled therapeutic intervention in order to improve on the following deficits Pain;Decreased activity tolerance;Increased edema;Decreased strength;Decreased range of motion   Rehab Potential Good   PT  Frequency 3x / week   PT Duration 4 weeks   PT Treatment/Interventions ADLs/Self Care Home Management;Cryotherapy;Electrical Stimulation;Therapeutic activities;Therapeutic exercise;Manual techniques;Patient/family education;Passive range of motion;Vasopneumatic Device   PT Next Visit Plan Perform VMS to left quadriceps.PROM as tolerated   Consulted and Agree with Plan of Care Patient        Problem List Patient Active Problem List   Diagnosis Date Noted  . OA (osteoarthritis) of knee 07/12/2015  . Vitamin D deficiency 04/15/2015    Adilynne Fitzwater,CHRIS, PTA 08/05/2015, 1:31 PM  [facility] [address] [place], [place], [address] Phone: [phone]   Fax:  [phone]  Name: Jack [name] MRN: [id number] Date of Birth: 1966-03-02

## 2015-08-10 ENCOUNTER — Ambulatory Visit: Payer: PRIVATE HEALTH INSURANCE | Attending: Orthopedic Surgery | Admitting: *Deleted

## 2015-08-10 ENCOUNTER — Encounter: Payer: Self-pay | Admitting: *Deleted

## 2015-08-10 DIAGNOSIS — M25562 Pain in left knee: Secondary | ICD-10-CM | POA: Insufficient documentation

## 2015-08-10 DIAGNOSIS — M25662 Stiffness of left knee, not elsewhere classified: Secondary | ICD-10-CM | POA: Insufficient documentation

## 2015-08-10 NOTE — Therapy (Signed)
[facility] [address] [place], [place], [address] Phone: [phone]   Fax:  [phone]  Physical Therapy Treatment  Patient Details  Name: Jack [name] MRN: [id number] Date of Birth: [birth year] Referring Provider: Ollen GrossFrank Aluisio MD.  Encounter Date: 08/10/2015      PT End of Session - 08/10/15 1005    Visit Number 5   Number of Visits 12   Date for PT Re-Evaluation 09/14/15   PT Start Time 0945   PT Stop Time 1044   PT Time Calculation (min) 59 min      Past Medical History  Diagnosis Date  . Kidney stones [id number]  . Anxiety   . GERD (gastroesophageal reflux disease)   . Arthritis   . Headache     occasional     Past Surgical History  Procedure Laterality Date  . Tonsillectomy and adenoidectomy    . Tubes in ears    . Knee surgery Left     3 arthroscopic,  two ACL reconstuctions  . Shoulder surgery Right     dislocation repair with a pin  . 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  Visit Diagnosis:  Left knee pain  Knee stiffness, left      Subjective Assessment - 08/10/15 0958    Subjective LT knee stays swollen and feels like it's gonna pop. I did a lot of walking yesterday and it swollen most of  the day   Patient Stated Goals Get out of pain.   Currently in Pain? Yes   Pain Score 3    Pain Location Knee   Pain Descriptors / Indicators Aching   Pain Type Surgical pain   Pain Onset 1 to 4 weeks ago   Pain Frequency Constant   Aggravating Factors  swelling   Pain Relieving Factors ice,estim                         [facility] Adult PT Treatment/Exercise - 08/10/15 0001    Exercises   Exercises Knee/Hip   Knee/Hip Exercises: Aerobic   Nustep L3 x 20 min for ROM progression seat 10,9   Knee/Hip Exercises:  Standing   Knee Flexion Left;1 set;10 reps  hold 30 seconds   Modalities   Modalities Electrical Stimulation;Vasopneumatic   Electrical Stimulation   Electrical Stimulation Location LT knee IFC x 15 mins 1-10 hz   Electrical Stimulation Goals Edema;Pain   Vasopneumatic   Number Minutes Vasopneumatic  15 minutes   Vasopnuematic Location  Knee   Vasopneumatic Pressure Medium   Vasopneumatic Temperature  34   Manual Therapy   Manual Therapy Passive ROM;Myofascial release   Myofascial Release IASTm around incision and LT knee to help with swelling and ROM   Passive ROM PROM for flexion with Pt supine. Low load end-range                     PT Long Term Goals - 07/27/15 1815    PT LONG TERM GOAL #1   Title Ind with HEP.   Time 4   Period Weeks   Status New   PT LONG TERM GOAL #2   Title Full active left knee extension.   Time 4   Period Weeks   Status New  PT LONG TERM GOAL #3   Title Active left knee flexion to 115 degrees+ so the patient can perform functional tasks and do so with pain not > 2-3/10.   Time 4   Period Weeks   Status New   PT LONG TERM GOAL #4   Title Decrease edema to within 2 cms of non-affected side to assist with pain reduction and range of motion gains.   Time 4   Period Weeks   Status New   PT LONG TERM GOAL #5   Title Perform a reciprocating stair gait with one railing with pain not > 2-3/10.               Plan - 08/10/15 1547    Clinical Impression Statement Pt did fairly well today with Rx, but ROM is about the same. His PROM for flexion was 85 degrees in lunge position on 14 in box. His incision looked much better today and scar mobs were tolerated well. His incision was less mobile towards distal aspect. Swelling was about the same as last Rx. Goals are on-going   Pt will benefit from skilled therapeutic intervention in order to improve on the following deficits Pain;Decreased activity tolerance;Increased edema;Decreased  strength;Decreased range of motion   Rehab Potential Good   PT Frequency 3x / week   PT Duration 4 weeks   PT Treatment/Interventions ADLs/Self Care Home Management;Cryotherapy;Electrical Stimulation;Therapeutic activities;Therapeutic exercise;Manual techniques;Patient/family education;Passive range of motion;Vasopneumatic Device   PT Next Visit Plan Perform VMS to left quadriceps.PROM as tolerated   Consulted and Agree with Plan of Care Patient        Problem List Patient Active Problem List   Diagnosis Date Noted  . OA (osteoarthritis) of knee 07/12/2015  . Vitamin D deficiency 04/15/2015    Jack [name],Jack [name], PTA 08/10/2015, 3:59 PM  [facility] [address] [place], [place], [address] Phone: [phone]   Fax:  [phone]  Name: Jack [name] MRN: [id number] Date of Birth: 1966-04-10

## 2015-08-12 ENCOUNTER — Encounter: Payer: PRIVATE HEALTH INSURANCE | Admitting: *Deleted

## 2015-08-13 ENCOUNTER — Ambulatory Visit: Payer: PRIVATE HEALTH INSURANCE | Admitting: Physical Therapy

## 2015-08-13 DIAGNOSIS — M25562 Pain in left knee: Secondary | ICD-10-CM | POA: Diagnosis not present

## 2015-08-13 DIAGNOSIS — M25662 Stiffness of left knee, not elsewhere classified: Secondary | ICD-10-CM

## 2015-08-13 NOTE — Therapy (Signed)
[facility] [address] [place], [place], [address] Phone: [phone]   Fax:  [phone]  Physical Therapy Treatment  Patient Details  Name: Jack [name] MRN: [id number] Date of Birth: Aug 25, 1966 Referring Provider: Ollen Gross MD.  Encounter Date: 08/13/2015      PT End of Session - 08/13/15 1220    Visit Number 6   Number of Visits 12   Date for PT Re-Evaluation 09/14/15   PT Start Time 1115   PT Stop Time 1212   PT Time Calculation (min) 57 min   Equipment Utilized During Treatment Left knee immobilizer;Gait belt   Activity Tolerance Patient tolerated treatment well   Behavior During Therapy [facility] for tasks assessed/performed      Past Medical History  Diagnosis Date  . Kidney stones [id number]  . Anxiety   . GERD (gastroesophageal reflux disease)   . Arthritis   . Headache     occasional     Past Surgical History  Procedure Laterality Date  . Tonsillectomy and adenoidectomy    . Tubes in ears    . Knee surgery Left     3 arthroscopic,  two ACL reconstuctions  . Shoulder surgery Right     dislocation repair with a pin  . Total knee arthroplasty Left 07/12/2015    Procedure: TOTAL LEFT  KNEE ARTHROPLASTY;  Surgeon: Ollen Gross, MD;  Location: [facility];  Service: Orthopedics;  Laterality: Left;    There were no vitals filed for this visit.  Visit Diagnosis:  Left knee pain  Knee stiffness, left          [facility] PT Assessment - 08/13/15 0001    PROM   Overall PROM Comments -12 degrees of passive extension and active left knee flexion= 90 degrees and passive= 95 degrees.                                  PT Long Term Goals - 07/27/15 1815    PT LONG TERM GOAL #1   Title Ind with HEP.   Time 4   Period Weeks   Status New   PT LONG TERM GOAL #2   Title Full active left knee extension.   Time 4   Period Weeks   Status New   PT LONG TERM GOAL #3   Title Active left knee  flexion to 115 degrees+ so the patient can perform functional tasks and do so with pain not > 2-3/10.   Time 4   Period Weeks   Status New   PT LONG TERM GOAL #4   Title Decrease edema to within 2 cms of non-affected side to assist with pain reduction and range of motion gains.   Time 4   Period Weeks   Status New   PT LONG TERM GOAL #5   Title Perform a reciprocating stair gait with one railing with pain not > 2-3/10.               Plan - 08/13/15 1224    Clinical Impression Statement Patient progressing slowly.  His CC is that of swelling and he is in fear of pushing his knee too hard.        Problem List Patient Active Problem List   Diagnosis Date Noted  . OA (osteoarthritis) of knee 07/12/2015  . Vitamin D deficiency 04/15/2015   Treatment:  Nustep level 4 x 20 minutes f/b 14"  box lunges (3 minutes) f/b PROM into left knee flexion and extension (6 minutes) f/b medium vasopneumatic x 15 minutes.    Loy Mccartt, [place] MPT 08/13/2015, 12:27 PM  [facility] [address] [place], [place], [address] Phone: [phone]   Fax:  [phone]  Name: Jack [name] MRN: [id number] Date of Birth: July 11, 1966

## 2015-08-17 ENCOUNTER — Ambulatory Visit: Payer: PRIVATE HEALTH INSURANCE | Admitting: Physical Therapy

## 2015-08-17 DIAGNOSIS — M25562 Pain in left knee: Secondary | ICD-10-CM | POA: Diagnosis not present

## 2015-08-17 DIAGNOSIS — M25662 Stiffness of left knee, not elsewhere classified: Secondary | ICD-10-CM

## 2015-08-17 NOTE — Therapy (Signed)
[facility] [address] [place], [place], [address] Phone: [phone]   Fax:  [phone]  Physical Therapy Treatment  Patient Details  Name: Jack [name] MRN: [id number] Date of Birth: 22-Feb-1966 Referring Provider: Ollen GrossFrank Aluisio MD.  Encounter Date: 08/17/2015      PT End of Session - 08/17/15 1619    Visit Number 7   Number of Visits 12   Date for PT Re-Evaluation 09/14/15   PT Start Time 0400      Past Medical History  Diagnosis Date  . Kidney stones [id number]  . Anxiety   . GERD (gastroesophageal reflux disease)   . Arthritis   . Headache     occasional     Past Surgical History  Procedure Laterality Date  . Tonsillectomy and adenoidectomy    . Tubes in ears    . Knee surgery Left     3 arthroscopic,  two ACL reconstuctions  . Shoulder surgery Right     dislocation repair with a pin  . 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  Visit Diagnosis:  Left knee pain  Knee stiffness, left                                    PT Long Term Goals - 07/27/15 1815    PT LONG TERM GOAL #1   Title Ind with HEP.   Time 4   Period Weeks   Status New   PT LONG TERM GOAL #2   Title Full active left knee extension.   Time 4   Period Weeks   Status New   PT LONG TERM GOAL #3   Title Active left knee flexion to 115 degrees+ so the patient can perform functional tasks and do so with pain not > 2-3/10.   Time 4   Period Weeks   Status New   PT LONG TERM GOAL #4   Title Decrease edema to within 2 cms of non-affected side to assist with pain reduction and range of motion gains.   Time 4   Period Weeks   Status New   PT LONG TERM GOAL #5   Title Perform a reciprocating stair gait with one railing with pain not >  2-3/10.               Problem List Patient Active Problem List   Diagnosis Date Noted  . OA (osteoarthritis) of knee 07/12/2015  . Vitamin D deficiency 04/15/2015   Treatment:  Staionary bike at seat 8 x 12 minutes f/b Rockerboard x 7 minutes f/b 12 box lunges f/b left knee PROM in supine x 7 minutes f/b IFC x 20 minutes to left knee. Priscella Donna, [place] MPT 08/17/2015, 5:32 PM  [facility] [address] [place], [place], [address] Phone: [phone]   Fax:  [phone]  Name: Jack [name] MRN: [id number] Date of Birth: 22-Feb-1966

## 2015-08-19 ENCOUNTER — Encounter: Payer: Self-pay | Admitting: *Deleted

## 2015-08-19 ENCOUNTER — Ambulatory Visit: Payer: PRIVATE HEALTH INSURANCE | Admitting: *Deleted

## 2015-08-19 DIAGNOSIS — M25562 Pain in left knee: Secondary | ICD-10-CM | POA: Diagnosis not present

## 2015-08-19 DIAGNOSIS — M25662 Stiffness of left knee, not elsewhere classified: Secondary | ICD-10-CM

## 2015-08-19 NOTE — Therapy (Signed)
[facility] [address] [place], [place], [address] Phone: [phone]   Fax:  [phone]  Physical Therapy Treatment  Patient Details  Name: Jack [name] MRN: [id number] Date of Birth: 12-25-1965 Referring Provider: Ollen GrossFrank Aluisio MD.  Encounter Date: 08/19/2015      PT End of Session - 08/19/15 1032    Visit Number 8   Number of Visits 12   Date for PT Re-Evaluation 09/14/15   PT Start Time 0946   PT Stop Time 1044   PT Time Calculation (min) 58 min   Equipment Utilized During Treatment Left knee immobilizer;Gait belt   Activity Tolerance Patient tolerated treatment well   Behavior During Therapy [facility] for tasks assessed/performed      Past Medical History  Diagnosis Date  . Kidney stones [id number]  . Anxiety   . GERD (gastroesophageal reflux disease)   . Arthritis   . Headache     occasional     Past Surgical History  Procedure Laterality Date  . Tonsillectomy and adenoidectomy    . Tubes in ears    . Knee surgery Left     3 arthroscopic,  two ACL reconstuctions  . Shoulder surgery Right     dislocation repair with a pin  . 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  Visit Diagnosis:  Left knee pain  Knee stiffness, left      Subjective Assessment - 08/19/15 0949    Subjective LT knee stays swollen and feels like it's gonna pop. I did a lot of walking yesterday and it swollen most of  the day. Really swollen today for some reason   Patient Stated Goals Get out of pain.   Currently in Pain? Yes   Pain Score 3    Pain Location Knee   Pain Orientation Left   Pain Descriptors / Indicators Aching   Pain Type Surgical pain   Pain Onset 1 to 4 weeks ago   Pain Frequency Constant   Aggravating Factors  swelling   Pain Relieving  Factors ice, estim                         [facility] Adult PT Treatment/Exercise - 08/19/15 0001    Exercises   Exercises Knee/Hip   Knee/Hip Exercises: Aerobic   Stationary Bike L 0, x 8 mins   Nustep L3 x 10 min for ROM progression seat 10,9   Knee/Hip Exercises: Standing   Knee Flexion Left;1 set;10 reps  hold 30 seconds   Rocker Board 3 minutes  calf stretching   Modalities   Modalities Electrical Stimulation;Vasopneumatic   Electrical Stimulation   Electrical Stimulation Location LT knee IFC x 15 mins 1-10 hz   Electrical Stimulation Goals Edema;Pain   Vasopneumatic   Number Minutes Vasopneumatic  15 minutes   Vasopnuematic Location  Knee   Vasopneumatic Pressure Medium   Vasopneumatic Temperature  34   Manual Therapy   Manual Therapy Passive ROM;Myofascial release   Myofascial Release IASTm around incision and LT knee to help with swelling and ROM   Passive ROM PROM for flexion with Pt supine. Low load end-range                     PT Long Term Goals -  07/27/15 1815    PT LONG TERM GOAL #1   Title Ind with HEP.   Time 4   Period Weeks   Status New   PT LONG TERM GOAL #2   Title Full active left knee extension.   Time 4   Period Weeks   Status New   PT LONG TERM GOAL #3   Title Active left knee flexion to 115 degrees+ so the patient can perform functional tasks and do so with pain not > 2-3/10.   Time 4   Period Weeks   Status New   PT LONG TERM GOAL #4   Title Decrease edema to within 2 cms of non-affected side to assist with pain reduction and range of motion gains.   Time 4   Period Weeks   Status New   PT LONG TERM GOAL #5   Title Perform a reciprocating stair gait with one railing with pain not > 2-3/10.               Plan - 08/19/15 1132    Clinical Impression Statement Pt did fairly well today, but had less flexion ROM and more swelling in LT knee. He was only able to get 85 degrees PROM today. Goals are ongoing    Pt will benefit from skilled therapeutic intervention in order to improve on the following deficits Pain;Decreased activity tolerance;Increased edema;Decreased strength;Decreased range of motion   Rehab Potential Good   PT Frequency 3x / week   PT Duration 4 weeks   PT Next Visit Plan PROM TKR protocol, keep working on Flexion   Consulted and Agree with Plan of Care Patient        Problem List Patient Active Problem List   Diagnosis Date Noted  . OA (osteoarthritis) of knee 07/12/2015  . Vitamin D deficiency 04/15/2015    Shyam Dawson,CHRIS, PTA 08/19/2015, 2:20 PM  [facility] [address] [place], [place], [address] Phone: [phone]   Fax:  [phone]  Name: Jack [name] MRN: [id number] Date of Birth: May 03, 1966

## 2015-08-24 ENCOUNTER — Encounter: Payer: Self-pay | Admitting: Physical Therapy

## 2015-08-24 ENCOUNTER — Ambulatory Visit: Payer: PRIVATE HEALTH INSURANCE | Admitting: Physical Therapy

## 2015-08-24 DIAGNOSIS — M25662 Stiffness of left knee, not elsewhere classified: Secondary | ICD-10-CM

## 2015-08-24 DIAGNOSIS — M25562 Pain in left knee: Secondary | ICD-10-CM

## 2015-08-24 NOTE — Therapy (Signed)
[facility] [address] [place], [place], [address] Phone: [phone]   Fax:  [phone]  Physical Therapy Treatment  Patient Details  Name: ABDIKADIR FOHL MRN: [id number] Date of Birth: 09-Jun-1966 Referring Provider: Ollen Gross MD.  Encounter Date: 08/24/2015      PT End of Session - 08/24/15 0819    Visit Number 9   Number of Visits 12   Date for PT Re-Evaluation 09/14/15   PT Start Time 0817   PT Stop Time 0914   PT Time Calculation (min) 57 min   Activity Tolerance Patient tolerated treatment well   Behavior During Therapy [facility] for tasks assessed/performed      Past Medical History  Diagnosis Date  . Kidney stones [id number]  . Anxiety   . GERD (gastroesophageal reflux disease)   . Arthritis   . Headache     occasional     Past Surgical History  Procedure Laterality Date  . Tonsillectomy and adenoidectomy    . Tubes in ears    . Knee surgery Left     3 arthroscopic,  two ACL reconstuctions  . Shoulder surgery Right     dislocation repair with a pin  . Total knee arthroplasty Left 07/12/2015    Procedure: TOTAL LEFT  KNEE ARTHROPLASTY;  Surgeon: Ollen Gross, MD;  Location: [facility];  Service: Orthopedics;  Laterality: Left;    There were no vitals filed for this visit.  Visit Diagnosis:  Left knee pain  Knee stiffness, left      Subjective Assessment - 08/24/15 0818    Subjective States that L knee is a little tight but usually in the mornings it is alright per patient report. States that his biggest issue is swelling.   Patient Stated Goals Get out of pain.   Currently in Pain? Yes   Pain Score 2    Pain Location Knee   Pain Orientation Left   Pain Descriptors / Indicators Tightness   Pain Type Surgical pain   Pain Onset 1 to 4 weeks ago            [facility] PT Assessment - 08/24/15 0001    Assessment   Medical Diagnosis Left total knee replacment.   Onset Date/Surgical Date 07/12/15                      [facility] Adult PT Treatment/Exercise - 08/24/15 0001    Knee/Hip Exercises: Aerobic   Stationary Bike Seat 8 x15 min   Knee/Hip Exercises: Standing   Knee Flexion Left;2 sets;10 reps  14" step   Lateral Step Up Left;3 sets;10 reps;Hand Hold: 0;Step Height: 6"   Forward Step Up Left;3 sets;10 reps;Hand Hold: 0;Step Height: 6"   Rocker Board 3 minutes   Knee/Hip Exercises: Seated   Long Arc Quad Strengthening;Left;3 sets;10 reps;Weights  10 reps 3#, 20 reps 4#   Modalities   Modalities Cryotherapy;Electrical Stimulation   Cryotherapy   Number Minutes Cryotherapy 15 Minutes   Cryotherapy Location Knee   Type of Cryotherapy Ice pack   Electrical Stimulation   Electrical Stimulation Location L Knee   Electrical Stimulation Action IFC   Electrical Stimulation Parameters 80-150 Hz x15 min   Electrical Stimulation Goals Edema;Pain                     PT Long Term Goals - 07/27/15 1815    PT LONG TERM GOAL #1   Title Ind with HEP.  Time 4   Period Weeks   Status New   PT LONG TERM GOAL #2   Title Full active left knee extension.   Time 4   Period Weeks   Status New   PT LONG TERM GOAL #3   Title Active left knee flexion to 115 degrees+ so the patient can perform functional tasks and do so with pain not > 2-3/10.   Time 4   Period Weeks   Status New   PT LONG TERM GOAL #4   Title Decrease edema to within 2 cms of non-affected side to assist with pain reduction and range of motion gains.   Time 4   Period Weeks   Status New   PT LONG TERM GOAL #5   Title Perform a reciprocating stair gait with one railing with pain not > 2-3/10.               Plan - 08/24/15 0910    Clinical Impression Statement Patient tolerated today's treatment well with no complaints of increased pain only of increasing inflammation with exercise. Presents with increased inflammation predominately around the L patella. Required minimal multimodal cueing  for correct exercise technique. Goals remain on-going at this time secondary to decreased L knee ROM and increased edema. Normal modaltiies response noted following removal of the modalities.   Pt will benefit from skilled therapeutic intervention in order to improve on the following deficits Pain;Decreased activity tolerance;Increased edema;Decreased strength;Decreased range of motion   Rehab Potential Good   PT Frequency 3x / week   PT Duration 4 weeks   PT Treatment/Interventions ADLs/Self Care Home Management;Cryotherapy;Electrical Stimulation;Therapeutic activities;Therapeutic exercise;Manual techniques;Patient/family education;Passive range of motion;Vasopneumatic Device        Problem List Patient Active Problem List   Diagnosis Date Noted  . OA (osteoarthritis) of knee 07/12/2015  . Vitamin D deficiency 04/15/2015    Evelene CroonKelsey M Parsons, PTA 08/24/2015, 9:21 AM  [facility] [address] [place], [place], [address] Phone: [phone]   Fax:  [phone]  Name: Lambert ModyMichael T Kinnard MRN: [id number] Date of Birth: 1966/07/06

## 2015-08-26 ENCOUNTER — Encounter: Payer: Self-pay | Admitting: Physical Therapy

## 2015-08-26 ENCOUNTER — Ambulatory Visit: Payer: PRIVATE HEALTH INSURANCE | Admitting: Physical Therapy

## 2015-08-26 DIAGNOSIS — M25662 Stiffness of left knee, not elsewhere classified: Secondary | ICD-10-CM

## 2015-08-26 DIAGNOSIS — M25562 Pain in left knee: Secondary | ICD-10-CM | POA: Diagnosis not present

## 2015-08-26 NOTE — Therapy (Signed)
[facility] [address] [place], [place], [address] Phone: [phone]   Fax:  [phone]  Physical Therapy Treatment  Patient Details  Name: Jack ModyMichael T [name] MRN: [id number] Date of Birth: June 21, 1966 Referring Provider: Ollen GrossFrank Aluisio MD.  Encounter Date: 08/26/2015      PT End of Session - 08/26/15 1127    Visit Number 10   Number of Visits 12   Date for PT Re-Evaluation 09/14/15   PT Start Time 1119   PT Stop Time 1211   PT Time Calculation (min) 52 min   Activity Tolerance Patient tolerated treatment well   Behavior During Therapy [facility] for tasks assessed/performed      Past Medical History  Diagnosis Date  . Kidney stones [id number]  . Anxiety   . GERD (gastroesophageal reflux disease)   . Arthritis   . Headache     occasional     Past Surgical History  Procedure Laterality Date  . Tonsillectomy and adenoidectomy    . Tubes in ears    . Knee surgery Left     3 arthroscopic,  two ACL reconstuctions  . Shoulder surgery Right     dislocation repair with a pin  . 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  Visit Diagnosis:  Left knee pain  Knee stiffness, left      Subjective Assessment - 08/26/15 1126    Subjective Reports a little L knee tightness today.   Patient Stated Goals Get out of pain.   Currently in Pain? Yes   Pain Score 2    Pain Location Knee   Pain Orientation Left   Pain Descriptors / Indicators Tightness   Pain Type Surgical pain   Pain Onset 1 to 4 weeks ago            [facility] - 08/26/15 0001    Assessment   Medical Diagnosis Left total knee replacment.   Onset Date/Surgical Date 07/12/15   Next MD Visit 09/14/2015                     [facility] Treatment/Exercise - 08/26/15  0001    Knee/Hip Exercises: Aerobic   Stationary Bike L1 x12 min   Knee/Hip Exercises: Machines for Strengthening   Cybex Knee Extension 20# 2x10 reps   Cybex Knee Flexion 30# 2x10 reps   Cybex Leg Press 2pl, seat 5, 2x10 reps   Knee/Hip Exercises: Standing   Knee Flexion Left;2 sets;10 reps  14" step   Rocker Board 3 minutes   Modalities   Modalities Electrical Stimulation;Vasopneumatic   Programme researcher, broadcasting/film/videolectrical Stimulation   Electrical Stimulation Location L Knee   Electrical Stimulation Action IFC   Electrical Stimulation Parameters 1-10 Hz x15 min   Electrical Stimulation Goals Edema;Pain   Vasopneumatic   Number Minutes Vasopneumatic  15 minutes   Vasopnuematic Location  Knee   Vasopneumatic Pressure Medium   Vasopneumatic Temperature  34                     PT Long Term Goals - 07/27/15 1815    PT LONG TERM GOAL #1   Title Ind with HEP.   Time 4   Period Weeks   Status New   PT LONG TERM GOAL #2  Title Full active left knee extension.   Time 4   Period Weeks   Status New   PT LONG TERM GOAL #3   Title Active left knee flexion to 115 degrees+ so the patient can perform functional tasks and do so with pain not > 2-3/10.   Time 4   Period Weeks   Status New   PT LONG TERM GOAL #4   Title Decrease edema to within 2 cms of non-affected side to assist with pain reduction and range of motion gains.   Time 4   Period Weeks   Status New   PT LONG TERM GOAL #5   Title Perform a reciprocating stair gait with one railing with pain not > 2-3/10.               Plan - 08/26/15 1201    Clinical Impression Statement Patient tolerated today's treatment well with no complaints of increased pain during any of the exercises. Presents with increased L knee inflammation and presented with cut off TED hose around the L knee. Tolerated the machine strengthening without complaint of pain. HS curl was completed with predonimately LLE only but knee extension LLE only was  completed only a few repititions. MPT educated patient to use full TED hose from foot to groin or no compression just at the knee to prevent edema pooling into L foot. Normal modalities response noted following removal of the modalities. Denied L knee pain following today's treatment.   Pt will benefit from skilled therapeutic intervention in order to improve on the following deficits Pain;Decreased activity tolerance;Increased edema;Decreased strength;Decreased range of motion   Rehab Potential Good   PT Frequency 3x / week   PT Duration 4 weeks   PT Treatment/Interventions ADLs/Self Care Home Management;Cryotherapy;Electrical Stimulation;Therapeutic activities;Therapeutic exercise;Manual techniques;Patient/family education;Passive range of motion;Vasopneumatic Device   PT Next Visit Plan Continue TKR protocol and continue working on L knee flexion.   Consulted and Agree with Plan of Care Patient        Problem List Patient Active Problem List   Diagnosis Date Noted  . OA (osteoarthritis) of knee 07/12/2015  . Vitamin D deficiency 04/15/2015    Evelene Croon, PTA 08/26/2015, 12:20 PM  [facility] [address] [place], [place], [address] Phone: [phone]   Fax:  [phone]  Name: Jack [name] MRN: [id number] Date of Birth: September 25, 1966

## 2015-08-30 ENCOUNTER — Encounter: Payer: Self-pay | Admitting: Physical Therapy

## 2015-08-30 ENCOUNTER — Ambulatory Visit: Payer: PRIVATE HEALTH INSURANCE | Admitting: Physical Therapy

## 2015-08-30 DIAGNOSIS — M25562 Pain in left knee: Secondary | ICD-10-CM

## 2015-08-30 DIAGNOSIS — M25662 Stiffness of left knee, not elsewhere classified: Secondary | ICD-10-CM

## 2015-08-30 NOTE — Therapy (Signed)
[facility] [address] [place], [place], [address] Phone: [phone]   Fax:  [phone]  Physical Therapy Treatment  Patient Details  Name: OZIAH VITANZA MRN: [id number] Date of Birth: 1966-08-05 Referring Provider: Ollen Gross MD.  Encounter Date: 08/30/2015      PT End of Session - 08/30/15 0820    Visit Number 11   Number of Visits 12   Date for PT Re-Evaluation 09/14/15   PT Start Time 0819   PT Stop Time 0909   PT Time Calculation (min) 50 min   Activity Tolerance Patient tolerated treatment well   Behavior During Therapy [facility] for tasks assessed/performed      Past Medical History  Diagnosis Date  . Kidney stones [id number]  . Anxiety   . GERD (gastroesophageal reflux disease)   . Arthritis   . Headache     occasional     Past Surgical History  Procedure Laterality Date  . Tonsillectomy and adenoidectomy    . Tubes in ears    . Knee surgery Left     3 arthroscopic,  two ACL reconstuctions  . Shoulder surgery Right     dislocation repair with a pin  . Total knee arthroplasty Left 07/12/2015    Procedure: TOTAL LEFT  KNEE ARTHROPLASTY;  Surgeon: Ollen Gross, MD;  Location: [facility];  Service: Orthopedics;  Laterality: Left;    There were no vitals filed for this visit.  Visit Diagnosis:  Left knee pain  Knee stiffness, left      Subjective Assessment - 08/30/15 0833    Subjective Reports doing well following previous treatment. Reports that he continues to have the increased swelling this morning.   Patient Stated Goals Get out of pain.   Currently in Pain? No/denies            [facility] PT Assessment - 08/30/15 0001    Assessment   Medical Diagnosis Left total knee replacment.   Onset Date/Surgical Date 07/12/15   Next MD Visit 09/14/2015                     [facility] Adult PT Treatment/Exercise - 08/30/15 0001    Knee/Hip Exercises: Aerobic   Stationary Bike L2 x12 min   Knee/Hip  Exercises: Machines for Strengthening   Cybex Knee Extension 20# 3x10 reps   Cybex Knee Flexion 30# 3x10 reps   Cybex Leg Press 2 pl, seat 7, 2x10 reps; 3 pl, seat 7, 3x10 reps   Knee/Hip Exercises: Standing   Forward Lunges Left;3 sets;10 reps;5 sets  14" step   Rocker Board 2 minutes   Modalities   Modalities Electrical Stimulation;Vasopneumatic   Programme researcher, broadcasting/film/video Location L Knee   Electrical Stimulation Action IFC   Electrical Stimulation Parameters 1-10 Hz x15 min   Electrical Stimulation Goals Edema;Pain   Vasopneumatic   Number Minutes Vasopneumatic  15 minutes   Vasopnuematic Location  Knee   Vasopneumatic Pressure Medium   Vasopneumatic Temperature  34                     PT Long Term Goals - 07/27/15 1815    PT LONG TERM GOAL #1   Title Ind with HEP.   Time 4   Period Weeks   Status New   PT LONG TERM GOAL #2   Title Full active left knee extension.   Time 4   Period Weeks   Status New  PT LONG TERM GOAL #3   Title Active left knee flexion to 115 degrees+ so the patient can perform functional tasks and do so with pain not > 2-3/10.   Time 4   Period Weeks   Status New   PT LONG TERM GOAL #4   Title Decrease edema to within 2 cms of non-affected side to assist with pain reduction and range of motion gains.   Time 4   Period Weeks   Status New   PT LONG TERM GOAL #5   Title Perform a reciprocating stair gait with one railing with pain not > 2-3/10.               Plan - 08/30/15 1220    Clinical Impression Statement Patient tolerated today's treatment fairly well today although he reported increased L knee tightness today especially during forward lunges. Continues to progress and tolerated the machine knee strengthening well without report of increased pain. Presented in clinic today without TED hose on LLE. Normal modalities response noted following removal of the modaliites. Patient requested no  measurements secondary to increased swelling.   Pt will benefit from skilled therapeutic intervention in order to improve on the following deficits Pain;Decreased activity tolerance;Increased edema;Decreased strength;Decreased range of motion   Rehab Potential Good   PT Frequency 3x / week   PT Duration 4 weeks   PT Treatment/Interventions ADLs/Self Care Home Management;Cryotherapy;Electrical Stimulation;Therapeutic activities;Therapeutic exercise;Manual techniques;Patient/family education;Passive range of motion;Vasopneumatic Device   PT Next Visit Plan Continue TKR protocol and continue working on L knee flexion.   Consulted and Agree with Plan of Care Patient        Problem List Patient Active Problem List   Diagnosis Date Noted  . OA (osteoarthritis) of knee 07/12/2015  . Vitamin D deficiency 04/15/2015    Evelene CroonKelsey M Parsons, PTA 08/30/2015, 12:25 PM  [facility] [address] [place], [place], [address] Phone: [phone]   Fax:  [phone]  Name: Lambert ModyMichael T Sopko MRN: [id number] Date of Birth: 1966-03-04

## 2015-09-01 ENCOUNTER — Ambulatory Visit: Payer: PRIVATE HEALTH INSURANCE | Admitting: Physical Therapy

## 2015-09-01 DIAGNOSIS — M25562 Pain in left knee: Secondary | ICD-10-CM

## 2015-09-01 DIAGNOSIS — M25662 Stiffness of left knee, not elsewhere classified: Secondary | ICD-10-CM

## 2015-09-01 NOTE — Therapy (Signed)
[facility] [address] [place], [place], [address] Phone: [phone]   Fax:  [phone]  Physical Therapy Treatment  Patient Details  Name: Jack [name] MRN: [id number] Date of Birth: 24-Aug-1966 Referring Provider: Ollen GrossFrank Aluisio MD.  Encounter Date: 09/01/2015      PT End of Session - 09/01/15 1045    Date for PT Re-Evaluation 09/14/15   PT Start Time 0903   Activity Tolerance Patient tolerated treatment well   Behavior During Therapy [facility] for tasks assessed/performed      Past Medical History  Diagnosis Date  . Kidney stones [id number]  . Anxiety   . GERD (gastroesophageal reflux disease)   . Arthritis   . Headache     occasional     Past Surgical History  Procedure Laterality Date  . Tonsillectomy and adenoidectomy    . Tubes in ears    . Knee surgery Left     3 arthroscopic,  two ACL reconstuctions  . Shoulder surgery Right     dislocation repair with a pin  . 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  Visit Diagnosis:  Left knee pain  Knee stiffness, left      Subjective Assessment - 09/01/15 0925    Subjective Minimal pain today.   Pain Score 2    Pain Location Knee   Pain Orientation Left   Pain Descriptors / Indicators Tightness   Pain Type Surgical pain   Pain Onset 1 to 4 weeks ago   Pain Frequency Constant                         [facility] Adult PT Treatment/Exercise - 09/01/15 0001    Knee/Hip Exercises: Aerobic   Stationary Bike 15 minutes moving forward to seat 4.   Knee/Hip Exercises: Machines for Strengthening   Cybex Knee Extension 20# seat 3 x 15 reps.   Programme researcher, broadcasting/film/videolectrical Stimulation   Electrical Stimulation Location --  Left knee.   Electrical Stimulation Action IFC x 15 minutes.   Electrical  Stimulation Goals Edema   Vasopneumatic   Number Minutes Vasopneumatic  15 minutes   Vasopnuematic Location  --  Left knee.   Vasopneumatic Pressure Medium   Manual Therapy   Manual Therapy Passive ROM   Passive ROM PROM to patient's left knee x 9 minutes.  Achieved 110 degrees of passive left knee flexion.                     PT Long Term Goals - 07/27/15 1815    PT LONG TERM GOAL #1   Title Ind with HEP.   Time 4   Period Weeks   Status New   PT LONG TERM GOAL #2   Title Full active left knee extension.   Time 4   Period Weeks   Status New   PT LONG TERM GOAL #3   Title Active left knee flexion to 115 degrees+ so the patient can perform functional tasks and do so with pain not > 2-3/10.   Time 4   Period Weeks   Status New   PT LONG TERM GOAL #4   Title Decrease edema to within 2 cms of non-affected side to assist with pain reduction and range of motion gains.  Time 4   Period Weeks   Status New   PT LONG TERM GOAL #5   Title Perform a reciprocating stair gait with one railing with pain not > 2-3/10.               Problem List Patient Active Problem List   Diagnosis Date Noted  . OA (osteoarthritis) of knee 07/12/2015  . Vitamin D deficiency 04/15/2015    Jack [name], [place] MPT 09/01/2015, 10:51 AM  [facility] [address] [place], [place], [address] Phone: [phone]   Fax:  [phone]  Name: Jack [name] MRN: [id number] Date of Birth: 1966/08/15

## 2015-09-08 ENCOUNTER — Ambulatory Visit: Payer: PRIVATE HEALTH INSURANCE | Admitting: Physical Therapy

## 2015-09-08 ENCOUNTER — Telehealth: Payer: Self-pay | Admitting: Family

## 2015-09-08 ENCOUNTER — Encounter: Payer: Self-pay | Admitting: Physical Therapy

## 2015-09-08 DIAGNOSIS — M25562 Pain in left knee: Secondary | ICD-10-CM

## 2015-09-08 DIAGNOSIS — M25662 Stiffness of left knee, not elsewhere classified: Secondary | ICD-10-CM

## 2015-09-08 NOTE — Therapy (Signed)
[facility] [address] [place], [place], [address] Phone: [phone]   Fax:  [phone]  Physical Therapy Treatment  Patient Details  Name: Jack [name] MRN: [id number] Date of Birth: March 15, 1966 Referring Provider: Ollen GrossFrank Aluisio MD.  Encounter Date: 09/08/2015      PT End of Session - 09/08/15 0736    Visit Number 13   Number of Visits 12   Date for PT Re-Evaluation 09/14/15   PT Start Time 0732   PT Stop Time 0827   PT Time Calculation (min) 55 min   Activity Tolerance Patient tolerated treatment well   Behavior During Therapy [facility] for tasks assessed/performed      Past Medical History  Diagnosis Date  . Kidney stones [id number]  . Anxiety   . GERD (gastroesophageal reflux disease)   . Arthritis   . Headache     occasional     Past Surgical History  Procedure Laterality Date  . Tonsillectomy and adenoidectomy    . Tubes in ears    . Knee surgery Left     3 arthroscopic,  two ACL reconstuctions  . Shoulder surgery Right     dislocation repair with a pin  . 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  Visit Diagnosis:  Left knee pain  Knee stiffness, left      Subjective Assessment - 09/08/15 0733    Subjective Reports that his knees are always stiff in the morning.   Patient Stated Goals Get out of pain.   Currently in Pain? No/denies            [facility] PT Assessment - 09/08/15 0001    Assessment   Medical Diagnosis Left total knee replacment.   Onset Date/Surgical Date 07/12/15   Next MD Visit 09/14/2015                     [facility] Treatment/Exercise - 09/08/15 0001    Knee/Hip Exercises: Aerobic   Stationary Bike x15 min   Knee/Hip Exercises: Machines for Strengthening   Cybex Knee Extension 20# 3x10  reps   Cybex Knee Flexion 30# 3x10 reps   Cybex Leg Press 3pl, seat 4, 3x10 reps   Knee/Hip Exercises: Standing   Forward Lunges Left;2 sets;10 reps;5 seconds  14" step   Rocker Board 3 minutes   Modalities   Modalities Office managerlectrical Stimulation;Vasopneumatic   Electrical Stimulation   Electrical Stimulation Location L Knee   Electrical Stimulation Action IFC   Electrical Stimulation Parameters 1-10 Hz x15 min   Electrical Stimulation Goals Edema   Vasopneumatic   Number Minutes Vasopneumatic  15 minutes   Vasopnuematic Location  Knee   Vasopneumatic Pressure Medium   Vasopneumatic Temperature  62                     PT Long Term Goals - 09/08/15 0813    PT LONG TERM GOAL #1   Title Ind with HEP.   Time 4   Period Weeks   Status On-going   PT LONG TERM GOAL #2   Title Full active left knee extension.   Time 4   Period Weeks   Status On-going   PT LONG TERM GOAL #3   Title Active left knee flexion to 115 degrees+  so the patient can perform functional tasks and do so with pain not > 2-3/10.   Time 4   Period Weeks   Status On-going   PT LONG TERM GOAL #4   Title Decrease edema to within 2 cms of non-affected side to assist with pain reduction and range of motion gains.   Time 4   Period Weeks   Status On-going   PT LONG TERM GOAL #5   Title Perform a reciprocating stair gait with one railing with pain not > 2-3/10.   Status On-going               Plan - 09/08/15 0813    Clinical Impression Statement Patient continues to progress well with machine strengthening of L knee. Continues to present with increased L knee inflammation predominately along both sides of the L patella and superior to the L patella. Verbalized no complaints of pain during any exercises today. Per patient report he is working on exercises at home in hopes to meet ROM goal and trying to push himself to do well before next MD visit. Presented in clinic today wiithout LLE TED hose  donned. Normal modalities response noted following removal of the modalities. Denied L knee pain following today's treatment.   Pt will benefit from skilled therapeutic intervention in order to improve on the following deficits Pain;Decreased activity tolerance;Increased edema;Decreased strength;Decreased range of motion   Rehab Potential Good   PT Frequency 3x / week   PT Duration 4 weeks   PT Treatment/Interventions ADLs/Self Care Home Management;Cryotherapy;Electrical Stimulation;Therapeutic activities;Therapeutic exercise;Manual techniques;Patient/family education;Passive range of motion;Vasopneumatic Device   PT Next Visit Plan Continue TKR protocol and continue working on L knee flexion.   Consulted and Agree with Plan of Care Patient        Problem List Patient Active Problem List   Diagnosis Date Noted  . OA (osteoarthritis) of knee 07/12/2015  . Vitamin D deficiency 04/15/2015    Evelene Croon, PTA 09/08/2015, 8:29 AM  [facility] [address] [place], [place], [address] Phone: [phone]   Fax:  [phone]  Name: Jack [name] MRN: [id number] Date of Birth: 1966/07/18

## 2015-09-10 ENCOUNTER — Ambulatory Visit: Payer: PRIVATE HEALTH INSURANCE | Attending: Orthopedic Surgery | Admitting: *Deleted

## 2015-09-10 DIAGNOSIS — M25662 Stiffness of left knee, not elsewhere classified: Secondary | ICD-10-CM | POA: Diagnosis present

## 2015-09-10 DIAGNOSIS — M25562 Pain in left knee: Secondary | ICD-10-CM | POA: Diagnosis present

## 2015-09-10 NOTE — Therapy (Signed)
[facility] [address] [place], [place], [address] Phone: [phone]   Fax:  [phone]  Physical Therapy Treatment  Patient Details  Name: Jack [name] MRN: [id number] Date of Birth: [birth year] Referring Provider: Ollen Gross MD.  Encounter Date: 09/10/2015      PT End of Session - 09/10/15 0949    Visit Number 14   Number of Visits 14   Date for PT Re-Evaluation 09/14/15   PT Start Time 0900   PT Stop Time 0959   PT Time Calculation (min) 59 min      Past Medical History  Diagnosis Date  . Kidney stones [id number]  . Anxiety   . GERD (gastroesophageal reflux disease)   . Arthritis   . Headache     occasional     Past Surgical History  Procedure Laterality Date  . Tonsillectomy and adenoidectomy    . Tubes in ears    . Knee surgery Left     3 arthroscopic,  two ACL reconstuctions  . Shoulder surgery Right     dislocation repair with a pin  . Total knee arthroplasty Left 07/12/2015    Procedure: TOTAL LEFT  KNEE ARTHROPLASTY;  Surgeon: Ollen Gross, MD;  Location: [facility];  Service: Orthopedics;  Laterality: Left;    There were no vitals filed for this visit.  Visit Diagnosis:  Knee stiffness, left  Left knee pain      Subjective Assessment - 09/10/15 0902    Subjective Reports that his knees are always stiff in the morning.  2/10   Patient Stated Goals Get out of pain.   Currently in Pain? Yes   Pain Score 2    Pain Location Knee   Pain Orientation Left   Pain Descriptors / Indicators Tightness   Pain Type Surgical pain   Pain Onset 1 to 4 weeks ago   Pain Frequency Constant   Aggravating Factors  swelling   Pain Relieving Factors ice, estim                         [facility] Adult PT Treatment/Exercise - 09/10/15 0001    Knee/Hip Exercises: Aerobic   Elliptical L5 x 5 mins   Recumbent Bike x 10 mins L1,2   Knee/Hip Exercises: Machines for Strengthening   Cybex Knee Extension 20#  3x10-15  reps   Cybex Knee Flexion 30# 3x10-15 reps   Cybex Leg Press 3pl, seat 4, 3x10-15 reps, 2pl seat 3 x20   Modalities   Modalities Electrical Stimulation;Vasopneumatic   Electrical Stimulation   Electrical Stimulation Location L Knee IFC 1-10 hz x    Electrical Stimulation Goals Edema   Vasopneumatic   Number Minutes Vasopneumatic  15 minutes   Vasopnuematic Location  Knee   Vasopneumatic Pressure Medium   Vasopneumatic Temperature  36   Manual Therapy   Manual Therapy Passive ROM   Passive ROM PROM to patient's left knee Achieved 110 degrees of passive left knee flexion.and  2 degrees ext                       PT Long Term Goals - 09/08/15 0813    PT LONG TERM GOAL #1   Title Ind with HEP.   Time 4   Period Weeks   Status On-going   PT LONG TERM GOAL #2   Title Full active left knee extension.   Time 4   Period Weeks  Status On-going   PT LONG TERM GOAL #3   Title Active left knee flexion to 115 degrees+ so the patient can perform functional tasks and do so with pain not > 2-3/10.   Time 4   Period Weeks   Status On-going   PT LONG TERM GOAL #4   Title Decrease edema to within 2 cms of non-affected side to assist with pain reduction and range of motion gains.   Time 4   Period Weeks   Status On-going   PT LONG TERM GOAL #5   Title Perform a reciprocating stair gait with one railing with pain not > 2-3/10.   Status On-going               Plan - 09/10/15 0954    Clinical Impression Statement Pt did great today with strengthening and other exs. Hid ROM has progressed toward goals, but unable to meet yet. He still has some swelling, but doing well   Pt will benefit from skilled therapeutic intervention in order to improve on the following deficits Pain;Decreased activity tolerance;Increased edema;Decreased strength;Decreased range of motion   Rehab Potential Good   PT Frequency 3x / week   PT Duration 4 weeks   PT Treatment/Interventions  ADLs/Self Care Home Management;Cryotherapy;Electrical Stimulation;Therapeutic activities;Therapeutic exercise;Manual techniques;Patient/family education;Passive range of motion;Vasopneumatic Device   PT Next Visit Plan Continue TKR protocol and continue working on L knee flexion.   Consulted and Agree with Plan of Care Patient        Problem List Patient Active Problem List   Diagnosis Date Noted  . OA (osteoarthritis) of knee 07/12/2015  . Vitamin D deficiency 04/15/2015    [place] Nordquist,CHRIS, PTA 09/10/2015, 9:59 AM  [facility] [address] [place], [place], [address] Phone: [phone]   Fax:  [phone]  Name: Jack [name] MRN: [id number] Date of Birth: Feb 13, 1966

## 2015-09-13 ENCOUNTER — Encounter: Payer: Self-pay | Admitting: Physical Therapy

## 2015-09-13 ENCOUNTER — Ambulatory Visit: Payer: PRIVATE HEALTH INSURANCE | Admitting: Physical Therapy

## 2015-09-13 DIAGNOSIS — M25562 Pain in left knee: Secondary | ICD-10-CM

## 2015-09-13 DIAGNOSIS — M25662 Stiffness of left knee, not elsewhere classified: Secondary | ICD-10-CM | POA: Diagnosis not present

## 2015-09-13 NOTE — Therapy (Signed)
[facility] [address] [place], [place], [address] Phone: [phone]   Fax:  [phone]  Physical Therapy Treatment  Patient Details  Name: Jack [name] MRN: [id number] Date of Birth: 11/29/1965 Referring Provider: Ollen Gross MD.  Encounter Date: 09/13/2015      PT End of Session - 09/13/15 0734    Visit Number 15   Number of Visits 14   Date for PT Re-Evaluation 09/14/15   PT Start Time 0733   PT Stop Time 0822   PT Time Calculation (min) 49 min   Activity Tolerance Patient tolerated treatment well   Behavior During Therapy [facility] for tasks assessed/performed      Past Medical History  Diagnosis Date  . Kidney stones [id number]  . Anxiety   . GERD (gastroesophageal reflux disease)   . Arthritis   . Headache     occasional     Past Surgical History  Procedure Laterality Date  . Tonsillectomy and adenoidectomy    . Tubes in ears    . Knee surgery Left     3 arthroscopic,  two ACL reconstuctions  . Shoulder surgery Right     dislocation repair with a pin  . Total knee arthroplasty Left 07/12/2015    Procedure: TOTAL LEFT  KNEE ARTHROPLASTY;  Surgeon: Ollen Gross, MD;  Location: [facility];  Service: Orthopedics;  Laterality: Left;    There were no vitals filed for this visit.  Visit Diagnosis:  Knee stiffness, left  Left knee pain      Subjective Assessment - 09/13/15 0734    Subjective Reports that L knee was swollen following elliptical in previous treatment.   Patient Stated Goals Get out of pain.   Currently in Pain? Yes   Pain Location Knee   Pain Orientation Left   Pain Descriptors / Indicators Sore  "from swelling"   Pain Type Surgical pain   Pain Onset 1 to 4 weeks ago            [facility] PT Assessment - 09/13/15 0001    Assessment   Medical Diagnosis Left total knee replacment.   Onset Date/Surgical Date 07/12/15   Next MD Visit 09/14/2015   Observation/Other Assessments-Edema    Edema  Circumferential   Circumferential Edema   Circumferential - Left  L 43.1 cm, R 40cm   ROM / Strength   AROM / PROM / Strength AROM;PROM   AROM   Overall AROM  Deficits   AROM Assessment Site Knee   Right/Left Knee Right   Right Knee Extension 11   Right Knee Flexion 107   PROM   PROM Assessment Site --   Right/Left Knee --                     [facility] Adult PT Treatment/Exercise - 09/13/15 0001    Knee/Hip Exercises: Aerobic   Stationary Bike x12 min   Knee/Hip Exercises: Machines for Strengthening   Cybex Knee Extension 20# 3x10 reps   Cybex Knee Flexion 30# 3x10 reps   Cybex Leg Press 2pl, seat 4, 3x10 reps   Modalities   Modalities Electrical Stimulation;Vasopneumatic   Electrical Stimulation   Electrical Stimulation Location L knee   Electrical Stimulation Action IFC   Electrical Stimulation Parameters 1-10 Hz x15 min   Electrical Stimulation Goals Edema   Vasopneumatic   Number Minutes Vasopneumatic  15 minutes   Vasopnuematic Location  Knee   Vasopneumatic Pressure Medium   Vasopneumatic Temperature  34  PT Long Term Goals - 09/13/15 0805    PT LONG TERM GOAL #1   Title Ind with HEP.   Time 4   Period Weeks   Status On-going   PT LONG TERM GOAL #2   Title Full active left knee extension.   Time 4   Period Weeks   Status On-going  AROM L knee ext 11 deg from neutral   PT LONG TERM GOAL #3   Title Active left knee flexion to 115 degrees+ so the patient can perform functional tasks and do so with pain not > 2-3/10.   Time 4   Period Weeks   Status On-going  AROM L knee flexion 107 deg   PT LONG TERM GOAL #4   Title Decrease edema to within 2 cms of non-affected side to assist with pain reduction and range of motion gains.   Time 4   Period Weeks   Status On-going  L 3.1 cm > R knee circumferential   PT LONG TERM GOAL #5   Title Perform a reciprocating stair gait with one railing with pain not > 2-3/10.    Status Achieved               Plan - 09/13/15 0752    Clinical Impression Statement Patient tolerated today's treatment fairly well although he continued to complain of L knee soreness and swelling following use of elliptical in previous treatment. Presented in clinic with increaed inflammaiton predominately around the L patella. Tolerates machine strengthening well although he had increased discomfort today secondary to previous treatment. L knee 3.1cm  difference between R knee regarding edema. AROM of R knee measured as 11-107 deg in supine and reported pain with extension. Normal modalites response noted following removal of the modalities. Achieved LT goal regarding stair ambulation but other LT goals remain on-going secondary to increased edema, lack of full ROM. Denied L knee pain upon end of treatment.   Pt will benefit from skilled therapeutic intervention in order to improve on the following deficits Pain;Decreased activity tolerance;Increased edema;Decreased strength;Decreased range of motion   Rehab Potential Good   PT Frequency 3x / week   PT Duration 4 weeks   PT Treatment/Interventions ADLs/Self Care Home Management;Cryotherapy;Electrical Stimulation;Therapeutic activities;Therapeutic exercise;Manual techniques;Patient/family education;Passive range of motion;Vasopneumatic Device   PT Next Visit Plan Continue TKR protocol and continue working on L knee flexion.   Consulted and Agree with Plan of Care Patient        Problem List Patient Active Problem List   Diagnosis Date Noted  . OA (osteoarthritis) of knee 07/12/2015  . Vitamin D deficiency 04/15/2015    Florence CannerKelsey Parsons, PTA 09/13/2015 10:41 AM [place] MPT [facility] [address] [place], [place], [address] Phone: [phone]   Fax:  [phone]  Name: Jack [name] MRN: [id number] Date of Birth: 03/30/1966

## 2015-09-16 ENCOUNTER — Encounter: Payer: Self-pay | Admitting: Physical Therapy

## 2015-09-16 ENCOUNTER — Ambulatory Visit: Payer: PRIVATE HEALTH INSURANCE | Admitting: Physical Therapy

## 2015-09-16 DIAGNOSIS — M25662 Stiffness of left knee, not elsewhere classified: Secondary | ICD-10-CM

## 2015-09-16 DIAGNOSIS — M25562 Pain in left knee: Secondary | ICD-10-CM

## 2015-09-16 NOTE — Therapy (Addendum)
[facility] [place], [place], [address] Phone: [phone]   Fax:  [phone]  Physical Therapy Treatment  Patient Details  Name: Jack [name] MRN: [id number] Date of Birth: 14-Aug-1966 Referring Provider: Gaynelle Arabian MD.  Encounter Date: 09/16/2015      PT End of Session - 09/16/15 0735    Visit Number 16   Number of Visits 14   Date for PT Re-Evaluation 09/14/15   PT Start Time 0734   PT Stop Time 0826   PT Time Calculation (min) 52 min   Activity Tolerance Patient tolerated treatment well   Behavior During Therapy [facility] for tasks assessed/performed      Past Medical History  Diagnosis Date  . Kidney stones [id number]  . Anxiety   . GERD (gastroesophageal reflux disease)   . Arthritis   . Headache     occasional     Past Surgical History  Procedure Laterality Date  . Tonsillectomy and adenoidectomy    . Tubes in ears    . Knee surgery Left     3 arthroscopic,  two ACL reconstuctions  . Shoulder surgery Right     dislocation repair with a pin  . Total knee arthroplasty Left 07/12/2015    Procedure: TOTAL LEFT  KNEE ARTHROPLASTY;  Surgeon: Gaynelle Arabian, MD;  Location: [facility];  Service: Orthopedics;  Laterality: Left;    There were no vitals filed for this visit.  Visit Diagnosis:  Knee stiffness, left  Left knee pain      Subjective Assessment - 09/16/15 0734    Subjective Reports that MD has released him and he returns to work 09/27/2015. Patient states that swelling is normal for up to 1 year post-surgery and to try to ice and elevate as much as he can and at work.   Patient Stated Goals Get out of pain.   Currently in Pain? No/denies            [facility] PT Assessment - 09/16/15 0001    Assessment   Medical Diagnosis Left total knee replacment.   Onset Date/Surgical Date 07/12/15   Circumferential Edema   Circumferential - Right 39.7cm   Circumferential - Left  42.5 cm   ROM / Strength   AROM / PROM / Strength AROM;Strength   AROM   Overall AROM  Deficits   AROM Assessment Site Knee   Right/Left Knee Right   Right Knee Extension 5   Right Knee Flexion 110   PROM   PROM Assessment Site Knee   Right/Left Knee Left   Left Knee Extension 0   Left Knee Flexion 115   Strength   Overall Strength Within functional limits for tasks performed   Overall Strength Comments L hip flex/ext 5/5   Strength Assessment Site Knee;Hip   Right/Left Knee Left   Left Knee Flexion 5/5   Left Knee Extension 5/5                     [facility] Adult PT Treatment/Exercise - 09/16/15 0001    Knee/Hip Exercises: Aerobic   Stationary Bike x12 min   Knee/Hip Exercises: Machines for Strengthening   Cybex Knee Extension 20# 3x10 reps  LLE only   Cybex Knee Flexion 30# 3x10 reps  LLE only   Cybex Leg Press 2pl, seat 4, 3x10 reps   Knee/Hip Exercises: Standing   Forward Lunges Left;1 set;10 reps;Other (comment)  14" step   Modalities   Modalities Electrical Stimulation;Vasopneumatic  Acupuncturist Location L knee   Chartered certified accountant IFC   Electrical Stimulation Parameters 1-10 Hz x15 min   Electrical Stimulation Goals Edema   Vasopneumatic   Number Minutes Vasopneumatic  15 minutes   Vasopnuematic Location  Knee   Vasopneumatic Pressure Medium   Vasopneumatic Temperature  34                     PT Long Term Goals - 09/16/15 0175    PT LONG TERM GOAL #1   Title Ind with HEP.   Time 4   Period Weeks   Status Achieved  Patient stated he had been doing exercises from therapy at home such as lunges   PT LONG TERM GOAL #2   Title Full active left knee extension.   Time 4   Period Weeks   Status Not Met  AROM L knee 5 deg from neutral   PT LONG TERM GOAL #3   Title Active left knee flexion to 115 degrees+ so the patient can perform functional tasks and do so with pain not > 2-3/10.   Time 4   Period Weeks    Status Not Met  AROM L knee flexion 110 deg, PROM flexion 115 deg   PT LONG TERM GOAL #4   Title Decrease edema to within 2 cms of non-affected side to assist with pain reduction and range of motion gains.   Time 4   Period Weeks   Status Not Met  L 2.8 cm > R circumferential edema   PT LONG TERM GOAL #5   Title Perform a reciprocating stair gait with one railing with pain not > 2-3/10.   Status Achieved               Plan - 09/16/15 0819    Clinical Impression Statement Patient tolerated today's treatment well with no complaints of pain with treatment. Requested ROM measurements be taken in sitting like they were taken at MD appointment. Continues to present in clinic with increased edema primarily around the L patella and some notable in LLE. Achieved LT HEP and stair ambulation goal but all other goals not met secondary to ROM deficits and increased L knee edema. AROM L knee measured as 5-110 deg, PROM 0-115 deg. L hip and knee MMT measured as 5/5 throughout. Normal modaliites response noted following removal of the modalities. Experienced 1-2/10 L knee pain following today's treatment.   Pt will benefit from skilled therapeutic intervention in order to improve on the following deficits Pain;Decreased activity tolerance;Increased edema;Decreased strength;Decreased range of motion   Rehab Potential Good   PT Frequency 3x / week   PT Duration 4 weeks   PT Treatment/Interventions ADLs/Self Care Home Management;Cryotherapy;Electrical Stimulation;Therapeutic activities;Therapeutic exercise;Manual techniques;Patient/family education;Passive range of motion;Vasopneumatic Device   PT Next Visit Plan Communicate need for D/C summary to MPT   Consulted and Agree with Plan of Care Patient        Problem List Patient Active Problem List   Diagnosis Date Noted  . OA (osteoarthritis) of knee 07/12/2015  . Vitamin D deficiency 04/15/2015   PHYSICAL THERAPY DISCHARGE SUMMARY  Visits  from Start of Care: 16  Current functional level related to goals / functional outcomes: Please see above.   Remaining deficits: ROM: -5 to 110 degrees and continued edema.   Education / Equipment: HEP.  Plan: Patient agrees to discharge.  Patient goals were partially met. Patient is being discharged due to meeting  the stated rehab goals.  ?????       [place], [name] MPT 09/16/2015, 1:53 PM  [facility] [address] [place], [place], [address] Phone: [phone]   Fax:  [phone]  Name: Jack [name] MRN: [id number] Date of Birth: 10-26-1965

## 2015-09-16 NOTE — Therapy (Signed)
[facility] [place], [place], [address] Phone: [phone]   Fax:  [phone]  Physical Therapy Treatment  Patient Details  Name: Jack [name] MRN: [id number] Date of Birth: 04-12-1966 Referring Provider: Gaynelle Arabian MD.  Encounter Date: 09/16/2015      PT End of Session - 09/16/15 0735    Visit Number 16   Number of Visits 14   Date for PT Re-Evaluation 09/14/15   PT Start Time 0734   PT Stop Time 0826   PT Time Calculation (min) 52 min   Activity Tolerance Patient tolerated treatment well   Behavior During Therapy [facility] for tasks assessed/performed      Past Medical History  Diagnosis Date  . Kidney stones [id number]  . Anxiety   . GERD (gastroesophageal reflux disease)   . Arthritis   . Headache     occasional     Past Surgical History  Procedure Laterality Date  . Tonsillectomy and adenoidectomy    . Tubes in ears    . Knee surgery Left     3 arthroscopic,  two ACL reconstuctions  . Shoulder surgery Right     dislocation repair with a pin  . Total knee arthroplasty Left 07/12/2015    Procedure: TOTAL LEFT  KNEE ARTHROPLASTY;  Surgeon: Gaynelle Arabian, MD;  Location: [facility];  Service: Orthopedics;  Laterality: Left;    There were no vitals filed for this visit.  Visit Diagnosis:  Knee stiffness, left  Left knee pain      Subjective Assessment - 09/16/15 0734    Subjective Reports that MD has released him and he returns to work 09/27/2015. Patient states that swelling is normal for up to 1 year post-surgery and to try to ice and elevate as much as he can and at work.   Patient Stated Goals Get out of pain.   Currently in Pain? No/denies            [facility] PT Assessment - 09/16/15 0001    Assessment   Medical Diagnosis Left total knee replacment.   Onset Date/Surgical Date 07/12/15   Circumferential Edema   Circumferential - Right 39.7cm   Circumferential - Left  42.5 cm   ROM / Strength    AROM / PROM / Strength AROM;Strength   AROM   Overall AROM  Deficits   AROM Assessment Site Knee   Right/Left Knee Right   Right Knee Extension 5   Right Knee Flexion 110   PROM   PROM Assessment Site Knee   Right/Left Knee Left   Left Knee Extension 0   Left Knee Flexion 115   Strength   Overall Strength Within functional limits for tasks performed   Overall Strength Comments L hip flex/ext 5/5   Strength Assessment Site Knee;Hip   Right/Left Knee Left   Left Knee Flexion 5/5   Left Knee Extension 5/5                     [facility] Adult PT Treatment/Exercise - 09/16/15 0001    Knee/Hip Exercises: Aerobic   Stationary Bike x12 min   Knee/Hip Exercises: Machines for Strengthening   Cybex Knee Extension 20# 3x10 reps  LLE only   Cybex Knee Flexion 30# 3x10 reps  LLE only   Cybex Leg Press 2pl, seat 4, 3x10 reps   Knee/Hip Exercises: Standing   Forward Lunges Left;1 set;10 reps;Other (comment)  14" step   Modalities   Modalities Electrical Stimulation;Vasopneumatic  Acupuncturist Location L knee   Chartered certified accountant IFC   Electrical Stimulation Parameters 1-10 Hz x15 min   Electrical Stimulation Goals Edema   Vasopneumatic   Number Minutes Vasopneumatic  15 minutes   Vasopnuematic Location  Knee   Vasopneumatic Pressure Medium   Vasopneumatic Temperature  34                     PT Long Term Goals - 09/16/15 4287    PT LONG TERM GOAL #1   Title Ind with HEP.   Time 4   Period Weeks   Status Achieved  Patient stated he had been doing exercises from therapy at home such as lunges   PT LONG TERM GOAL #2   Title Full active left knee extension.   Time 4   Period Weeks   Status Not Met  AROM L knee 5 deg from neutral   PT LONG TERM GOAL #3   Title Active left knee flexion to 115 degrees+ so the patient can perform functional tasks and do so with pain not > 2-3/10.   Time 4   Period Weeks    Status Not Met  AROM L knee flexion 110 deg, PROM flexion 115 deg   PT LONG TERM GOAL #4   Title Decrease edema to within 2 cms of non-affected side to assist with pain reduction and range of motion gains.   Time 4   Period Weeks   Status Not Met  L 2.8 cm > R circumferential edema   PT LONG TERM GOAL #5   Title Perform a reciprocating stair gait with one railing with pain not > 2-3/10.   Status Achieved               Plan - 09/16/15 0819    Clinical Impression Statement Patient tolerated today's treatment well with no complaints of pain with treatment. Requested ROM measurements be taken in sitting like they were taken at MD appointment. Continues to present in clinic with increased edema primarily around the L patella and some notable in LLE. Achieved LT HEP and stair ambulation goal but all other goals not met secondary to ROM deficits and increased L knee edema. AROM L knee measured as 5-110 deg, PROM 0-115 deg. L hip and knee MMT measured as 5/5 throughout. Normal modaliites response noted following removal of the modalities. Experienced 1-2/10 L knee pain following today's treatment.   Pt will benefit from skilled therapeutic intervention in order to improve on the following deficits Pain;Decreased activity tolerance;Increased edema;Decreased strength;Decreased range of motion   Rehab Potential Good   PT Frequency 3x / week   PT Duration 4 weeks   PT Treatment/Interventions ADLs/Self Care Home Management;Cryotherapy;Electrical Stimulation;Therapeutic activities;Therapeutic exercise;Manual techniques;Patient/family education;Passive range of motion;Vasopneumatic Device   PT Next Visit Plan Communicate need for D/C summary to MPT   Consulted and Agree with Plan of Care Patient        Problem List Patient Active Problem List   Diagnosis Date Noted  . OA (osteoarthritis) of knee 07/12/2015  . Vitamin D deficiency 04/15/2015    Ahmed Prima, PTA 09/16/2015 9:00  AM  [facility] [place], [place], [address] Phone: [phone]   Fax:  [phone]  Name: DEMIAN MAISEL MRN: [id number] Date of Birth: 1966-04-02

## 2015-09-21 ENCOUNTER — Encounter: Payer: PRIVATE HEALTH INSURANCE | Admitting: *Deleted

## 2015-10-18 ENCOUNTER — Ambulatory Visit: Payer: PRIVATE HEALTH INSURANCE | Admitting: Family

## 2015-10-19 ENCOUNTER — Ambulatory Visit: Payer: PRIVATE HEALTH INSURANCE | Admitting: Family

## 2015-10-22 ENCOUNTER — Ambulatory Visit (INDEPENDENT_AMBULATORY_CARE_PROVIDER_SITE_OTHER): Payer: PRIVATE HEALTH INSURANCE | Admitting: Family

## 2015-10-22 ENCOUNTER — Encounter: Payer: Self-pay | Admitting: Family

## 2015-10-22 VITALS — BP 133/93 | HR 85 | Temp 97.3°F | Ht 68.0 in | Wt 209.4 lb

## 2015-10-22 DIAGNOSIS — M1712 Unilateral primary osteoarthritis, left knee: Secondary | ICD-10-CM | POA: Diagnosis not present

## 2015-10-22 DIAGNOSIS — Z96652 Presence of left artificial knee joint: Secondary | ICD-10-CM | POA: Diagnosis not present

## 2015-10-22 DIAGNOSIS — E559 Vitamin D deficiency, unspecified: Secondary | ICD-10-CM | POA: Diagnosis not present

## 2015-10-22 NOTE — Patient Instructions (Signed)
Total Knee Replacement, Care After Refer to this sheet in the next few weeks. These instructions provide you with information on caring for yourself after your procedure. Your health care provider also may give you specific instructions. Your treatment has been planned according to the most current medical practices, but problems sometimes occur. Call your health care provider if you have any problems or questions after your procedure. HOME CARE INSTRUCTIONS   See a physical therapist as directed by your health care provider.  Do not take baths, swim, or use a hot tub until your health care provider approves.  Take medicines only as directed by your health care provider.  Avoid lifting or driving until you are instructed otherwise.  If you have been sent home with a continuous passive motion machine, use it as directed by your health care provider.  Rest often, but move around as much as you can tolerate. Movement helps you to heal and helps to prevent stiffness, skin sores, and blood clots.  Wear compression stockings as told by your health care provider. These stockings help to prevent blood clots and reduce swelling in your legs.  Follow instructions from your health care provider about how to take care of your incision. Make sure you:  Wash your hands with soap and water before you change your bandage (dressing). If soap and water are not available, use hand sanitizer.  Change your dressing as told by your health care provider.  Leave stitches (sutures), skin glue, or adhesive strips in place. These skin closures may need to be in place for 2 weeks or longer. If adhesive strip edges start to loosen and curl up, you may trim the loose edges. Do not remove adhesive strips completely unless your health care provider tells you to do that. SEEK MEDICAL CARE IF:  You have difficulty breathing.  You have drainage, redness, swelling, or pain at your incision site.  You have a bad smell  coming from your incision site.  You have persistent bleeding from your incision site.  Your incision breaks open after sutures (stitches) or staples have been removed.  You have a fever. SEEK IMMEDIATE MEDICAL CARE IF:   You have a rash.  You have pain or swelling in your calf or thigh.  You have shortness of breath or chest pain.  Your range of motion in your knee is decreasing rather than increasing.   This information is not intended to replace advice given to you by your health care provider. Make sure you discuss any questions you have with your health care provider.   Document Released: 04/14/2005 Document Revised: 06/16/2015 Document Reviewed: 11/14/2011 Elsevier Interactive Patient Education [employer]2016 Elsevier Inc.

## 2015-10-22 NOTE — Progress Notes (Signed)
   Subjective:    Patient ID: Lambert ModyMichael T Ragon, male    DOB: 1966-05-21, 50 y.o.   MRN: [id number]   HPI PT presents to the office today for chronic follow up. 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 PT states he no longer has pain in his knee, but does continue to have intermittent swelling. PT states he works 10 hour days and has moderate swelling in the evening.    Review of Systems  Constitutional: Negative.   HENT: Negative.   Respiratory: Negative.   Cardiovascular: Negative.   Gastrointestinal: Negative.   Endocrine: Negative.   Genitourinary: Negative.   Neurological: Negative.   Hematological: Negative.   Psychiatric/Behavioral: Negative.   All other systems reviewed and are negative.      Objective:   Physical Exam  Constitutional: He is oriented to person, place, and time. He appears well-developed and well-nourished. No distress.  HENT:  Head: Normocephalic.  Right Ear: External ear normal.  Left Ear: External ear normal.  Nose: Nose normal.  Mouth/Throat: Oropharynx is clear and moist.  Eyes: Pupils are equal, round, and reactive to light. Right eye exhibits no discharge. Left eye exhibits no discharge.  Neck: Normal range of motion. Neck supple. No thyromegaly present.  Cardiovascular: Normal rate, regular rhythm, normal heart sounds and intact distal pulses.   No murmur heard. Pulmonary/Chest: Effort normal and breath sounds normal. No respiratory distress. He has no wheezes.  Abdominal: Soft. Bowel sounds are normal. He exhibits no distension. There is no tenderness.  Musculoskeletal: Normal range of motion. He exhibits no edema or tenderness.  Neurological: He is alert and oriented to person, place, and time. He has normal reflexes. No cranial nerve deficit.  Skin: Skin is warm and dry. No rash noted. No erythema.  Psychiatric: He has a normal mood and affect. His behavior is normal. Judgment and thought content normal.  Vitals  reviewed.     BP 133/93 mmHg  Pulse 85  Temp(Src) 97.3 F (36.3 C) (Oral)  Ht 5\' 8"  (1.727 m)  Wt 209 lb 6.4 oz (94.983 kg)  BMI 31.85 kg/m2     Assessment & Plan:  1. Primary osteoarthritis of left knee -Continue with ROM exercises -Follow up with Ortho as needed  2. Vitamin D deficiency  3. History of total left knee replacement    Continue all meds Labs pending Health Maintenance reviewed Diet and exercise encouraged RTO July for CPE  Jannifer Rodneyhristy Lavel Rieman, FNP

## 2016-09-28 ENCOUNTER — Other Ambulatory Visit: Payer: Self-pay | Admitting: Orthopedic Surgery

## 2016-09-28 ENCOUNTER — Ambulatory Visit (INDEPENDENT_AMBULATORY_CARE_PROVIDER_SITE_OTHER): Payer: PRIVATE HEALTH INSURANCE

## 2016-09-28 ENCOUNTER — Encounter (INDEPENDENT_AMBULATORY_CARE_PROVIDER_SITE_OTHER): Payer: PRIVATE HEALTH INSURANCE

## 2016-09-28 DIAGNOSIS — Z96652 Presence of left artificial knee joint: Secondary | ICD-10-CM

## 2017-02-02 ENCOUNTER — Ambulatory Visit: Payer: PRIVATE HEALTH INSURANCE | Admitting: Family

## 2017-02-09 ENCOUNTER — Encounter: Payer: Self-pay | Admitting: Pediatrics

## 2017-02-09 ENCOUNTER — Ambulatory Visit (INDEPENDENT_AMBULATORY_CARE_PROVIDER_SITE_OTHER): Payer: PRIVATE HEALTH INSURANCE | Admitting: Pediatrics

## 2017-02-09 VITALS — BP 108/78 | HR 88 | Temp 97.8°F | Ht 68.0 in | Wt 205.4 lb

## 2017-02-09 DIAGNOSIS — R5383 Other fatigue: Secondary | ICD-10-CM | POA: Diagnosis not present

## 2017-02-09 DIAGNOSIS — F39 Unspecified mood [affective] disorder: Secondary | ICD-10-CM | POA: Diagnosis not present

## 2017-02-09 LAB — BAYER DCA HB A1C WAIVED: HB A1C (BAYER DCA - WAIVED): 5.3 % (ref <7.0)

## 2017-02-09 MED ORDER — DULOXETINE HCL 20 MG PO CPEP: 20.0000 mg | ORAL_CAPSULE | Freq: Every day | ORAL | 3 refills | Status: DC

## 2017-02-09 NOTE — Progress Notes (Signed)
  Subjective:   Patient ID: Jack [name], male    DOB: 01-29-1966, 51 y.o.   MRN: [id number] CC: Fatigue  HPI: Jack [name] is a 51 y.o. male presenting for Fatigue  Works 65-70 hours a week On the go all the time when working Feeling tired all the time Gets about 4 hrs of sleep a night Drinking a lot of sweet tea and koolaid Gets up 3-4 times a night to use the bathroom Says schedule at work and sleep havent changed recently, he didn't use to feel this tired  Worries a lot, problems with family, stress with son, ex-wife Doesn't drink alcohol daily His girlfriend has wondered about his mood he says  Depression screen [facility] 2/9 02/09/2017 10/22/2015 06/17/2015 04/14/2015  Decreased Interest 2 0 0 2  Down, Depressed, Hopeless 1 0 0 1  PHQ - 2 Score 3 0 0 3  Altered sleeping 2 - - 2  Tired, decreased energy 3 - - 2  Change in appetite 0 - - 1  Feeling bad or failure about yourself  1 - - 1  Trouble concentrating 1 - - 1  Moving slowly or fidgety/restless 1 - - 1  Suicidal thoughts 0 - - 0  PHQ-9 Score 11 - - 11  Difficult doing work/chores Somewhat difficult - - -     Relevant past medical, surgical, family and social history reviewed. Allergies and medications reviewed and updated. History  Smoking Status  . Never Smoker  Smokeless Tobacco  . Former Systems developer  . Types: Chew   ROS: Per HPI   Objective:    BP 108/78   Pulse 88   Temp 97.8 F (36.6 C) (Oral)   Ht _0  (1.727 m)   Wt 205 lb 6.4 oz (93.2 kg)   BMI 31.23 kg/m   Wt Readings from Last 3 Encounters:  02/09/17 205 lb 6.4 oz (93.2 kg)  10/22/15 209 lb 6.4 oz (95 kg)  07/12/15 206 lb (93.4 kg)    Gen: NAD, alert, cooperative with exam, NCAT EYES: EOMI, no conjunctival injection, or no icterus ENT:  TMs pearly gray b/l, OP without erythema LYMPH: no cervical LAD CV: NRRR, normal S1/S2, no murmur, distal pulses 2+ b/l Resp: CTABL, no wheezes, normal WOB Abd: +BS, soft, NTND. no guarding or organomegaly Ext:  No edema, warm Neuro: Alert and oriented, strength equal b/l UE and LE, coordination grossly normal MSK: normal muscle bulk Psych: normal affect, no thoughts of self harm  Assessment & Plan:  Keeyon was seen today for fatigue.  Diagnoses and all orders for this visit:  Other fatigue Likely multifactorial, stress, depression, long work hours with minimal sleep Check labs Discussed earlier bed time, goal 8 hrs sleep a night -     Bayer DCA Hb A1c Waived -     CMP14+EGFR -     CBC with Differential/Platelet -     Lipid panel -     TSH -     VITAMIN D 25 Hydroxy (Vit-D Deficiency, Fractures)  Mood disorder (HCC) Feels safe at home Mood down PHQ9 of 11 Trial of below -     DULoxetine (CYMBALTA) 20 MG capsule; Take 1 capsule (20 mg total) by mouth daily.   Follow up plan: 4 weeks Assunta Found, MD [place]

## 2017-02-10 LAB — CBC WITH DIFFERENTIAL/PLATELET
BASOS ABS: 0.1 10*3/uL (ref 0.0–0.2)
Basos: 1 %
EOS (ABSOLUTE): 0.1 10*3/uL (ref 0.0–0.4)
Eos: 1 %
Hematocrit: 47.3 % (ref 37.5–51.0)
Hemoglobin: 15.8 g/dL (ref 13.0–17.7)
Immature Grans (Abs): 0 10*3/uL (ref 0.0–0.1)
Immature Granulocytes: 0 %
LYMPHS ABS: 1.9 10*3/uL (ref 0.7–3.1)
Lymphs: 20 %
MCH: 30 pg (ref 26.6–33.0)
MCHC: 33.4 g/dL (ref 31.5–35.7)
MCV: 90 fL (ref 79–97)
Monocytes Absolute: 0.5 10*3/uL (ref 0.1–0.9)
Monocytes: 6 %
NEUTROS ABS: 6.9 10*3/uL (ref 1.4–7.0)
Neutrophils: 72 %
Platelets: 305 10*3/uL (ref 150–379)
RBC: 5.26 x10E6/uL (ref 4.14–5.80)
RDW: 14 % (ref 12.3–15.4)
WBC: 9.5 10*3/uL (ref 3.4–10.8)

## 2017-02-10 LAB — CMP14+EGFR
A/G RATIO: 1.9 (ref 1.2–2.2)
ALBUMIN: 4.5 g/dL (ref 3.5–5.5)
ALK PHOS: 106 IU/L (ref 39–117)
ALT: 22 IU/L (ref 0–44)
AST: 25 IU/L (ref 0–40)
BILIRUBIN TOTAL: 0.4 mg/dL (ref 0.0–1.2)
BUN / CREAT RATIO: 10 (ref 9–20)
BUN: 10 mg/dL (ref 6–24)
CHLORIDE: 99 mmol/L (ref 96–106)
CO2: 26 mmol/L (ref 18–29)
Calcium: 9.3 mg/dL (ref 8.7–10.2)
Creatinine, Ser: 0.99 mg/dL (ref 0.76–1.27)
GFR calc non Af Amer: 89 mL/min/{1.73_m2} (ref >59)
GFR, EST AFRICAN AMERICAN: 103 mL/min/{1.73_m2} (ref >59)
GLUCOSE: 109 mg/dL — AB (ref 65–99)
Globulin, Total: 2.4 g/dL (ref 1.5–4.5)
POTASSIUM: 4 mmol/L (ref 3.5–5.2)
Sodium: 143 mmol/L (ref 134–144)
TOTAL PROTEIN: 6.9 g/dL (ref 6.0–8.5)

## 2017-02-10 LAB — LIPID PANEL
CHOLESTEROL TOTAL: 153 mg/dL (ref 100–199)
Chol/HDL Ratio: 3.5 ratio (ref 0.0–5.0)
HDL: 44 mg/dL (ref >39)
LDL Calculated: 82 mg/dL (ref 0–99)
TRIGLYCERIDES: 133 mg/dL (ref 0–149)
VLDL Cholesterol Cal: 27 mg/dL (ref 5–40)

## 2017-02-10 LAB — TSH: TSH: 1.26 u[IU]/mL (ref 0.450–4.500)

## 2017-02-10 LAB — VITAMIN D 25 HYDROXY (VIT D DEFICIENCY, FRACTURES): VIT D 25 HYDROXY: 25 ng/mL — AB (ref 30.0–100.0)

## 2018-05-15 ENCOUNTER — Ambulatory Visit (INDEPENDENT_AMBULATORY_CARE_PROVIDER_SITE_OTHER): Payer: BLUE CROSS/BLUE SHIELD

## 2018-05-15 ENCOUNTER — Encounter: Payer: Self-pay | Admitting: Pediatrics

## 2018-05-15 ENCOUNTER — Ambulatory Visit: Payer: BLUE CROSS/BLUE SHIELD | Admitting: Pediatrics

## 2018-05-15 VITALS — BP 125/85 | HR 70 | Temp 97.2°F | Ht 68.0 in | Wt 208.0 lb

## 2018-05-15 DIAGNOSIS — R339 Retention of urine, unspecified: Secondary | ICD-10-CM

## 2018-05-15 DIAGNOSIS — N200 Calculus of kidney: Secondary | ICD-10-CM | POA: Diagnosis not present

## 2018-05-15 DIAGNOSIS — R109 Unspecified abdominal pain: Secondary | ICD-10-CM

## 2018-05-15 DIAGNOSIS — K5900 Constipation, unspecified: Secondary | ICD-10-CM

## 2018-05-15 LAB — URINALYSIS, COMPLETE
BILIRUBIN UA: NEGATIVE
GLUCOSE, UA: NEGATIVE
KETONES UA: NEGATIVE
Leukocytes, UA: NEGATIVE
Nitrite, UA: NEGATIVE
PH UA: 6.5 (ref 5.0–7.5)
Protein, UA: NEGATIVE
RBC UA: NEGATIVE
Specific Gravity, UA: 1.01 (ref 1.005–1.030)
UUROB: 0.2 mg/dL (ref 0.2–1.0)

## 2018-05-15 LAB — MICROSCOPIC EXAMINATION
Bacteria, UA: NONE SEEN
Epithelial Cells (non renal): NONE SEEN /hpf (ref 0–10)
RBC, UA: NONE SEEN /hpf (ref 0–2)
Renal Epithel, UA: NONE SEEN /hpf
WBC UA: NONE SEEN /HPF (ref 0–5)

## 2018-05-15 MED ORDER — TAMSULOSIN HCL 0.4 MG PO CAPS: 0.4000 mg | ORAL_CAPSULE | Freq: Every day | ORAL | 0 refills | Status: DC

## 2018-05-15 MED ORDER — OXYCODONE-ACETAMINOPHEN 5-325 MG PO TABS: 1.0000 | ORAL_TABLET | Freq: Four times a day (QID) | ORAL | 0 refills | Status: DC | PRN

## 2018-05-15 NOTE — Progress Notes (Signed)
  Subjective:   Patient ID: Jack [name], male    DOB: 1966/01/09, 52 y.o.   MRN: [id number] CC: ? kidney stones (Back pain and difficulty passing water)  HPI: Jack [name] is a 52 y.o. male   Trouble urinating past 4 days.  Sometimes he is able to pass urine without trouble.  Other times he is sitting than standing than sitting, changing position, straining, will get small amount of urine out and dribbles at a time.  He has noticed some pink-tinged urine off and on the last few days.  No dysuria.  Some pain in his right flank, radiates into his lower right abdomen.  Pain comes and goes in waves.  He has had kidney stones before.  This feels like his previous kidney stones.   Appetite is been slightly down because the pain.  Has a stool every 2 to 3 days, often hard, has to strain to pass.  He has taken Dulcolax in the past with good success.  Not taking it regularly.  He drinks large amounts of fluids regularly.  Relevant past medical, surgical, family and social history reviewed. Allergies and medications reviewed and updated. Social History   Tobacco Use  Smoking Status Never Smoker  Smokeless Tobacco Former NeurosurgeonUser  . Types: Chew   ROS: Per HPI   Objective:    BP 125/85   Pulse 70   Temp (!) 97.2 F (36.2 C) (Oral)   Ht 5\' 8"  (1.727 m)   Wt 208 lb (94.3 kg)   BMI 31.63 kg/m   Wt Readings from Last 3 Encounters:  05/15/18 208 lb (94.3 kg)  02/09/17 205 lb 6.4 oz (93.2 kg)  10/22/15 209 lb 6.4 oz (95 kg)    Gen: NAD, alert, cooperative with exam, NCAT EYES: EOMI, no conjunctival injection, or no icterus LYMPH: no cervical LAD CV: NRRR, normal S1/S2, no murmur, distal pulses 2+ b/l Resp: CTABL, no wheezes, normal WOB Abd: +BS, soft, tender to palpation lower right and middle abdomen, ND. no guarding or organomegaly Ext: No edema, warm Neuro: Alert and oriented, strength equal b/l UE and LE, coordination grossly normal MSK: normal muscle bulk  Assessment & Plan:    Jack [name] was seen today for ? kidney stones.  Diagnoses and all orders for this visit:  Flank pain -     DG Abd 1 View; Future -     Urinalysis, Complete -     Urine Culture  Urinary retention -     Ambulatory referral to Urology  Kidney stone Possible 3 mm kidney stone seen on x-ray.  Given amount of pain and how it is feeling like prior kidney stones, will treat with below.  He took tamsulosin with last kidney stone and he says it did help him a lot. -     oxyCODONE-acetaminophen (PERCOCET/ROXICET) 5-325 MG tablet; Take 1 tablet by mouth every 6 (six) hours as needed for severe pain. -     tamsulosin (FLOMAX) 0.4 MG CAPS capsule; Take 1 capsule (0.4 mg total) by mouth daily.  Constipation, unspecified constipation type Stool balls seen on x-ray at area of tenderness.. Discussed symptomatic care.  Increase fiber in diet.  Continue drinking plenty fluids.  MiraLAX daily, can titrate to goal 1-2 soft bowel movements daily.  Other orders -     Microscopic Examination   Follow up plan: Return if symptoms worsen or fail to improve. Rex Krasarol Vincent, MD [facility]

## 2018-05-16 LAB — URINE CULTURE: Organism ID, Bacteria: NO GROWTH

## 2018-05-24 ENCOUNTER — Encounter: Payer: Self-pay | Admitting: Family Medicine

## 2018-05-24 ENCOUNTER — Ambulatory Visit: Payer: BLUE CROSS/BLUE SHIELD | Admitting: Family Medicine

## 2018-05-24 ENCOUNTER — Ambulatory Visit (HOSPITAL_COMMUNITY)
Admission: RE | Admit: 2018-05-24 | Discharge: 2018-05-24 | Disposition: A | Discharge status: Home / Self Care | Payer: BLUE CROSS/BLUE SHIELD | Source: Ambulatory Visit | Attending: Family Medicine | Admitting: Family Medicine

## 2018-05-24 VITALS — BP 136/84 | HR 93 | Temp 97.4°F | Ht 68.0 in | Wt 201.0 lb

## 2018-05-24 DIAGNOSIS — N201 Calculus of ureter: Secondary | ICD-10-CM | POA: Diagnosis not present

## 2018-05-24 DIAGNOSIS — R109 Unspecified abdominal pain: Secondary | ICD-10-CM

## 2018-05-24 DIAGNOSIS — Z87442 Personal history of urinary calculi: Secondary | ICD-10-CM | POA: Insufficient documentation

## 2018-05-24 DIAGNOSIS — K5730 Diverticulosis of large intestine without perforation or abscess without bleeding: Secondary | ICD-10-CM | POA: Diagnosis not present

## 2018-05-24 NOTE — Progress Notes (Signed)
Subjective: CC: renal stone PCP: Junie SpencerHawks, Christy A, FNP ZOX:WRUEAVWHPI:Jack [name] is a 52 y.o. male presenting to clinic today for:  1. Renal stone Patient was seen on 05/15/2018 for flank pain.  He was found to have what appeared to be a 3 mm stone in the right pelvis on x-ray.  Differential diagnosis included fecalith versus ureteral stone.  He was discharged with Flomax and Percocet and a referral to urology.  Urology referral still pending.  Patient reports that since evaluation, he is continued to have right-sided intermittent flank pain.  Denies any fevers, nausea, vomiting, hematuria or dysuria.  He notes that he did have some blood in urine about 3 weeks ago but it has since resolved.  He does report that he has had 2 renal stones prior to this 1 in the past that he actually felt to pass.  He has not felt this one pass yet.   ROS: Per HPI  Allergies  Allergen Reactions  . Tramadol Nausea And Vomiting   Past Medical History:  Diagnosis Date  . Anxiety   . Arthritis   . GERD (gastroesophageal reflux disease)   . Headache    occasional   . Kidney stones [id number]    Current Outpatient Medications:  .  oxyCODONE-acetaminophen (PERCOCET/ROXICET) 5-325 MG tablet, Take 1 tablet by mouth every 6 (six) hours as needed for severe pain., Disp: 10 tablet, Rfl: 0 .  tamsulosin (FLOMAX) 0.4 MG CAPS capsule, Take 1 capsule (0.4 mg total) by mouth daily., Disp: 20 capsule, Rfl: 0 Social History   Socioeconomic History  . Marital status: Single    Spouse name: Not on file  . Number of children: Not on file  . Years of education: Not on file  . Highest education level: Not on file  Occupational History  . Not on file  Social Needs  . Financial resource strain: Not on file  . Food insecurity:    Worry: Not on file    Inability: Not on file  . Transportation needs:    Medical: Not on file    Non-medical: Not on file  Tobacco Use  . Smoking status: Never Smoker  . Smokeless tobacco:  Former NeurosurgeonUser    Types: Chew  Substance and Sexual Activity  . Alcohol use: Yes    Alcohol/week: 0.0 standard drinks    Comment: occasional   . Drug use: No  . Sexual activity: Not on file  Lifestyle  . Physical activity:    Days per week: Not on file    Minutes per session: Not on file  . Stress: Not on file  Relationships  . Social connections:    Talks on phone: Not on file    Gets together: Not on file    Attends religious service: Not on file    Active member of club or organization: Not on file    Attends meetings of clubs or organizations: Not on file    Relationship status: Not on file  . Intimate partner violence:    Fear of current or ex partner: Not on file    Emotionally abused: Not on file    Physically abused: Not on file    Forced sexual activity: Not on file  Other Topics Concern  . Not on file  Social History Narrative  . Not on file   No family history on file.  Objective: Office vital signs reviewed. BP 136/84   Pulse 93   Temp (!) 97.4 F (36.3 C) (  Oral)   Ht 5\' 8"  (1.727 m)   Wt 201 lb (91.2 kg)   BMI 30.56 kg/m   Physical Examination:  General: Awake, alert, well nourished, nontoxic. No acute distress MSK: +mild right sided CVA TTP  Assessment/ Plan: [age] y.o. male   1. Flank pain Patient is nontoxic.  He is afebrile.  Vital signs are within normal limits.  Physical exam was remarkable for right-sided CVA tenderness.  I reviewed his recent office note and imaging study which did demonstrate a 3 mm stone.  It was difficult to tell if this is a fecalith or a renal stone.  Given flank pain I favor a renal stone.  He has been using the Flomax and has not had resolution of symptoms yet.  He does not need any refills on Percocet.  He has yet to be contacted about urology.  I followed up on this and it appears that they will be contacting him today for an appointment.  Will obtain BMP, CBC and renal stone study to further evaluate.  If any evidence of  obstruction, low threshold to seek care in the emergency department for stat urology eval.  We discussed ways to reduce renal stones through diet.  Patient was good understanding.  He will follow with PCP as needed. - Basic Metabolic Panel - CBC with Differential - CT RENAL STONE STUDY; Future - Stone analysis; Future  2. History of renal stone - Basic Metabolic Panel - CBC with Differential - CT RENAL STONE STUDY; Future - Stone analysis; Future   Orders Placed This Encounter  Procedures  . Stone analysis    Standing Status:   Future    Standing Expiration Date:   05/25/2019  . CT RENAL STONE STUDY    Standing Status:   Future    Standing Expiration Date:   08/25/2019    Order Specific Question:   ** REASON FOR EXAM (FREE TEXT)    Answer:   Pt can leave    Order Specific Question:   Preferred imaging location?    Answer:   [facility]    Order Specific Question:   Call Results- Best Contact Number?    Answer:   [phone]    Order Specific Question:   Radiology Contrast Protocol - do NOT remove file path    Answer:   [email or web address]  . Basic Metabolic Panel  . CBC with Differential    Raliegh IpAshly M Gottschalk, DO [facility] [phone]

## 2018-05-24 NOTE — Patient Instructions (Signed)
Dietary Guidelines to Help Prevent Kidney Stones Kidney stones are deposits of minerals and salts that form inside your kidneys. Your risk of developing kidney stones may be greater depending on your diet, your lifestyle, the medicines you take, and whether you have certain medical conditions. Most people can reduce their chances of developing kidney stones by following the instructions below. Depending on your overall health and the type of kidney stones you tend to develop, your dietitian may give you more specific instructions. What are tips for following this plan? Reading food labels  Choose foods with "no salt added" or "low-salt" labels. Limit your sodium intake to less than 1500 mg per day.  Choose foods with calcium for each meal and snack. Try to eat about 300 mg of calcium at each meal. Foods that contain 200-500 mg of calcium per serving include: ? 8 oz (237 ml) of milk, fortified nondairy milk, and fortified fruit juice. ? 8 oz (237 ml) of kefir, yogurt, and soy yogurt. ? 4 oz (118 ml) of tofu. ? 1 oz of cheese. ? 1 cup (300 g) of dried figs. ? 1 cup (91 g) of cooked broccoli. ? 1-3 oz can of sardines or mackerel.  Most people need 1000 to 1500 mg of calcium each day. Talk to your dietitian about how much calcium is recommended for you. Shopping  Buy plenty of fresh fruits and vegetables. Most people do not need to avoid fruits and vegetables, even if they contain nutrients that may contribute to kidney stones.  When shopping for convenience foods, choose: ? Whole pieces of fruit. ? Premade salads with dressing on the side. ? Low-fat fruit and yogurt smoothies.  Avoid buying frozen meals or prepared deli foods.  Look for foods with live cultures, such as yogurt and kefir. Cooking  Do not add salt to food when cooking. Place a salt shaker on the table and allow each person to add his or her own salt to taste.  Use vegetable protein, such as beans, textured vegetable  protein (TVP), or tofu instead of meat in pasta, casseroles, and soups. Meal planning  Eat less salt, if told by your dietitian. To do this: ? Avoid eating processed or premade food. ? Avoid eating fast food.  Eat less animal protein, including cheese, meat, poultry, or fish, if told by your dietitian. To do this: ? Limit the number of times you have meat, poultry, fish, or cheese each week. Eat a diet free of meat at least 2 days a week. ? Eat only one serving each day of meat, poultry, fish, or seafood. ? When you prepare animal protein, cut pieces into small portion sizes. For most meat and fish, one serving is about the size of one deck of cards.  Eat at least 5 servings of fresh fruits and vegetables each day. To do this: ? Keep fruits and vegetables on hand for snacks. ? Eat 1 piece of fruit or a handful of berries with breakfast. ? Have a salad and fruit at lunch. ? Have two kinds of vegetables at dinner.  Limit foods that are high in a substance called oxalate. These include: ? Spinach. ? Rhubarb. ? Beets. ? Potato chips and french fries. ? Nuts.  If you regularly take a diuretic medicine, make sure to eat at least 1-2 fruits or vegetables high in potassium each day. These include: ? Avocado. ? Banana. ? Orange, prune, carrot, or tomato juice. ? Baked potato. ? Cabbage. ? Beans and split peas.   General instructions  Drink enough fluid to keep your urine clear or pale yellow. This is the most important thing you can do.  Talk to your health care provider and dietitian about taking daily supplements. Depending on your health and the cause of your kidney stones, you may be advised: ? Not to take supplements with vitamin C. ? To take a calcium supplement. ? To take a daily probiotic supplement. ? To take other supplements such as magnesium, fish oil, or vitamin B6.  Take all medicines and supplements as told by your health care provider.  Limit alcohol intake to no more  than 1 drink a day for nonpregnant women and 2 drinks a day for men. One drink equals 12 oz of beer, 5 oz of wine, or 1 oz of hard liquor.  Lose weight if told by your health care provider. Work with your dietitian to find strategies and an eating plan that works best for you. What foods are not recommended? Limit your intake of the following foods, or as told by your dietitian. Talk to your dietitian about specific foods you should avoid based on the type of kidney stones and your overall health. Grains Breads. Bagels. Rolls. Baked goods. Salted crackers. Cereal. Pasta. Vegetables Spinach. Rhubarb. Beets. Canned vegetables. Pickles. Olives. Meats and other protein foods Nuts. Nut butters. Large portions of meat, poultry, or fish. Salted or cured meats. Deli meats. Hot dogs. Sausages. Dairy Cheese. Beverages Regular soft drinks. Regular vegetable juice. Seasonings and other foods Seasoning blends with salt. Salad dressings. Canned soups. Soy sauce. Ketchup. Barbecue sauce. Canned pasta sauce. Casseroles. Pizza. Lasagna. Frozen meals. Potato chips. French fries. Summary  You can reduce your risk of kidney stones by making changes to your diet.  The most important thing you can do is drink enough fluid. You should drink enough fluid to keep your urine clear or pale yellow.  Ask your health care provider or dietitian how much protein from animal sources you should eat each day, and also how much salt and calcium you should have each day. This information is not intended to replace advice given to you by your health care provider. Make sure you discuss any questions you have with your health care provider. Document Released: 01/20/2011 Document Revised: 09/05/2016 Document Reviewed: 09/05/2016 Elsevier Interactive Patient Education  2018 [employer]  

## 2018-05-25 ENCOUNTER — Telehealth: Payer: Self-pay | Admitting: Family Medicine

## 2018-05-25 LAB — CBC WITH DIFFERENTIAL/PLATELET
BASOS ABS: 0 10*3/uL (ref 0.0–0.2)
Basos: 1 %
EOS (ABSOLUTE): 0.2 10*3/uL (ref 0.0–0.4)
Eos: 2 %
HEMOGLOBIN: 15.5 g/dL (ref 13.0–17.7)
Hematocrit: 45.6 % (ref 37.5–51.0)
IMMATURE GRANS (ABS): 0 10*3/uL (ref 0.0–0.1)
Immature Granulocytes: 0 %
LYMPHS: 22 %
Lymphocytes Absolute: 1.8 10*3/uL (ref 0.7–3.1)
MCH: 30.6 pg (ref 26.6–33.0)
MCHC: 34 g/dL (ref 31.5–35.7)
MCV: 90 fL (ref 79–97)
MONOCYTES: 6 %
Monocytes Absolute: 0.5 10*3/uL (ref 0.1–0.9)
NEUTROS ABS: 5.6 10*3/uL (ref 1.4–7.0)
Neutrophils: 69 %
Platelets: 310 10*3/uL (ref 150–450)
RBC: 5.06 x10E6/uL (ref 4.14–5.80)
RDW: 14.1 % (ref 12.3–15.4)
WBC: 8.2 10*3/uL (ref 3.4–10.8)

## 2018-05-25 LAB — BASIC METABOLIC PANEL
BUN/Creatinine Ratio: 7 — ABNORMAL LOW (ref 9–20)
BUN: 7 mg/dL (ref 6–24)
CHLORIDE: 104 mmol/L (ref 96–106)
CO2: 25 mmol/L (ref 20–29)
CREATININE: 0.94 mg/dL (ref 0.76–1.27)
Calcium: 9.1 mg/dL (ref 8.7–10.2)
GFR calc non Af Amer: 94 mL/min/{1.73_m2} (ref >59)
GFR, EST AFRICAN AMERICAN: 109 mL/min/{1.73_m2} (ref >59)
Glucose: 91 mg/dL (ref 65–99)
Potassium: 4.7 mmol/L (ref 3.5–5.2)
Sodium: 145 mmol/L — ABNORMAL HIGH (ref 134–144)

## 2018-05-27 ENCOUNTER — Other Ambulatory Visit: Payer: Self-pay | Admitting: Urology

## 2018-05-28 ENCOUNTER — Encounter (HOSPITAL_COMMUNITY): Payer: Self-pay | Admitting: General Practice

## 2018-05-30 ENCOUNTER — Ambulatory Visit (HOSPITAL_COMMUNITY)
Admission: RE | Admit: 2018-05-30 | Discharge: 2018-05-30 | Disposition: A | Discharge status: Home / Self Care | Payer: BLUE CROSS/BLUE SHIELD | Source: Ambulatory Visit | Attending: Urology | Admitting: Urology

## 2018-05-30 ENCOUNTER — Ambulatory Visit (HOSPITAL_COMMUNITY): Payer: BLUE CROSS/BLUE SHIELD

## 2018-05-30 ENCOUNTER — Encounter (HOSPITAL_COMMUNITY): Payer: Self-pay | Admitting: *Deleted

## 2018-05-30 ENCOUNTER — Encounter (HOSPITAL_COMMUNITY)
Admission: RE | Disposition: A | Discharge status: Home / Self Care | Payer: Self-pay | Source: Ambulatory Visit | Attending: Urology

## 2018-05-30 ENCOUNTER — Other Ambulatory Visit: Payer: Self-pay

## 2018-05-30 DIAGNOSIS — Z79891 Long term (current) use of opiate analgesic: Secondary | ICD-10-CM | POA: Diagnosis not present

## 2018-05-30 DIAGNOSIS — N132 Hydronephrosis with renal and ureteral calculous obstruction: Secondary | ICD-10-CM | POA: Insufficient documentation

## 2018-05-30 DIAGNOSIS — R109 Unspecified abdominal pain: Secondary | ICD-10-CM | POA: Diagnosis present

## 2018-05-30 DIAGNOSIS — Z6830 Body mass index (BMI) 30.0-30.9, adult: Secondary | ICD-10-CM | POA: Insufficient documentation

## 2018-05-30 DIAGNOSIS — E669 Obesity, unspecified: Secondary | ICD-10-CM | POA: Diagnosis not present

## 2018-05-30 DIAGNOSIS — N201 Calculus of ureter: Secondary | ICD-10-CM

## 2018-05-30 DIAGNOSIS — Z96652 Presence of left artificial knee joint: Secondary | ICD-10-CM | POA: Insufficient documentation

## 2018-05-30 HISTORY — PX: EXTRACORPOREAL SHOCK WAVE LITHOTRIPSY: SHX1557

## 2018-05-30 HISTORY — DX: Personal history of urinary calculi: Z87.442

## 2018-05-30 SURGERY — LITHOTRIPSY, ESWL
Anesthesia: LOCAL | Laterality: Right

## 2018-05-30 MED ORDER — CIPROFLOXACIN HCL 500 MG PO TABS
500.0000 mg | ORAL_TABLET | ORAL | Status: AC
  Administered 2018-05-30: 500 mg via ORAL
  Filled 2018-05-30: qty 1

## 2018-05-30 MED ORDER — DIAZEPAM 5 MG PO TABS
10.0000 mg | ORAL_TABLET | ORAL | Status: AC
  Administered 2018-05-30: 10 mg via ORAL
  Filled 2018-05-30: qty 2

## 2018-05-30 MED ORDER — SODIUM CHLORIDE 0.9 % IV SOLN
INTRAVENOUS | Status: DC
  Administered 2018-05-30: 09:00:00 via INTRAVENOUS

## 2018-05-30 MED ORDER — OXYCODONE HCL 5 MG PO TABS: 5.0000 mg | ORAL_TABLET | ORAL | Status: DC | PRN

## 2018-05-30 MED ORDER — DIPHENHYDRAMINE HCL 25 MG PO CAPS
25.0000 mg | ORAL_CAPSULE | ORAL | Status: AC
  Administered 2018-05-30: 25 mg via ORAL
  Filled 2018-05-30: qty 1

## 2018-05-30 NOTE — Progress Notes (Signed)
Dr. Marlou PorchHerrick notified of pt's complaints; pt's father very agreeable with taking pt home voicing understanding of pt condition and complaint

## 2018-05-30 NOTE — Progress Notes (Signed)
Pt has been up to bathroom to void x 2; urine clear-slightly red with small bloody fragments; pt very insistent stating "I need to pee, I am about to bust"; nurse attempting to explain pt having pressure due to procedure; bladder scan done = 6 ml in bladder; pt continues to be insistent stating" I am about to have a panic attack if I can't go to bathroom"; then pt willing to get in wheelchair to be discharged home with father

## 2018-05-30 NOTE — Discharge Instructions (Signed)
See [facility] discharge instructions in chart.  

## 2018-05-30 NOTE — Op Note (Signed)
See [facility] OP note scanned into chart. Also because of the size, density, location and other factors that cannot be anticipated I feel this will likely be a staged procedure. This fact supersedes any indication in the scanned [facility] operative note to the contrary.  

## 2018-05-30 NOTE — Op Note (Signed)
CC: Right flank pain   HPI: Jack [name] is a 52 year old male with a history of kidney stones (passed two stones 6 years ago) who presents with a 3 week history of intermittent, sharp, non-radiating right inguinal and flank pain. He had a CTSS on 05/15/18 that demonstrated a 5 mm right distal ureteral stone w/o hydronephrosis and was otherwise unremarkable. He also had a KUB on 8/16 with a visible distal right ureteral stone.   Today, he states that he is still having intermittent episodes of flank pain that is partially alleviated with oxycodone and tylenol. He took Aleve serveral weeks ago with some improvement. Denies N/V/F/C. He does report on-going urinary urgency and frequency that started around the same time as his kidney stone pain. He is currently on tamsulosin, which he states has marginally helped his pain/urinary sxs. Denies interval UTIs, dysuria or hematuria. UA on 05/24/18 was clear. AFVSS today.   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 He is anxious to have his stone treated so that he doesn't have to miss anymore work.     ALLERGIES: Tramadol    MEDICATIONS: Tamsulosin Hcl 0.4 mg capsule  Oxycodone Hcl     Notes: MEDICATIONS UPDATED TODAY. AT    GU PSH: None   NON-GU PSH: Shoulder Surgery (Unspecified), dislocation Total Knee Replacement, Left    GU PMH: None   NON-GU PMH: Anxiety Arrhythmia GERD    FAMILY HISTORY: Colon Cancer - Aunt Kidney Stones - Father   SOCIAL HISTORY: Marital Status: Divorced Preferred Language: English; Ethnicity: Not Hispanic Or Latino; Race: White Current Smoking Status: Patient has never smoked.   Tobacco Use Assessment Completed: Used Tobacco in last 30 days? Types of alcohol consumed: Liquor.  Has not had a blood transfusion.    REVIEW OF SYSTEMS:    GU Review Male:   Patient reports frequent urination, hard to postpone urination, burning/ pain with urination, get up at night to urinate, leakage of urine, stream starts and stops,  trouble starting your stream, and have to strain to urinate . Patient denies erection problems and penile pain.  Gastrointestinal (Upper):   Patient reports nausea and vomiting. Patient denies indigestion/ heartburn.  Gastrointestinal (Lower):   Patient denies diarrhea and constipation.  Constitutional:   Patient denies fever, night sweats, weight loss, and fatigue.  Skin:   Patient denies skin rash/ lesion and itching.  Eyes:   Patient denies blurred vision and double vision.  Ears/ Nose/ Throat:   Patient denies sore throat and sinus problems.  Hematologic/Lymphatic:   Patient denies swollen glands and easy bruising.  Cardiovascular:   Patient denies leg swelling and chest pains.  Respiratory:   Patient denies cough and shortness of breath.  Endocrine:   Patient denies excessive thirst.  Musculoskeletal:   Patient reports back pain and joint pain.   Neurological:   Patient denies headaches and dizziness.  Psychologic:   Patient denies depression and anxiety.   Notes: PT states he is having frequent urination. burning and discomfort with urination. back pain. pressure and at times having to strain to urinate. AT     VITAL SIGNS:      05/27/2018 09:26 AM  Weight 200 lb / 90.72 kg  Height 68 in / 172.72 cm  BP 125/88 mmHg  Pulse 112 /min  Temperature 97.8 F / 36.5 C  BMI 30.4 kg/m   MULTI-SYSTEM PHYSICAL EXAMINATION:    Constitutional: Well-nourished. No physical deformities. Normally developed. Good grooming.  Neck: Neck symmetrical,  not swollen. Normal tracheal position.  Respiratory: No labored breathing, no use of accessory muscles.   Cardiovascular: Normal temperature, normal extremity pulses, no swelling, no varicosities.  Lymphatic: No enlargement of neck, axillae, groin.  Skin: No paleness, no jaundice, no cyanosis. No lesion, no ulcer, no rash.  Neurologic / Psychiatric: Oriented to time, oriented to place, oriented to person. No depression, no anxiety, no agitation.   Gastrointestinal: No mass, no tenderness, no rigidity, non obese abdomen.  Eyes: Normal conjunctivae. Normal eyelids.  Ears, Nose, Mouth, and Throat: Left ear no scars, no lesions, no masses. Right ear no scars, no lesions, no masses. Nose no scars, no lesions, no masses. Normal hearing. Normal lips.  Musculoskeletal: Normal gait and station of head and neck.     PAST DATA REVIEWED:  Source Of History:  Patient   PROCEDURES:          Urinalysis w/Scope - 81001 Dipstick Dipstick Cont'd Micro  Color: Amber Bilirubin: Neg WBC/hpf: 0 - 5/hpf  Appearance: Cloudy Ketones: Neg RBC/hpf: 3 - 10/hpf  Specific Gravity: 1.020 Blood: 1+ Bacteria: Rare (0-9/hpf)  pH: 6.0 Protein: 1+ Cystals: NS (Not Seen)  Glucose: Neg Urobilinogen: 0.2 Casts: NS (Not Seen)    Nitrites: Neg Trichomonas: Not Present    Leukocyte Esterase: Neg Mucous: Present      Epithelial Cells: 0 - 5/hpf      Yeast: NS (Not Seen)      Sperm: Not Present    Notes:      ASSESSMENT:      ICD-10 Details  1 GU:   Ureteral calculus - N20.1 5 mm right distal ureteral stone with no hydro on CT from 05/24/18  2   Renal colic - N23 Right  3   History of urolithiasis - Z87.442    PLAN:           Schedule Return Visit/Planned Activity: ASAP - Schedule Surgery          Document Letter(s):  Created for Patient: Clinical Summary   Created for Rex Krasarol Vincent, MD         Notes:   We discussed the management of kidney stones. These options include observation, ureteroscopy, shockwave lithotripsy (ESWL) and percutaneous nephrolithotomy (PCNL). We discussed which options are relevant to the patient's stone(s). We discussed the natural history of kidney stones as well as the complications of untreated stones and the impact on quality of life without treatment as well as with each of the above listed treatments. We also discussed the efficacy of each treatment in its ability to clear the stone burden. With any of these management options  I discussed the signs and symptoms of infection and the need for emergent treatment should these be experienced. For each option we discussed the ability of each procedure to clear the patient of their stone burden.   For observation I described the risks which include but are not limited to silent renal damage, life-threatening infection, need for emergent surgery, failure to pass stone and pain.   For ureteroscopy I described the risks which include bleeding, infection, damage to contiguous structures, positioning injury, ureteral stricture, ureteral avulsion, ureteral injury, need for prolonged ureteral stent, inability to perform ureteroscopy, need for an interval procedure, inability to clear stone burden, stent discomfort/pain, heart attack, stroke, pulmonary embolus and the inherent risks with general anesthesia.   For shockwave lithotripsy I described the risks which include arrhythmia, kidney contusion, kidney hemorrhage, need for transfusion, pain, inability to adequately break up stone,  inability to pass stone fragments, Steinstrasse, infection associated with obstructing stones, need for alternate surgical procedure, need for repeat shockwave lithotripsy, MI, CVA, PE and the inherent risks with anesthesia/conscious sedation.   -He would like to proceed with RIGHT ESWL. We discussed criteria to return to clinic or proceed to the ER which include: Fever/chills, worsening pain, nausea/vomiting and/or persistent gross hematuria. He voices understanding.

## 2018-05-31 NOTE — H&P (Signed)
CC: Right flank pain   HPI: Jack [name] is a 52 year old male with a history of kidney stones (passed two stones 6 years ago) who presents with a 3 week history of intermittent, sharp, non-radiating right inguinal and flank pain. He had a CTSS on 05/15/18 that demonstrated a 5 mm right distal ureteral stone w/o hydronephrosis and was otherwise unremarkable. He also had a KUB on 8/16 with a visible distal right ureteral stone.   Today, he states that he is still having intermittent episodes of flank pain that is partially alleviated with oxycodone and tylenol. He took Aleve serveral weeks ago with some improvement. Denies N/V/F/C. He does report on-going urinary urgency and frequency that started around the same time as his kidney stone pain. He is currently on tamsulosin, which he states has marginally helped his pain/urinary sxs. Denies interval UTIs, dysuria or hematuria. UA on 05/24/18 was clear. AFVSS today.   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 He is anxious to have his stone treated so that he doesn't have to miss anymore work.     ALLERGIES: Tramadol    MEDICATIONS: Tamsulosin Hcl 0.4 mg capsule  Oxycodone Hcl     Notes: MEDICATIONS UPDATED TODAY. AT    GU PSH: None   NON-GU PSH: Shoulder Surgery (Unspecified), dislocation Total Knee Replacement, Left    GU PMH: None   NON-GU PMH: Anxiety Arrhythmia GERD    FAMILY HISTORY: Colon Cancer - Aunt Kidney Stones - Father   SOCIAL HISTORY: Marital Status: Divorced Preferred Language: English; Ethnicity: Not Hispanic Or Latino; Race: White Current Smoking Status: Patient has never smoked.   Tobacco Use Assessment Completed: Used Tobacco in last 30 days? Types of alcohol consumed: Liquor.  Has not had a blood transfusion.    REVIEW OF SYSTEMS:    GU Review Male:   Patient reports frequent urination, hard to postpone urination, burning/ pain with urination, get up at night to urinate, leakage of urine, stream starts and stops,  trouble starting your stream, and have to strain to urinate . Patient denies erection problems and penile pain.  Gastrointestinal (Upper):   Patient reports nausea and vomiting. Patient denies indigestion/ heartburn.  Gastrointestinal (Lower):   Patient denies diarrhea and constipation.  Constitutional:   Patient denies fever, night sweats, weight loss, and fatigue.  Skin:   Patient denies skin rash/ lesion and itching.  Eyes:   Patient denies blurred vision and double vision.  Ears/ Nose/ Throat:   Patient denies sore throat and sinus problems.  Hematologic/Lymphatic:   Patient denies swollen glands and easy bruising.  Cardiovascular:   Patient denies leg swelling and chest pains.  Respiratory:   Patient denies cough and shortness of breath.  Endocrine:   Patient denies excessive thirst.  Musculoskeletal:   Patient reports back pain and joint pain.   Neurological:   Patient denies headaches and dizziness.  Psychologic:   Patient denies depression and anxiety.   Notes: PT states he is having frequent urination. burning and discomfort with urination. back pain. pressure and at times having to strain to urinate. AT     VITAL SIGNS:      05/27/2018 09:26 AM  Weight 200 lb / 90.72 kg  Height 68 in / 172.72 cm  BP 125/88 mmHg  Pulse 112 /min  Temperature 97.8 F / 36.5 C  BMI 30.4 kg/m   MULTI-SYSTEM PHYSICAL EXAMINATION:    Constitutional: Well-nourished. No physical deformities. Normally developed. Good grooming.  Neck: Neck symmetrical,   not swollen. Normal tracheal position.  Respiratory: No labored breathing, no use of accessory muscles.   Cardiovascular: Normal temperature, normal extremity pulses, no swelling, no varicosities.  Lymphatic: No enlargement of neck, axillae, groin.  Skin: No paleness, no jaundice, no cyanosis. No lesion, no ulcer, no rash.  Neurologic / Psychiatric: Oriented to time, oriented to place, oriented to person. No depression, no anxiety, no agitation.   Gastrointestinal: No mass, no tenderness, no rigidity, non obese abdomen.  Eyes: Normal conjunctivae. Normal eyelids.  Ears, Nose, Mouth, and Throat: Left ear no scars, no lesions, no masses. Right ear no scars, no lesions, no masses. Nose no scars, no lesions, no masses. Normal hearing. Normal lips.  Musculoskeletal: Normal gait and station of head and neck.     PAST DATA REVIEWED:  Source Of History:  Patient   PROCEDURES:          Urinalysis w/Scope - 81001 Dipstick Dipstick Cont'd Micro  Color: Amber Bilirubin: Neg WBC/hpf: 0 - 5/hpf  Appearance: Cloudy Ketones: Neg RBC/hpf: 3 - 10/hpf  Specific Gravity: 1.020 Blood: 1+ Bacteria: Rare (0-9/hpf)  pH: 6.0 Protein: 1+ Cystals: NS (Not Seen)  Glucose: Neg Urobilinogen: 0.2 Casts: NS (Not Seen)    Nitrites: Neg Trichomonas: Not Present    Leukocyte Esterase: Neg Mucous: Present      Epithelial Cells: 0 - 5/hpf      Yeast: NS (Not Seen)      Sperm: Not Present    Notes:      ASSESSMENT:      ICD-10 Details  1 GU:   Ureteral calculus - N20.1 5 mm right distal ureteral stone with no hydro on CT from 05/24/18  2   Renal colic - N23 Right  3   History of urolithiasis - Z87.442    PLAN:           Schedule Return Visit/Planned Activity: ASAP - Schedule Surgery          Document Letter(s):  Created for Patient: Clinical Summary   Created for Carol Vincent, MD         Notes:   We discussed the management of kidney stones. These options include observation, ureteroscopy, shockwave lithotripsy (ESWL) and percutaneous nephrolithotomy (PCNL). We discussed which options are relevant to the patient's stone(s). We discussed the natural history of kidney stones as well as the complications of untreated stones and the impact on quality of life without treatment as well as with each of the above listed treatments. We also discussed the efficacy of each treatment in its ability to clear the stone burden. With any of these management options  I discussed the signs and symptoms of infection and the need for emergent treatment should these be experienced. For each option we discussed the ability of each procedure to clear the patient of their stone burden.   For observation I described the risks which include but are not limited to silent renal damage, life-threatening infection, need for emergent surgery, failure to pass stone and pain.   For ureteroscopy I described the risks which include bleeding, infection, damage to contiguous structures, positioning injury, ureteral stricture, ureteral avulsion, ureteral injury, need for prolonged ureteral stent, inability to perform ureteroscopy, need for an interval procedure, inability to clear stone burden, stent discomfort/pain, heart attack, stroke, pulmonary embolus and the inherent risks with general anesthesia.   For shockwave lithotripsy I described the risks which include arrhythmia, kidney contusion, kidney hemorrhage, need for transfusion, pain, inability to adequately break up stone,   inability to pass stone fragments, Steinstrasse, infection associated with obstructing stones, need for alternate surgical procedure, need for repeat shockwave lithotripsy, MI, CVA, PE and the inherent risks with anesthesia/conscious sedation.   -He would like to proceed with RIGHT ESWL. We discussed criteria to return to clinic or proceed to the ER which include: Fever/chills, worsening pain, nausea/vomiting and/or persistent gross hematuria. He voices understanding.    

## 2018-07-29 ENCOUNTER — Encounter (HOSPITAL_COMMUNITY): Payer: Self-pay | Admitting: Urology

## 2019-05-12 IMAGING — CR DG ABDOMEN 1V
2 series · 2 of 2 positions shown · non-contrast
Comparison: CT from 05/24/2018

CLINICAL DATA: Follow-up right ureteral stone

EXAM:
ABDOMEN - 1 VIEW

[t abdomen supine (1 of 2)]
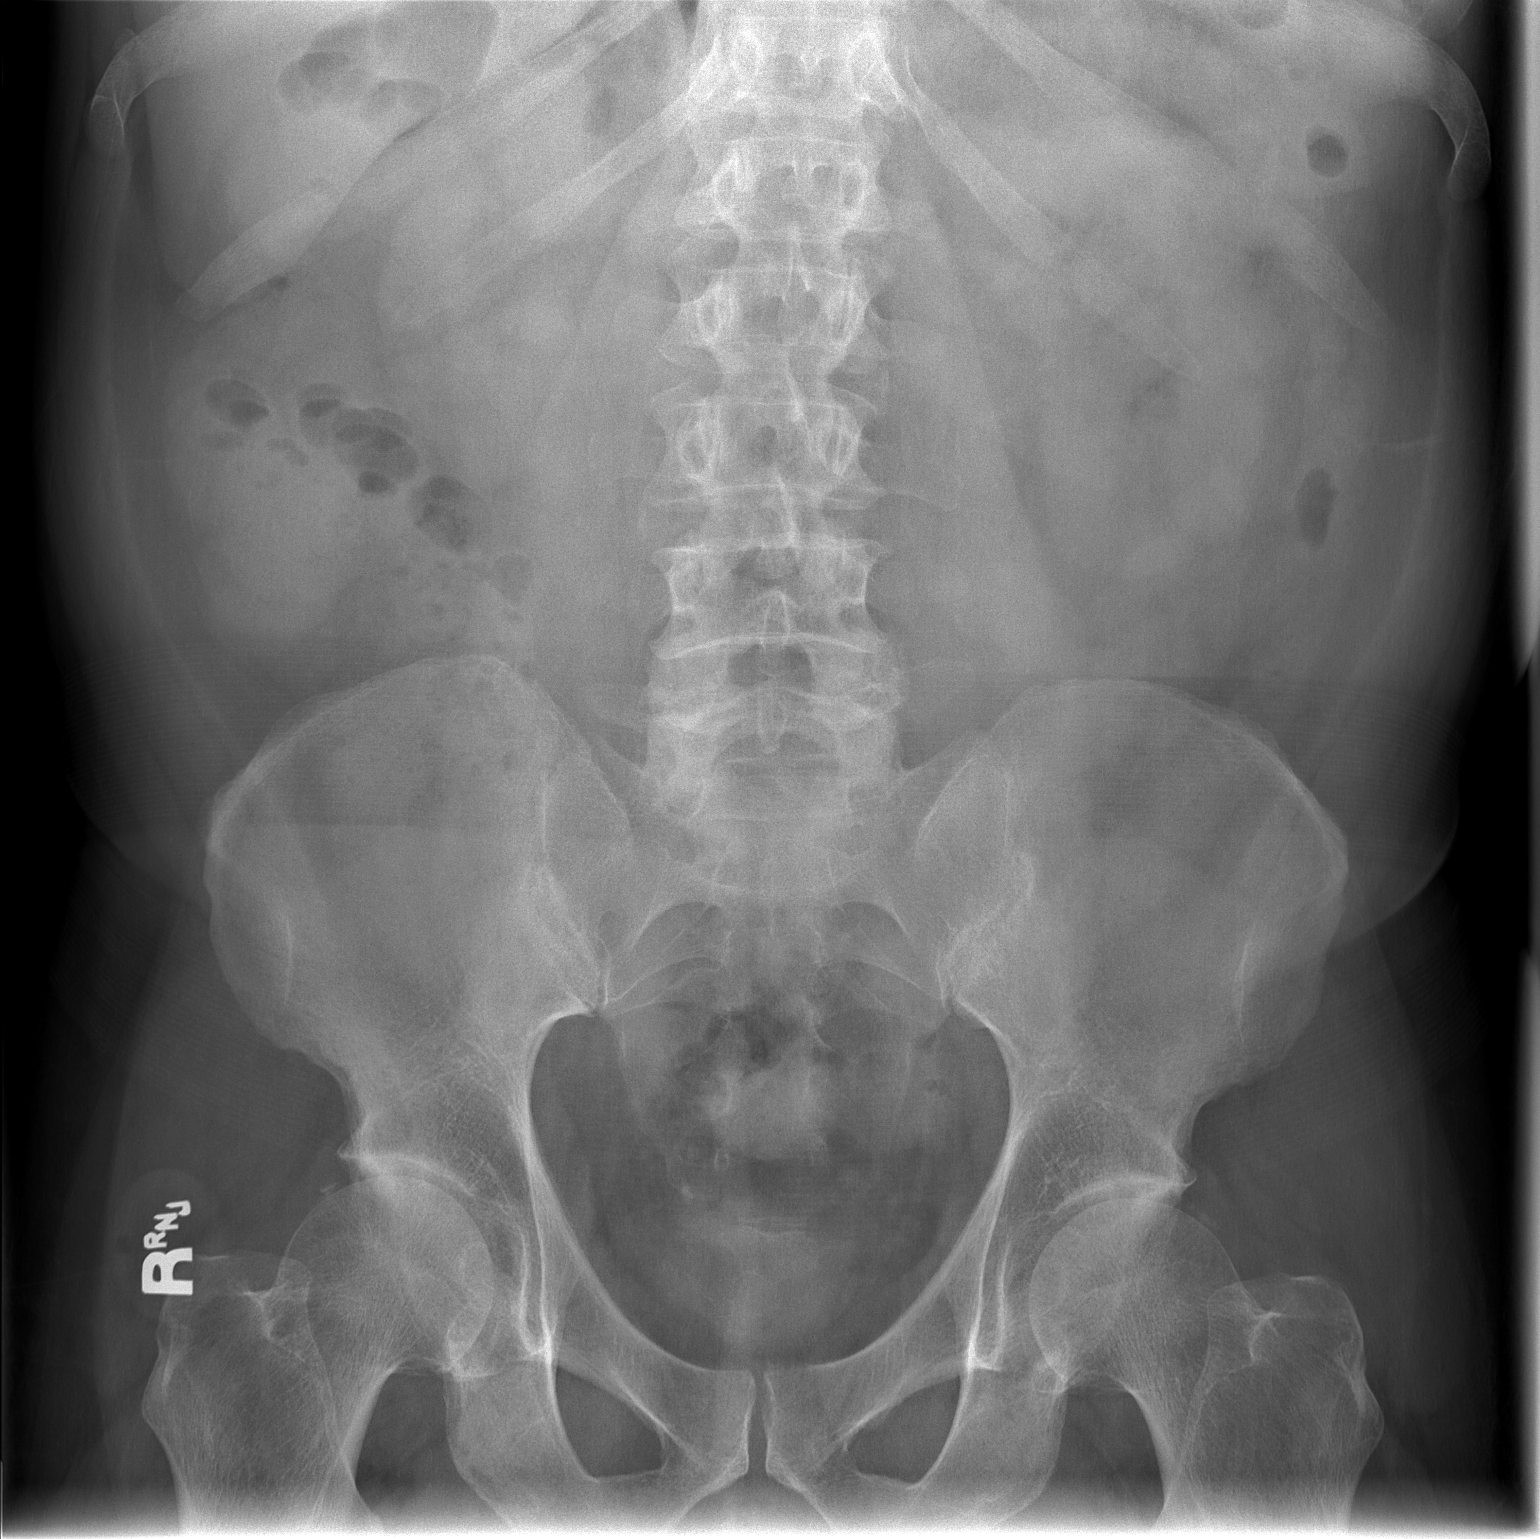

[t abdomen supine (2 of 2)]
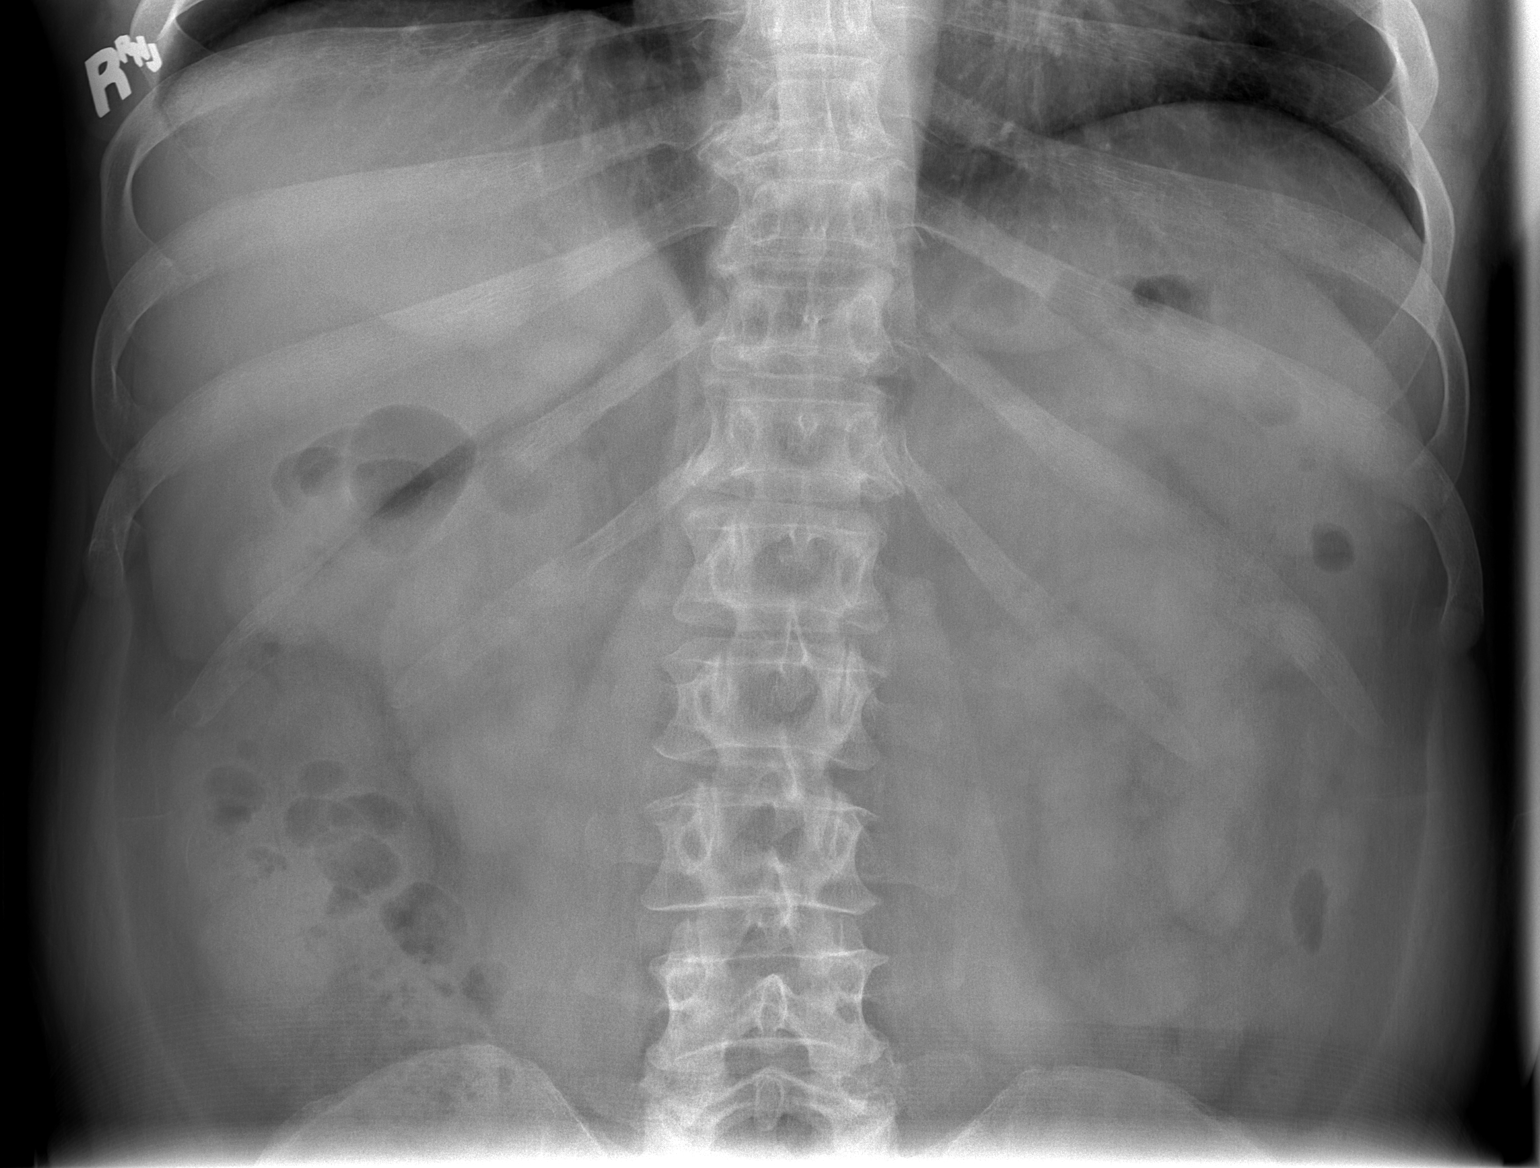

[2 of 2 positions shown; findings below may reference images not displayed]

FINDINGS: Scattered large and small bowel gas is noted. No renal calculi are
noted. The known distal right ureteral stone is again identified
with minimal distal migration. No bony abnormality is seen.
IMPRESSION: Right ureteral stone is again noted as described.

## 2019-05-23 ENCOUNTER — Other Ambulatory Visit: Payer: Self-pay

## 2019-05-23 DIAGNOSIS — Z20822 Contact with and (suspected) exposure to covid-19: Secondary | ICD-10-CM

## 2019-05-25 LAB — NOVEL CORONAVIRUS, NAA: SARS-CoV-2, NAA: NOT DETECTED

## 2019-06-20 ENCOUNTER — Telehealth: Payer: Self-pay | Admitting: Family Medicine

## 2019-06-20 NOTE — Telephone Encounter (Signed)
I asked him to call his surgeon, but he said the knee is fine and the surgeon did not give him the brace. He wears it to work under long pants and it is tormenting him to death due to itching. We have no appts, please advise

## 2019-06-20 NOTE — Telephone Encounter (Signed)
Pt aware.

## 2019-06-20 NOTE — Telephone Encounter (Signed)
Advise discontinue use of brace.  Can use Zyrtec daily.  Ok to use cortisone cream if needed.  If needs to be seen, maybe same day appt?

## 2019-07-01 ENCOUNTER — Other Ambulatory Visit: Payer: Self-pay

## 2019-07-02 ENCOUNTER — Ambulatory Visit (INDEPENDENT_AMBULATORY_CARE_PROVIDER_SITE_OTHER): Payer: BC Managed Care – PPO | Admitting: Family Medicine

## 2019-07-02 ENCOUNTER — Encounter: Payer: Self-pay | Admitting: Family Medicine

## 2019-07-02 VITALS — BP 122/88 | HR 64 | Temp 97.8°F | Ht 68.0 in | Wt 198.0 lb

## 2019-07-02 DIAGNOSIS — M25562 Pain in left knee: Secondary | ICD-10-CM

## 2019-07-02 DIAGNOSIS — E559 Vitamin D deficiency, unspecified: Secondary | ICD-10-CM

## 2019-07-02 DIAGNOSIS — M25561 Pain in right knee: Secondary | ICD-10-CM

## 2019-07-02 DIAGNOSIS — Z0001 Encounter for general adult medical examination with abnormal findings: Secondary | ICD-10-CM

## 2019-07-02 DIAGNOSIS — Z800 Family history of malignant neoplasm of digestive organs: Secondary | ICD-10-CM

## 2019-07-02 DIAGNOSIS — Z1211 Encounter for screening for malignant neoplasm of colon: Secondary | ICD-10-CM

## 2019-07-02 DIAGNOSIS — E669 Obesity, unspecified: Secondary | ICD-10-CM

## 2019-07-02 DIAGNOSIS — G8929 Other chronic pain: Secondary | ICD-10-CM

## 2019-07-02 DIAGNOSIS — N400 Enlarged prostate without lower urinary tract symptoms: Secondary | ICD-10-CM

## 2019-07-02 DIAGNOSIS — Z0000 Encounter for general adult medical examination without abnormal findings: Secondary | ICD-10-CM

## 2019-07-02 LAB — BAYER DCA HB A1C WAIVED: HB A1C (BAYER DCA - WAIVED): 5.2 % (ref <7.0)

## 2019-07-02 MED ORDER — DICLOFENAC SODIUM 1 % TD GEL: 4.0000 g | Freq: Four times a day (QID) | TRANSDERMAL | 3 refills | Status: DC

## 2019-07-02 NOTE — Progress Notes (Signed)
Jack [name] is a 53 y.o. male presents to office today for annual physical exam examination.    Concerns today include: 1.  Chronic knee and back pain. Patient sees Dr. Ricki Rodriguez for chronic knee pain.  He actually had 2 surgeries in the right knee in 2020.  He had a medial meniscal injury which required repair.  He has also history of total knee replacement.  He notes he still has quite a bit of pain in bilateral knees with right greater than left.  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  He knows that this type of activity will cause further wear and tear on his joints and ultimately require repeat surgery.  His chronic pain bothers him quite a bit.  He uses oral analgesics OTC and also has a small amount of Norco which he reserves for severe pain.  He only takes half a tablet of that if needed.  He reports instability of the knees.  He has follow-up with orthopedist next week.  Occupation: Agricultural consultant Diet: fair, Exercise: active at work. Last eye exam: needs. Last colonoscopy: >10 years. Aunt died from colon cancer Immunizations needed: Flu Vaccine declined  Past Medical History:  Diagnosis Date  . Anxiety   . Arthritis   . GERD (gastroesophageal reflux disease)   . Headache    occasional   . History of kidney stones   . Kidney stones [id number]   Social History   Socioeconomic History  . Marital status: Single    Spouse name: Not on file  . Number of children: Not on file  . Years of education: Not on file  . Highest education level: Not on file  Occupational History  . Not on file  Social Needs  . Financial resource strain: Not on file  . Food insecurity    Worry: Not on file    Inability: Not on file  . Transportation needs    Medical: Not on file    Non-medical: Not on file  Tobacco Use  . Smoking status: Never Smoker  . Smokeless tobacco: Former Systems developer    Types: Chew  Substance and Sexual Activity  . Alcohol use:  Yes    Alcohol/week: 0.0 standard drinks    Comment: occasional   . Drug use: No  . Sexual activity: Not on file  Lifestyle  . Physical activity    Days per week: Not on file    Minutes per session: Not on file  . Stress: Not on file  Relationships  . Social Herbalist on phone: Not on file    Gets together: Not on file    Attends religious service: Not on file    Active member of club or organization: Not on file    Attends meetings of clubs or organizations: Not on file    Relationship status: Not on file  . Intimate partner violence    Fear of current or ex partner: Not on file    Emotionally abused: Not on file    Physically abused: Not on file    Forced sexual activity: Not on file  Other Topics Concern  . Not on file  Social History Narrative  . Not on file   Past Surgical History:  Procedure Laterality Date  . EXTRACORPOREAL SHOCK WAVE LITHOTRIPSY Right 05/30/2018   Procedure: RIGHT EXTRACORPOREAL SHOCK WAVE LITHOTRIPSY (ESWL);  Surgeon: Ardis Hughs, MD;  Location: [facility];  Service:  Urology;  Laterality: Right;  . KNEE SURGERY Left    3 arthroscopic,  two ACL reconstuctions  . SHOULDER SURGERY Right    dislocation repair with a pin  . TONSILLECTOMY AND ADENOIDECTOMY    . TOTAL KNEE ARTHROPLASTY Left 07/12/2015   Procedure: TOTAL LEFT  KNEE ARTHROPLASTY;  Surgeon: Gaynelle Arabian, MD;  Location: [facility];  Service: Orthopedics;  Laterality: Left;  . tubes in ears     No family history on file.  Current Outpatient Medications:  .  tamsulosin (FLOMAX) 0.4 MG CAPS capsule, Take 1 capsule (0.4 mg total) by mouth daily., Disp: 20 capsule, Rfl: 0  Allergies  Allergen Reactions  . Tramadol Nausea And Vomiting     ROS: Review of Systems Constitutional: negative Eyes: positive for contacts/glasses Ears, nose, mouth, throat, and face: negative Respiratory: negative Cardiovascular: negative Gastrointestinal: negative Genitourinary:negative  Integument/breast: negative Hematologic/lymphatic: negative Musculoskeletal:positive for arthralgias, back pain and stiff joints Neurological: negative Behavioral/Psych: negative Endocrine: negative Allergic/Immunologic: negative    Physical exam BP 122/88   Pulse 64   Temp 97.8 F (36.6 C) (Oral)   Ht 5' 8"  (1.727 m)   Wt 198 lb (89.8 kg)   BMI 30.11 kg/m  General appearance: alert, cooperative and appears stated age Head: Normocephalic, without obvious abnormality, atraumatic Eyes: negative findings: lids and lashes normal, conjunctivae and sclerae normal, corneas clear and pupils equal, round, reactive to light and accomodation Ears: normal TM's and external ear canals both ears Nose: Nares normal. Septum midline. Mucosa normal. No drainage or sinus tenderness. Throat: lips, mucosa, and tongue normal; teeth and gums normal Neck: no adenopathy, no carotid bruit, no JVD and thyroid not enlarged, symmetric, no tenderness/mass/nodules Back: symmetric, no curvature. ROM normal. No CVA tenderness. Lungs: clear to auscultation bilaterally Chest wall: no tenderness Heart: regular rate and rhythm, S1, S2 normal, no murmur, click, rub or gallop Abdomen: soft, non-tender; bowel sounds normal; no masses,  no organomegaly Extremities:upper extremities normal, atraumatic, no cyanosis or edema; he has postsurgical changes noted in the abdomen. Pulses: 2+ and symmetric Skin: Skin color, texture, turgor normal. No rashes or lesions Lymph nodes: Cervical, supraclavicular, and axillary nodes normal. Neurologic: Alert and oriented X 3, normal strength and tone. Normal symmetric reflexes. Normal coordination and gait Psych: Intermittently tearful when discussing his pain.  Affect appropriate.  Thought process linear.  Pleasant and interactive Depression screen [facility] 2/9 07/02/2019 05/24/2018 05/15/2018  Decreased Interest 1 0 1  Down, Depressed, Hopeless 1 0 0  PHQ - 2 Score 2 0 1  Altered sleeping 1  - -  Tired, decreased energy 1 - -  Change in appetite - - -  Feeling bad or failure about yourself  0 - -  Trouble concentrating 1 - -  Moving slowly or fidgety/restless 0 - -  Suicidal thoughts - - -  PHQ-9 Score 5 - -  Difficult doing work/chores - - -    Assessment/ Plan: Jack [name] here for annual physical exam.   1. Annual physical exam  2. Vitamin D deficiency - VITAMIN D 25 Hydroxy (Vit-D Deficiency, Fractures)  3. Benign prostatic hyperplasia, unspecified whether lower urinary tract symptoms present Asymptomatic - PSA  4. Obesity (BMI 30-39.9) - CMP14+EGFR - Lipid Panel - TSH - Bayer DCA Hb A1c Waived  5. Chronic pain of both knees Counseled him on various therapies.  Could consider referral to [facility] but I discussed with him this is may be somewhat soon status post surgery to consider evaluation by  alternative specialist.  I will give him topical Voltaren to apply to the affected areas of the 4 times daily to help with pain and inflammation.  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 - diclofenac sodium (VOLTAREN) 1 % GEL; Apply 4 g topically 4 (four) times daily. (for knee pain)  Dispense: 400 g; Refill: 3  6. Screen for colon cancer Asymptomatic.  He has an aunt with a history of colon cancer - Ambulatory referral to Gastroenterology  7. Family history of colon cancer - Ambulatory referral to Gastroenterology   Counseled on healthy lifestyle choices, including diet (rich in fruits, vegetables and lean meats and low in salt and simple carbohydrates) and exercise (at least 30 minutes of moderate physical activity daily).  Patient to follow up in 1 year for annual exam or sooner if needed.  Jaquelinne Glendening M. Lajuana Ripple, DO

## 2019-07-02 NOTE — Patient Instructions (Addendum)
You had labs performed today.  You will be contacted with the results of the labs once they are available, usually in the next 3 business days for routine lab work.  If you have an active my chart account, they will be released to your MyChart.  If you prefer to have these labs released to you via telephone, please let us know.  If you had a pap smear or biopsy performed, expect to be contacted in about 7-10 days.   I have sent in generic voltaren gel.  If the cost of the co-pay is too high, get the over-the-counter version.  You can apply this gel to both knees up to 4 times daily if needed for pain.  Preventive Care 58-46 Years Old, Male Preventive care refers to lifestyle choices and visits with your health care provider that can promote health and wellness. This includes:  A yearly physical exam. This is also called an annual well check.  Regular dental and eye exams.  Immunizations.  Screening for certain conditions.  Healthy lifestyle choices, such as eating a healthy diet, getting regular exercise, not using drugs or products that contain nicotine and tobacco, and limiting alcohol use. What can I expect for my preventive care visit? Physical exam Your health care provider will check:  Height and weight. These may be used to calculate body mass index (BMI), which is a measurement that tells if you are at a healthy weight.  Heart rate and blood pressure.  Your skin for abnormal spots. Counseling Your health care provider may ask you questions about:  Alcohol, tobacco, and drug use.  Emotional well-being.  Home and relationship well-being.  Sexual activity.  Eating habits.  Work and work Statistician. What immunizations do I need?  Influenza (flu) vaccine  This is recommended every year. Tetanus, diphtheria, and pertussis (Tdap) vaccine  You may need a Td booster every 10 years. Varicella (chickenpox) vaccine  You may need this vaccine if you have not already  been vaccinated. Zoster (shingles) vaccine  You may need this after age 6. Measles, mumps, and rubella (MMR) vaccine  You may need at least one dose of MMR if you were born in 1957 or later. You may also need a second dose. Pneumococcal conjugate (PCV13) vaccine  You may need this if you have certain conditions and were not previously vaccinated. Pneumococcal polysaccharide (PPSV23) vaccine  You may need one or two doses if you smoke cigarettes or if you have certain conditions. Meningococcal conjugate (MenACWY) vaccine  You may need this if you have certain conditions. Hepatitis A vaccine  You may need this if you have certain conditions or if you travel or work in places where you may be exposed to hepatitis A. Hepatitis B vaccine  You may need this if you have certain conditions or if you travel or work in places where you may be exposed to hepatitis B. Haemophilus influenzae type b (Hib) vaccine  You may need this if you have certain risk factors. Human papillomavirus (HPV) vaccine  If recommended by your health care provider, you may need three doses over 6 months. You may receive vaccines as individual doses or as more than one vaccine together in one shot (combination vaccines). Talk with your health care provider about the risks and benefits of combination vaccines. What tests do I need? Blood tests  Lipid and cholesterol levels. These may be checked every 5 years, or more frequently if you are over 40 years old.  Hepatitis C  test.  Hepatitis B test. Screening  Lung cancer screening. You may have this screening every year starting at age 7 if you have a 30-pack-year history of smoking and currently smoke or have quit within the past 15 years.  Prostate cancer screening. Recommendations will vary depending on your family history and other risks.  Colorectal cancer screening. All adults should have this screening starting at age 68 and continuing until age 68. Your  health care provider may recommend screening at age 76 if you are at increased risk. You will have tests every 1-10 years, depending on your results and the type of screening test.  Diabetes screening. This is done by checking your blood sugar (glucose) after you have not eaten for a while (fasting). You may have this done every 1-3 years.  Sexually transmitted disease (STD) testing. Follow these instructions at home: Eating and drinking  Eat a diet that includes fresh fruits and vegetables, whole grains, lean protein, and low-fat dairy products.  Take vitamin and mineral supplements as recommended by your health care provider.  Do not drink alcohol if your health care provider tells you not to drink.  If you drink alcohol: ? Limit how much you have to 0-2 drinks a day. ? Be aware of how much alcohol is in your drink. In the [place] one drink equals one 12 oz bottle of beer (355 mL), one 5 oz glass of wine (148 mL), or one 1 oz glass of hard liquor (44 mL). Lifestyle  Take daily care of your teeth and gums.  Stay active. Exercise for at least 30 minutes on 5 or more days each week.  Do not use any products that contain nicotine or tobacco, such as cigarettes, e-cigarettes, and chewing tobacco. If you need help quitting, ask your health care provider.  If you are sexually active, practice safe sex. Use a condom or other form of protection to prevent STIs (sexually transmitted infections).  Talk with your health care provider about taking a low-dose aspirin every day starting at age 30. What's next?  Go to your health care provider once a year for a well check visit.  Ask your health care provider how often you should have your eyes and teeth checked.  Stay up to date on all vaccines. This information is not intended to replace advice given to you by your health care provider. Make sure you discuss any questions you have with your health care provider. Document Released: 10/22/2015  Document Revised: 09/19/2018 Document Reviewed: 09/19/2018 Elsevier Patient Education  2020 [employer].

## 2019-07-03 LAB — TSH: TSH: 0.464 u[IU]/mL (ref 0.450–4.500)

## 2019-07-03 LAB — CMP14+EGFR
ALT: 15 IU/L (ref 0–44)
AST: 20 IU/L (ref 0–40)
Albumin/Globulin Ratio: 1.8 (ref 1.2–2.2)
Albumin: 4.4 g/dL (ref 3.8–4.9)
Alkaline Phosphatase: 125 IU/L — ABNORMAL HIGH (ref 39–117)
BUN/Creatinine Ratio: 6 — ABNORMAL LOW (ref 9–20)
BUN: 6 mg/dL (ref 6–24)
Bilirubin Total: 0.3 mg/dL (ref 0.0–1.2)
CO2: 26 mmol/L (ref 20–29)
Calcium: 9.2 mg/dL (ref 8.7–10.2)
Chloride: 104 mmol/L (ref 96–106)
Creatinine, Ser: 0.94 mg/dL (ref 0.76–1.27)
GFR calc Af Amer: 108 mL/min/{1.73_m2} (ref >59)
GFR calc non Af Amer: 93 mL/min/{1.73_m2} (ref >59)
Globulin, Total: 2.4 g/dL (ref 1.5–4.5)
Glucose: 90 mg/dL (ref 65–99)
Potassium: 4.7 mmol/L (ref 3.5–5.2)
Sodium: 144 mmol/L (ref 134–144)
Total Protein: 6.8 g/dL (ref 6.0–8.5)

## 2019-07-03 LAB — LIPID PANEL
Chol/HDL Ratio: 2.9 ratio (ref 0.0–5.0)
Cholesterol, Total: 135 mg/dL (ref 100–199)
HDL: 47 mg/dL (ref >39)
LDL Chol Calc (NIH): 70 mg/dL (ref 0–99)
Triglycerides: 99 mg/dL (ref 0–149)
VLDL Cholesterol Cal: 18 mg/dL (ref 5–40)

## 2019-07-03 LAB — VITAMIN D 25 HYDROXY (VIT D DEFICIENCY, FRACTURES): Vit D, 25-Hydroxy: 33 ng/mL (ref 30.0–100.0)

## 2019-07-03 LAB — PSA: Prostate Specific Ag, Serum: 0.9 ng/mL (ref 0.0–4.0)

## 2019-07-07 ENCOUNTER — Telehealth: Payer: Self-pay | Admitting: Family Medicine

## 2019-07-07 NOTE — Telephone Encounter (Signed)
Aware of results. 

## 2019-07-08 ENCOUNTER — Encounter: Payer: Self-pay | Admitting: Gastroenterology

## 2019-08-11 ENCOUNTER — Other Ambulatory Visit: Payer: Self-pay

## 2019-08-11 DIAGNOSIS — Z20822 Contact with and (suspected) exposure to covid-19: Secondary | ICD-10-CM

## 2019-08-12 LAB — NOVEL CORONAVIRUS, NAA: SARS-CoV-2, NAA: NOT DETECTED

## 2019-08-19 ENCOUNTER — Encounter: Payer: Self-pay | Admitting: Gastroenterology

## 2019-08-19 ENCOUNTER — Telehealth: Payer: Self-pay | Admitting: Gastroenterology

## 2019-08-19 ENCOUNTER — Ambulatory Visit: Payer: BLUE CROSS/BLUE SHIELD

## 2019-08-19 NOTE — Telephone Encounter (Signed)
Noted  

## 2019-08-19 NOTE — Telephone Encounter (Signed)
Patient was a no show and letter sent  °

## 2020-02-11 ENCOUNTER — Emergency Department (HOSPITAL_COMMUNITY): Payer: BC Managed Care – PPO

## 2020-02-11 ENCOUNTER — Emergency Department (HOSPITAL_COMMUNITY)
Admission: EM | Admit: 2020-02-11 | Discharge: 2020-02-11 | Disposition: A | Discharge status: Home / Self Care | Payer: BC Managed Care – PPO | Attending: Emergency Medicine | Admitting: Emergency Medicine

## 2020-02-11 ENCOUNTER — Other Ambulatory Visit: Payer: Self-pay

## 2020-02-11 ENCOUNTER — Encounter (HOSPITAL_COMMUNITY): Payer: Self-pay

## 2020-02-11 DIAGNOSIS — Z96652 Presence of left artificial knee joint: Secondary | ICD-10-CM | POA: Insufficient documentation

## 2020-02-11 DIAGNOSIS — Z87891 Personal history of nicotine dependence: Secondary | ICD-10-CM | POA: Insufficient documentation

## 2020-02-11 DIAGNOSIS — Z79899 Other long term (current) drug therapy: Secondary | ICD-10-CM | POA: Insufficient documentation

## 2020-02-11 DIAGNOSIS — K5792 Diverticulitis of intestine, part unspecified, without perforation or abscess without bleeding: Secondary | ICD-10-CM | POA: Diagnosis not present

## 2020-02-11 DIAGNOSIS — M545 Low back pain, unspecified: Secondary | ICD-10-CM

## 2020-02-11 MED ORDER — METHOCARBAMOL 500 MG PO TABS: 500.0000 mg | ORAL_TABLET | Freq: Three times a day (TID) | ORAL | 0 refills | Status: DC | PRN

## 2020-02-11 MED ORDER — AMOXICILLIN-POT CLAVULANATE 875-125 MG PO TABS: 1.0000 | ORAL_TABLET | Freq: Two times a day (BID) | ORAL | 0 refills | Status: DC

## 2020-02-11 MED ORDER — HYDROCODONE-ACETAMINOPHEN 5-325 MG PO TABS: 1.0000 | ORAL_TABLET | Freq: Four times a day (QID) | ORAL | 0 refills | Status: DC | PRN

## 2020-02-11 NOTE — ED Provider Notes (Signed)
Jack [name]   CSN: [id number] Arrival date & time: 02/11/20  0701     History Chief Complaint  Patient presents with  . Back Pain    Jack [name] is a 54 y.o. male.  HPI Patient presents with low back pain. Began around 3 days ago. States the day before he had mowed his lawn on a riding mower but not really tolerating physical. States he feels if he had strained something however. States since he has been 13 now he has had issues with his back. Worse with movements. Better with sitting still. No fevers or chills. No loss of bladder or bowel control. Does states he has a little bit of diarrhea. No IV drug use. No cancer history. Does have a history of kidney stones but states he thinks this feels a little different. States he has had lithotripsy in the past. He has been taking over-the-counter medicines without much relief.    Past Medical History:  Diagnosis Date  . Anxiety   . Arthritis   . GERD (gastroesophageal reflux disease)   . Headache    occasional   . History of kidney stones   . Kidney stones [id number]    Patient Active Problem List   Diagnosis Date Noted  . 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 Vitamin D deficiency 04/15/2015    Past Surgical History:  Procedure Laterality Date  . EXTRACORPOREAL SHOCK WAVE LITHOTRIPSY Right 05/30/2018   Procedure: RIGHT EXTRACORPOREAL SHOCK WAVE LITHOTRIPSY (ESWL);  Surgeon: Ardis Hughs, MD;  Location: [facility];  Service: Urology;  Laterality: Right;  . KNEE SURGERY Left    3 arthroscopic,  two ACL reconstuctions  . SHOULDER SURGERY Right    dislocation repair with a pin  . TONSILLECTOMY AND ADENOIDECTOMY    . TOTAL KNEE ARTHROPLASTY Left 07/12/2015   Procedure: TOTAL LEFT  KNEE ARTHROPLASTY;  Surgeon: Gaynelle Arabian, MD;  Location: [facility];  Service: Orthopedics;  Laterality: Left;  . tubes in ears         Family History  Problem Relation Age of Onset  . Arthritis  Mother     Social History   Tobacco Use  . Smoking status: Never Smoker  . Smokeless tobacco: Former Systems developer    Types: Chew  Substance Use Topics  . Alcohol use: Not Currently    Alcohol/week: 0.0 standard drinks    Comment: occasional   . Drug use: No    Home Medications Prior to Admission medications   Medication Sig Start Date End Date Taking? Authorizing Provider  amoxicillin-clavulanate (AUGMENTIN) 875-125 MG tablet Take 1 tablet by mouth every 12 (twelve) hours. 02/11/20   Davonna Belling, MD  diclofenac sodium (VOLTAREN) 1 % GEL Apply 4 g topically 4 (four) times daily. (for knee pain) 07/02/19   Janora Norlander, DO  HYDROcodone-acetaminophen (NORCO/VICODIN) 5-325 MG tablet Take 1-2 tablets by mouth every 6 (six) hours as needed. 02/11/20   Davonna Belling, MD  methocarbamol (ROBAXIN) 500 MG tablet Take 1 tablet (500 mg total) by mouth every 8 (eight) hours as needed for muscle spasms. 02/11/20   Davonna Belling, MD    Allergies    Tramadol  Review of Systems   Review of Systems  Constitutional: Negative for appetite change, chills and fever.  HENT: Negative for congestion.   Respiratory: Negative for shortness of breath.   Gastrointestinal: Positive for diarrhea.  Genitourinary: Negative for difficulty urinating and flank pain.  Musculoskeletal: Positive for back  pain.  Skin: Negative for rash.  Neurological: Negative for weakness.  Psychiatric/Behavioral: Negative for confusion.    Physical Exam Updated Vital Signs BP (!) 128/95 (BP Location: Left Arm)   Pulse 77   Temp 97.8 F (36.6 C) (Oral)   Resp 18   Ht 5\' 8"  (1.727 m)   Wt 89.8 kg   SpO2 99%   BMI 30.11 kg/m   Physical Exam Vitals and nursing [name] reviewed.  Constitutional:      Comments: Laying on his back in bed. Sitting still.  HENT:     Head: Normocephalic.  Eyes:     Pupils: Pupils are equal, round, and reactive to light.  Cardiovascular:     Rate and Rhythm: Regular rhythm.   Pulmonary:     Breath sounds: No wheezing or rhonchi.  Abdominal:     Tenderness: There is abdominal tenderness.     Comments: Left lower quadrant tenderness without rebound or guarding.  Musculoskeletal:     Comments: Some left lower back tenderness. No rash. No deformity. No pain with straight leg raise but does have pain lowering leg on left side.  Skin:    Capillary Refill: Capillary refill takes less than 2 seconds.  Neurological:     Mental Status: He is alert and oriented to person, place, and time.     ED Results / Procedures / Treatments   Labs (all labs ordered are listed, but only abnormal results are displayed) Labs Reviewed - No data to display  EKG None  Radiology CT Renal Stone Study  Result Date: 02/11/2020 CLINICAL DATA:  Flank pain. EXAM: CT ABDOMEN AND PELVIS WITHOUT CONTRAST TECHNIQUE: Multidetector CT imaging of the abdomen and pelvis was performed following the standard protocol without IV contrast. COMPARISON:  05/24/2018 FINDINGS: Lower chest: No acute abnormality. Hepatobiliary: No focal liver abnormality is seen. No gallstones, gallbladder wall thickening, or biliary dilatation. Pancreas: Unremarkable. No pancreatic ductal dilatation or surrounding inflammatory changes. Spleen: Normal in size without focal abnormality. Adrenals/Urinary Tract: Normal appearance of the adrenal glands. There is a punctate stone within the upper pole collecting system of the right kidney measuring no more than 2 mm. No left kidney stones. No hydronephrosis or hydroureter. Bladder unremarkable. Stomach/Bowel: Stomach is within normal limits. Appendix appears normal. Diverticulosis is noted involving the left colon. There is mild pericolonic soft tissue stranding involving the sigmoid colon. Equivocal for uncomplicated diverticulitis or colitis. No complications identified. Vascular/Lymphatic: No significant vascular findings are present. No enlarged abdominal or pelvic lymph nodes.  Reproductive: Prostate is unremarkable. Other: No free fluid or fluid collections. Musculoskeletal: Mild multilevel lumbar degenerative disc disease. IMPRESSION: 1. Mild pericolonic soft tissue stranding involving the sigmoid colon. Equivocal for uncomplicated diverticulitis or colitis. 2. Nonobstructing punctate right renal calculus. Electronically Signed   By: 05/26/2018 M.D.   On: 02/11/2020 08:47    Procedures Procedures (including critical care time)  Medications Ordered in ED Medications - No data to display  ED Course  I have reviewed the triage vital signs and the nursing notes.  Pertinent labs & imaging results that were available during my care of the patient were reviewed by me and considered in my medical decision making (see chart for details).    MDM Rules/Calculators/A&P                      Patient with low back pain.  No rash.  No fevers.  Worse with movements.  Has had a history of back  pain.  I think the back pain is more likely musculoskeletal and will treat as such, however did have tenderness on the abdomen with palpation.  Patient was not aware of any abdominal tenderness before this.  Has slight diarrhea.  CT scan done due to the tenderness and history of kidney stones and showed potential mild diverticulitis.  Without clear GI symptoms and only with tenderness will give wait-and-see antibiotics.  Will attempt trial of a couple days without antibiotics and see how he does.  If continues to feel fine may not need the antibiotics.  Can follow-up PCP.  If develops pain fevers or worsening symptoms can take the antibiotics or return for more urgent evaluation. Final Clinical Impression(s) / ED Diagnoses Final diagnoses:  Acute bilateral low back pain without sciatica  Diverticulitis    Rx / DC Orders ED Discharge Orders         Ordered    methocarbamol (ROBAXIN) 500 MG tablet  Every 8 hours PRN     02/11/20 0907    HYDROcodone-acetaminophen (NORCO/VICODIN) 5-325  MG tablet  Every 6 hours PRN     02/11/20 0907    amoxicillin-clavulanate (AUGMENTIN) 875-125 MG tablet  Every 12 hours     02/11/20 0907           Benjiman Core, MD 02/11/20 [phone]

## 2020-02-11 NOTE — Discharge Instructions (Signed)
Fill the Augmentin if your abdominal tenderness is not improved in the next couple days.  If fevers increased pain develop fill the prescription sooner.  Follow-up with your primary care doctor for further evaluation of the back pain and the diverticulitis.

## 2020-02-11 NOTE — ED Triage Notes (Signed)
Pt c/o lower back pain since Sunday.  Reports has had intermittent back pain for "years."   Denies injury.

## 2021-01-23 IMAGING — CT CT RENAL STONE PROTOCOL
2 of 4 series · 16 of 46 positions shown, 18 images · non-contrast
Comparison: 05/24/2018

CLINICAL DATA: Flank pain.

EXAM:
CT ABDOMEN AND PELVIS WITHOUT CONTRAST
TECHNIQUE: Multidetector CT imaging of the abdomen and pelvis was performed
following the standard protocol without IV contrast.

[Series 2: axial st · axial · 0.83mm/px · z∈[+891,+1331]mm · 13 of 100 slices shown, 15 images]
[im 6/100  soft-tissue]
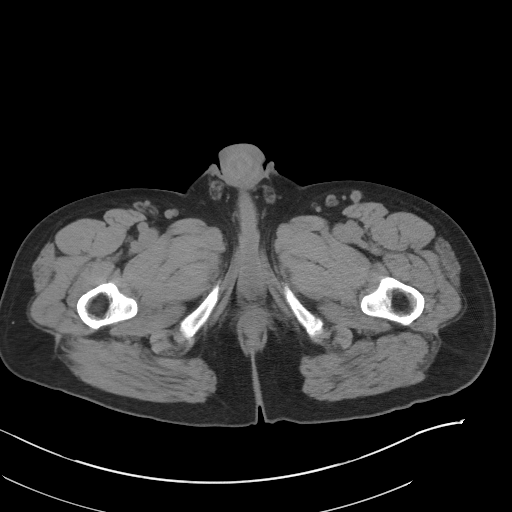
[im 6/100  bone]
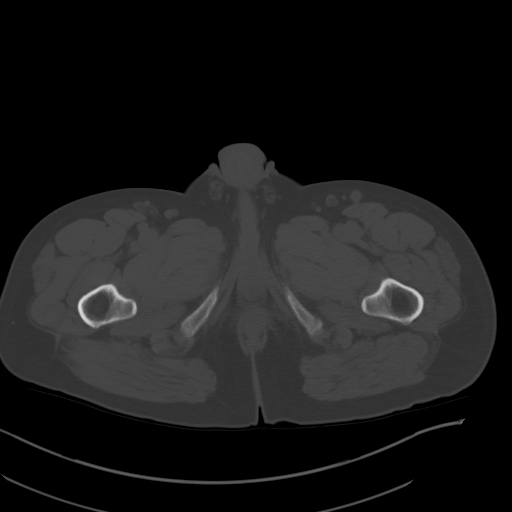
[im 12/100  soft-tissue]
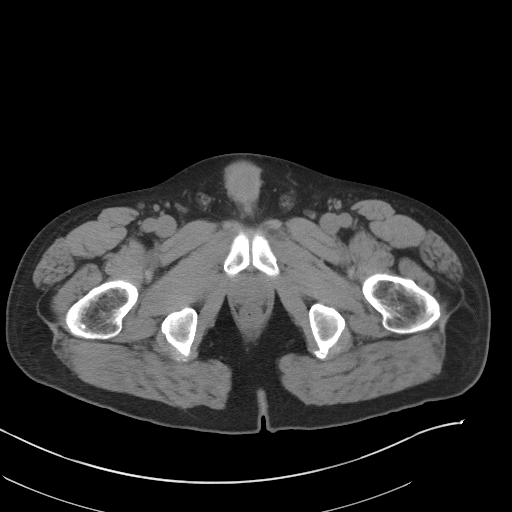
[im 24/100  soft-tissue]
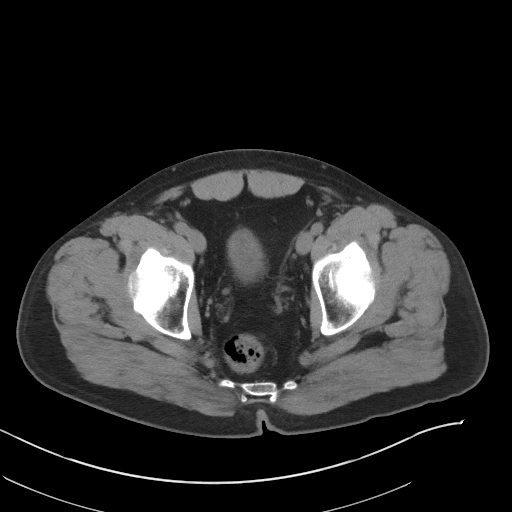
[im 30/100  soft-tissue]
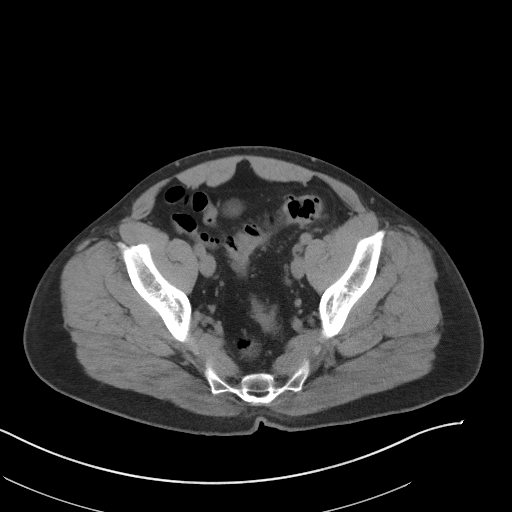
[im 35/100  soft-tissue]
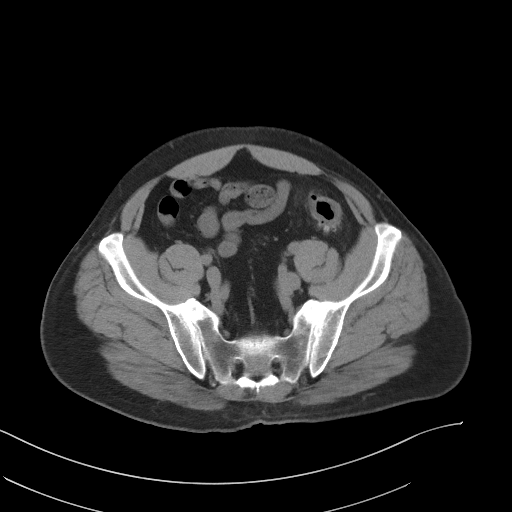
[im 41/100  soft-tissue]
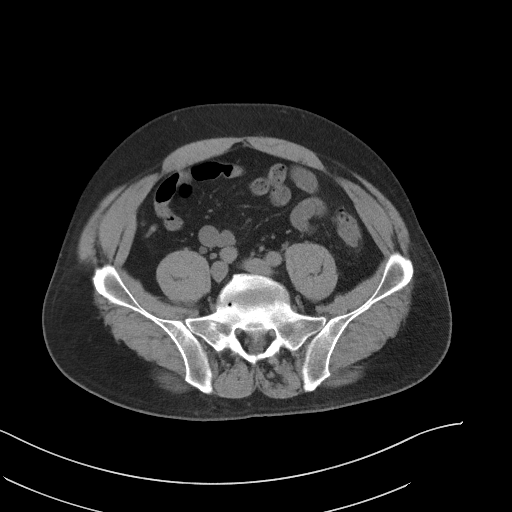
[im 53/100  soft-tissue]
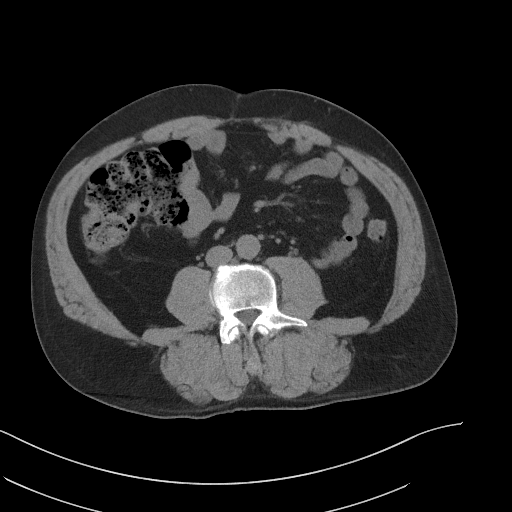
[im 59/100  soft-tissue]
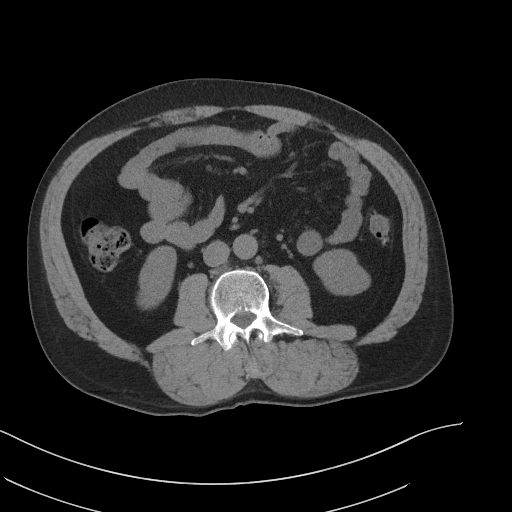
[im 65/100  soft-tissue]
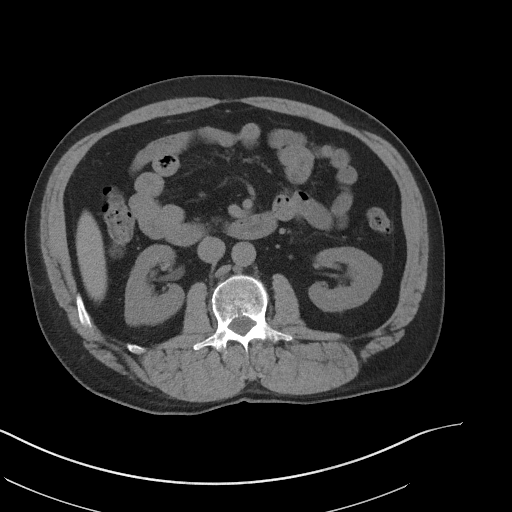
[im 65/100  bone]
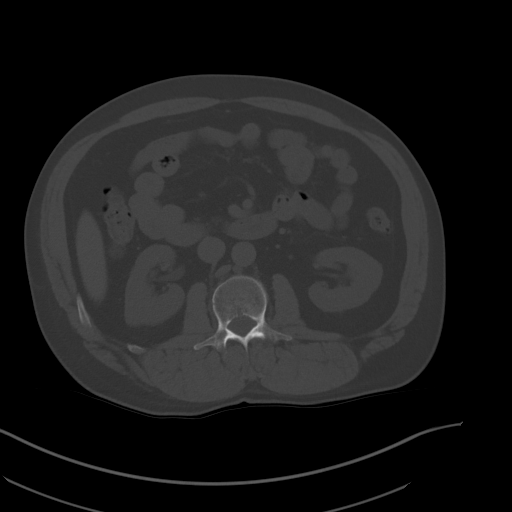
[im 70/100  soft-tissue]
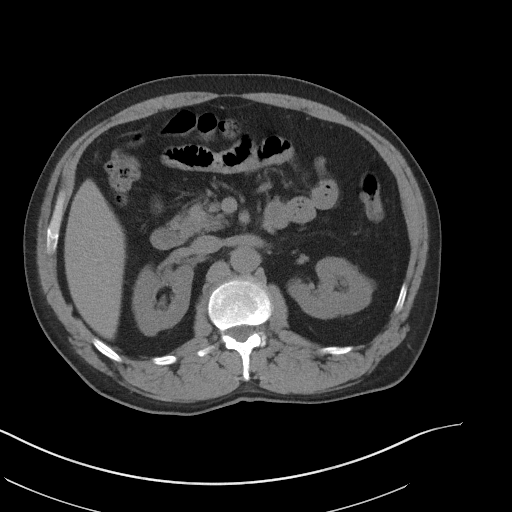
[im 76/100  soft-tissue]
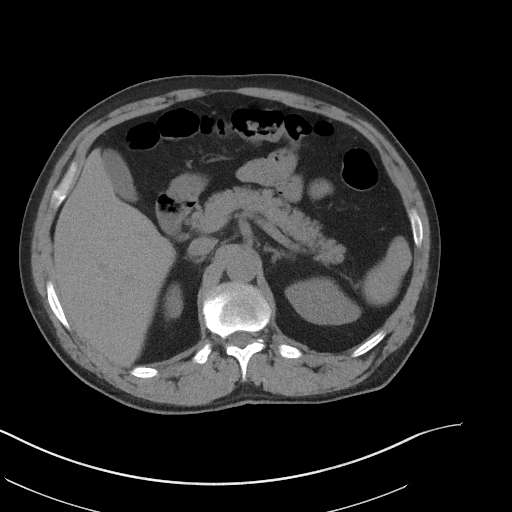
[im 88/100  soft-tissue]
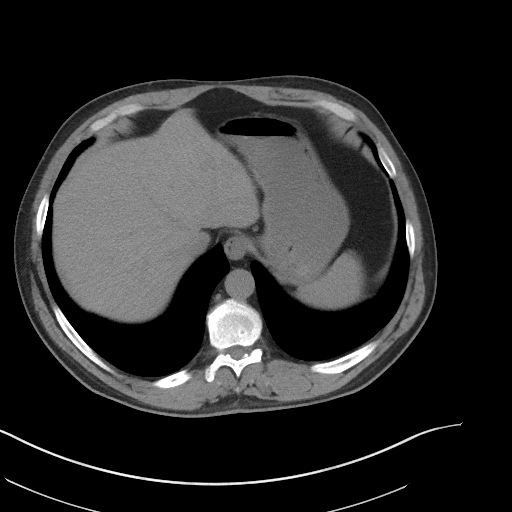
[im 94/100  soft-tissue]
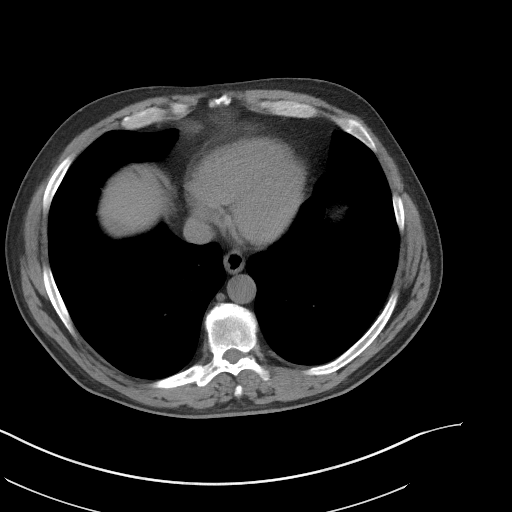

[Series 5: coronal st · coronal · 0.83mm/px · 3 of 101 slices shown]
[im 34/101  soft-tissue]
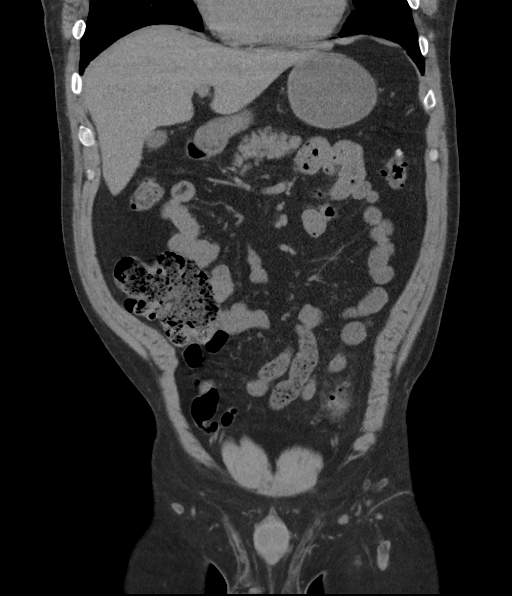
[im 45/101  soft-tissue]
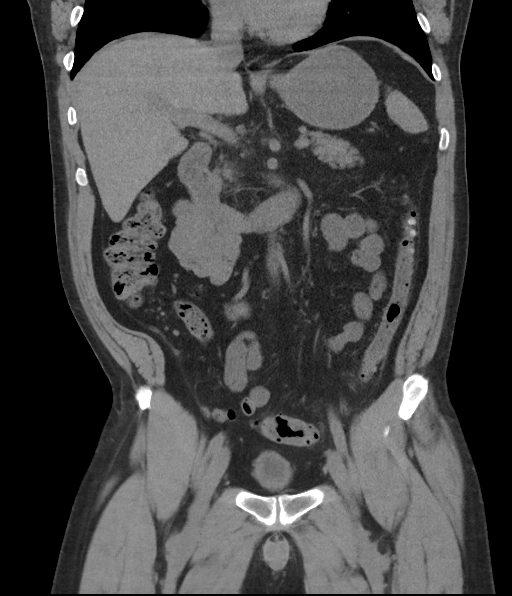
[im 56/101  soft-tissue]
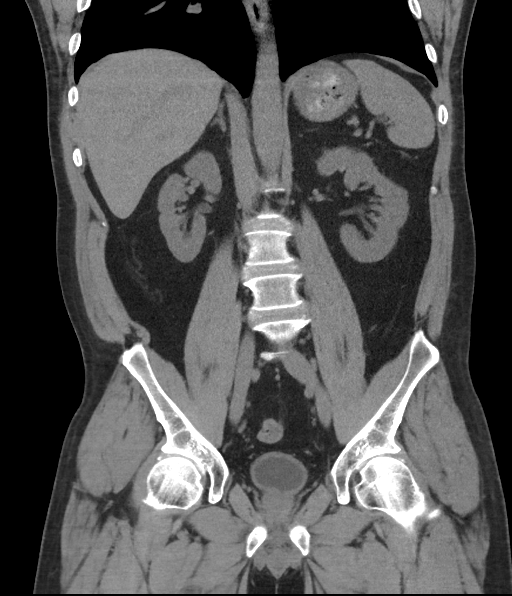

[16 of 46 positions shown; findings below may reference images not displayed]

FINDINGS: Lower chest: No acute abnormality.

Hepatobiliary: No focal liver abnormality is seen. No gallstones,
gallbladder wall thickening, or biliary dilatation.

Pancreas: Unremarkable. No pancreatic ductal dilatation or
surrounding inflammatory changes.

Spleen: Normal in size without focal abnormality.

Adrenals/Urinary Tract: Normal appearance of the adrenal glands.

There is a punctate stone within the upper pole collecting system of
the right kidney measuring no more than 2 mm. No left kidney stones.
No hydronephrosis or hydroureter. Bladder unremarkable.

Stomach/Bowel: Stomach is within normal limits. Appendix appears
normal. Diverticulosis is noted involving the left colon. There is
mild pericolonic soft tissue stranding involving the sigmoid colon.
Equivocal for uncomplicated diverticulitis or colitis. No
complications identified.

Vascular/Lymphatic: No significant vascular findings are present. No
enlarged abdominal or pelvic lymph nodes.

Reproductive: Prostate is unremarkable.

Other: No free fluid or fluid collections.

Musculoskeletal: Mild multilevel lumbar degenerative disc disease.
IMPRESSION: 1. Mild pericolonic soft tissue stranding involving the sigmoid
colon. Equivocal for uncomplicated diverticulitis or colitis.
2. Nonobstructing punctate right renal calculus.

## 2021-04-25 ENCOUNTER — Encounter: Payer: Self-pay | Admitting: Family

## 2021-04-25 ENCOUNTER — Other Ambulatory Visit: Payer: Self-pay | Admitting: Family

## 2021-04-25 ENCOUNTER — Ambulatory Visit (INDEPENDENT_AMBULATORY_CARE_PROVIDER_SITE_OTHER): Payer: BC Managed Care – PPO | Admitting: Family

## 2021-04-25 DIAGNOSIS — U071 COVID-19: Secondary | ICD-10-CM

## 2021-04-25 MED ORDER — ALBUTEROL SULFATE HFA 108 (90 BASE) MCG/ACT IN AERS: 2.0000 | INHALATION_SPRAY | Freq: Four times a day (QID) | RESPIRATORY_TRACT | 0 refills | Status: DC | PRN

## 2021-04-25 MED ORDER — MOLNUPIRAVIR EUA 200MG CAPSULE: 4.0000 | ORAL_CAPSULE | Freq: Two times a day (BID) | ORAL | 0 refills | Status: AC

## 2021-04-25 NOTE — Progress Notes (Signed)
Virtual Visit  Note Due to COVID-19 pandemic this visit was conducted virtually. This visit type was conducted due to national recommendations for restrictions regarding the COVID-19 Pandemic (e.g. social distancing, sheltering in place) in an effort to limit this patient's exposure and mitigate transmission in our community. All issues noted in this document were discussed and addressed.  A physical exam was not performed with this format.  I connected with Jack [name] on 04/25/21 at 1:56 pm  by telephone and verified that I am speaking with the correct person using two identifiers. Jack [name] is currently located at home and no one is currently with him during visit. The provider, Jannifer Rodney, FNP is located in their office at time of visit.  I discussed the limitations, risks, security and privacy concerns of performing an evaluation and management service by telephone and the availability of in person appointments. I also discussed with the patient that there may be a patient responsible charge related to this service. The patient expressed understanding and agreed to proceed.  [name], Jack [name] are scheduled for a virtual visit with your provider today.    Just as we do with appointments in the office, we must obtain your consent to participate.  Your consent will be active for this visit and any virtual visit you may have with one of our providers in the next 365 days.    If you have a MyChart account, I can also send a copy of this consent to you electronically.  All virtual visits are billed to your insurance company just like a traditional visit in the office.  As this is a virtual visit, video technology does not allow for your provider to perform a traditional examination.  This may limit your provider's ability to fully assess your condition.  If your provider identifies any concerns that need to be evaluated in person or the need to arrange testing such as labs, EKG, etc, we will make  arrangements to do so.    Although advances in technology are sophisticated, we cannot ensure that it will always work on either your end or our end.  If the connection with a video visit is poor, we may have to switch to a telephone visit.  With either a video or telephone visit, we are not always able to ensure that we have a secure connection.   I need to obtain your verbal consent now.   Are you willing to proceed with your visit today?   Jack [name] has provided verbal consent on 04/25/2021 for a virtual visit (video or telephone).   Jannifer Rodney, [place] 04/25/2021  1:58 PM    History and Present Illness:  Pt calls the office today with COVID. He reports he started having symptoms on 04/21/21. Cough This is a new problem. The current episode started in the past 7 days. The problem has been gradually worsening. The problem occurs every few minutes. The cough is Non-productive. Associated symptoms include chills, headaches, myalgias, nasal congestion, postnasal drip and shortness of breath. Pertinent negatives include no ear congestion, ear pain, fever or wheezing. Associated symptoms comments: No taste or smell. The symptoms are aggravated by lying down.     Review of Systems  Constitutional:  Positive for chills. Negative for fever.  HENT:  Positive for postnasal drip. Negative for ear pain.   Respiratory:  Positive for cough and shortness of breath. Negative for wheezing.   Musculoskeletal:  Positive for myalgias.  Neurological:  Positive for  headaches.    Observations/Objective: No SOB or distress noted, hoarse voice   Assessment and Plan: 1. COVID-19 virus detected COVID positive, rest, force fluids, tylenol as needed, Quarantine for at least 5 days and fever free, report any worsening symptoms such as increased shortness of breath, swelling, or continued high fevers.  Possible adverse effects discused   - MyChart COVID-19 home monitoring program; Future - molnupiravir EUA  200 mg CAPS; Take 4 capsules (800 mg total) by mouth 2 (two) times daily for 5 days.  Dispense: 40 capsule; Refill: 0 - albuterol (VENTOLIN HFA) 108 (90 Base) MCG/ACT inhaler; Inhale 2 puffs into the lungs every 6 (six) hours as needed for wheezing or shortness of breath.  Dispense: 8 g; Refill: 0     I discussed the assessment and treatment plan with the patient. The patient was provided an opportunity to ask questions and all were answered. The patient agreed with the plan and demonstrated an understanding of the instructions.   The patient was advised to call back or seek an in-person evaluation if the symptoms worsen or if the condition fails to improve as anticipated.  The above assessment and management plan was discussed with the patient. The patient verbalized understanding of and has agreed to the management plan. Patient is aware to call the clinic if symptoms persist or worsen. Patient is aware when to return to the clinic for a follow-up visit. Patient educated on when it is appropriate to go to the emergency department.   Time call ended:  2:08 pm   I provided 12 minutes of  non face-to-face time during this encounter.    Jannifer Rodney, FNP

## 2021-05-24 DIAGNOSIS — L570 Actinic keratosis: Secondary | ICD-10-CM | POA: Diagnosis not present

## 2021-05-24 DIAGNOSIS — L905 Scar conditions and fibrosis of skin: Secondary | ICD-10-CM | POA: Diagnosis not present

## 2021-05-24 DIAGNOSIS — X32XXXD Exposure to sunlight, subsequent encounter: Secondary | ICD-10-CM | POA: Diagnosis not present

## 2021-05-24 DIAGNOSIS — L910 Hypertrophic scar: Secondary | ICD-10-CM | POA: Diagnosis not present

## 2022-06-21 ENCOUNTER — Ambulatory Visit: Payer: Self-pay | Admitting: Family Medicine

## 2022-06-21 ENCOUNTER — Encounter: Payer: Self-pay | Admitting: Family Medicine

## 2022-06-21 VITALS — BP 125/83 | HR 67 | Temp 97.2°F | Ht 68.0 in | Wt 196.0 lb

## 2022-06-21 DIAGNOSIS — G8929 Other chronic pain: Secondary | ICD-10-CM

## 2022-06-21 DIAGNOSIS — M5441 Lumbago with sciatica, right side: Secondary | ICD-10-CM

## 2022-06-21 MED ORDER — GABAPENTIN 600 MG PO TABS: 300.0000 mg | ORAL_TABLET | Freq: Two times a day (BID) | ORAL | 1 refills | Status: DC | PRN

## 2022-06-21 MED ORDER — METHOCARBAMOL 500 MG PO TABS: 500.0000 mg | ORAL_TABLET | Freq: Two times a day (BID) | ORAL | 0 refills | Status: DC | PRN

## 2022-06-21 MED ORDER — METHYLPREDNISOLONE ACETATE 80 MG/ML IJ SUSP
80.0000 mg | Freq: Once | INTRAMUSCULAR | Status: AC
  Administered 2022-06-21: 80 mg via INTRAMUSCULAR

## 2022-06-21 MED ORDER — PREDNISONE 10 MG (21) PO TBPK: ORAL_TABLET | ORAL | 0 refills | Status: DC

## 2022-06-21 NOTE — Progress Notes (Signed)
Subjective: CC: Back pain PCP: Jack Ip, DO [id number] T Jack [name] is a 56 y.o. male presenting to clinic today for:  1.  Back pain with sciatica Patient reports that he has always had some issues with his low back and sciatica but it seems to be getting worse over the last several weeks.  He has not had any recent back injuries but notes that when he was 56 years old he did sustain a back injury.  He notes pain radiates down the right buttock and into the lower leg.  The pain is quite excruciating at times.  He works on concrete floors all day with frequent bending.  He notes that these activities can exacerbate symptoms but standing is really the most bothersome.  He does not report any weakness, saddle anesthesia or fecal incontinence.  No urinary retention.  OTC meds are not helpful at this time.   ROS: Per HPI  Allergies  Allergen Reactions   Tramadol Nausea And Vomiting   Past Medical History:  Diagnosis Date   Anxiety    Arthritis    GERD (gastroesophageal reflux disease)    Headache    occasional    History of kidney stones    Kidney stones [id number]    Current Outpatient Medications:    albuterol (VENTOLIN HFA) 108 (90 Base) MCG/ACT inhaler, Inhale 2 puffs into the lungs every 6 (six) hours as needed for wheezing or shortness of breath., Disp: 8 g, Rfl: 0   diclofenac sodium (VOLTAREN) 1 % GEL, Apply 4 g topically 4 (four) times daily. (for knee pain) (Patient not taking: Reported on 06/21/2022), Disp: 400 g, Rfl: 3   HYDROcodone-acetaminophen (NORCO/VICODIN) 5-325 MG tablet, Take 1-2 tablets by mouth every 6 (six) hours as needed. (Patient not taking: Reported on 06/21/2022), Disp: 6 tablet, Rfl: 0   methocarbamol (ROBAXIN) 500 MG tablet, Take 1 tablet (500 mg total) by mouth every 8 (eight) hours as needed for muscle spasms. (Patient not taking: Reported on 06/21/2022), Disp: 8 tablet, Rfl: 0 Social History   Socioeconomic History   Marital status: Single     Spouse name: Not on file   Number of children: Not on file   Years of education: Not on file   Highest education level: Not on file  Occupational History   Not on file  Tobacco Use   Smoking status: Never   Smokeless tobacco: Former    Types: Associate Professor Use: Never used  Substance and Sexual Activity   Alcohol use: Not Currently    Alcohol/week: 0.0 standard drinks of alcohol    Comment: occasional    Drug use: No   Sexual activity: Not on file  Other Topics Concern   Not on file  Social History Narrative   Not on file   Social Determinants of Health   Financial Resource Strain: Not on file  Food Insecurity: Not on file  Transportation Needs: Not on file  Physical Activity: Not on file  Stress: Not on file  Social Connections: Not on file  Intimate Partner Violence: Not on file   Family History  Problem Relation Age of Onset   Arthritis Mother     Objective: Office vital signs reviewed. BP 125/83   Pulse 67   Temp (!) 97.2 F (36.2 C) (Temporal)   Ht 5\' 8"  (1.727 m)   Wt 196 lb (88.9 kg)   SpO2 100%   BMI 29.80 kg/m   Physical Examination:  General:  Awake, alert, uncomfortable appearing MSK: Antalgic gait and station  Lumbar spine: Active range of motion is limited secondary to pain.  He has no midline tenderness palpation but does have pain over the right lumbosacral junction.  No palpable bony abnormalities.  He does have a small cyst palpable in the area of pain as well.  Positive straight leg raise on right Neuro: 5/5 LE Strength and light touch sensation grossly intact   Assessment/ Plan: 56 y.o. male   Chronic right-sided low back pain with right-sided sciatica - Plan: methylPREDNISolone acetate (DEPO-MEDROL) injection 80 mg, predniSONE (STERAPRED UNI-PAK 21 TAB) 10 MG (21) TBPK tablet, methocarbamol (ROBAXIN) 500 MG tablet, gabapentin (NEURONTIN) 600 MG tablet  Suspect that he has a bulging disc as he has a positive straight leg raise  on the right.  Given uninsured status I am going to treat symptomatically and see if we can get him improved.  I given him home physical therapy exercises to do and I will plan to see him back in 4 weeks.  If no significant improvement then I will plan for plain films and referral to orthopedics for further evaluation and management.  No orders of the defined types were placed in this encounter.  No orders of the defined types were placed in this encounter.    Jack Ip, DO [facility] [phone]

## 2022-07-28 ENCOUNTER — Ambulatory Visit: Payer: BC Managed Care – PPO | Admitting: Family Medicine

## 2022-10-26 DIAGNOSIS — Z96652 Presence of left artificial knee joint: Secondary | ICD-10-CM | POA: Diagnosis not present

## 2022-11-30 DIAGNOSIS — Z5189 Encounter for other specified aftercare: Secondary | ICD-10-CM | POA: Diagnosis not present

## 2022-11-30 DIAGNOSIS — M25462 Effusion, left knee: Secondary | ICD-10-CM | POA: Diagnosis not present

## 2022-12-11 ENCOUNTER — Ambulatory Visit: Payer: BC Managed Care – PPO | Attending: Orthopedic Surgery

## 2022-12-15 ENCOUNTER — Ambulatory Visit (INDEPENDENT_AMBULATORY_CARE_PROVIDER_SITE_OTHER): Payer: Medicaid Other | Admitting: Family Medicine

## 2022-12-15 ENCOUNTER — Encounter: Payer: Self-pay | Admitting: Family Medicine

## 2022-12-15 VITALS — BP 153/91 | HR 97 | Temp 98.6°F | Ht 68.0 in | Wt 195.0 lb

## 2022-12-15 DIAGNOSIS — G8929 Other chronic pain: Secondary | ICD-10-CM | POA: Diagnosis not present

## 2022-12-15 DIAGNOSIS — M5441 Lumbago with sciatica, right side: Secondary | ICD-10-CM

## 2022-12-15 DIAGNOSIS — F419 Anxiety disorder, unspecified: Secondary | ICD-10-CM

## 2022-12-15 DIAGNOSIS — F430 Acute stress reaction: Secondary | ICD-10-CM

## 2022-12-15 MED ORDER — DULOXETINE HCL 30 MG PO CPEP: ORAL_CAPSULE | ORAL | 0 refills | Status: DC

## 2022-12-15 MED ORDER — METHOCARBAMOL 500 MG PO TABS: 500.0000 mg | ORAL_TABLET | Freq: Two times a day (BID) | ORAL | 0 refills | Status: DC | PRN

## 2022-12-15 NOTE — Patient Instructions (Signed)
Taking the medicine as directed and not missing any doses is one of the best things you can do to treat your depression.  Here are some things to keep in mind:  Side effects (stomach upset, some increased anxiety) may happen before you notice a benefit.  These side effects typically go away over time. Changes to your dose of medicine or a change in medication all together is sometimes necessary Most people need to be on medication at least 12 months Many people will notice an improvement within two weeks but the full effect of the medication can take up to 4-6 weeks Stopping the medication when you start feeling better often results in a return of symptoms Never discontinue your medication without contacting a health care professional first.  Some medications require gradual discontinuation/ taper and can make you sick if you stop them abruptly.  If your symptoms worsen or you have thoughts of suicide/homicide, PLEASE SEEK IMMEDIATE MEDICAL ATTENTION.  You may always call:  National Suicide Hotline: 800-273-8255 [place] Crisis Line: 336-832-9700 Crisis Recovery in [place]: 800-939-5911   These are available 24 hours a day, 7 days a week.  

## 2022-12-15 NOTE — Progress Notes (Signed)
Subjective: CC: Follow-up back pain PCP: Janora Norlander, DO [id number] Jack [name] is a 57 y.o. male presenting to clinic today for:  1.  Back pain Patient was seen back in September for chronic right-sided back pain with right-sided sciatica.  He was given a muscle relaxant, steroid Dosepak and gabapentin and advised to follow-up in 4 to 6 weeks.  He unfortunately canceled the appointment because he lost insurance.  He presents today and notes that he continues to struggle with orthopedic issues including his back/ left knee.  He is pursuing disability now.  He never did start the gabapentin because he was concerned about the side effects that he read.  He did use some of the muscle relaxer and found it to be helpful.  2.  Anxiety He is very tearful today because he has not been able to hold a job due to multiple orthopedic issues.  He feels like he has relatives that would support him if needed financial help but he reports that he is to proud to ask for things like that.  He recently went to the social services department and was able to secure some food stamps so he is not going hungry and does not need additional resources for food pantry's today but he does worry about other bills adding up and not being able to keep up with these things, ultimately affecting his credit.   ROS: Per HPI  Allergies  Allergen Reactions   Tramadol Nausea And Vomiting   Past Medical History:  Diagnosis Date   Anxiety    Arthritis    GERD (gastroesophageal reflux disease)    Headache    occasional    History of kidney stones    Kidney stones [id number]    Current Outpatient Medications:    albuterol (VENTOLIN HFA) 108 (90 Base) MCG/ACT inhaler, Inhale 2 puffs into the lungs every 6 (six) hours as needed for wheezing or shortness of breath., Disp: 8 g, Rfl: 0   diclofenac sodium (VOLTAREN) 1 % GEL, Apply 4 g topically 4 (four) times daily. (for knee pain) (Patient not taking: Reported on 06/21/2022),  Disp: 400 g, Rfl: 3   gabapentin (NEURONTIN) 600 MG tablet, Take 0.5-1 tablets (300-600 mg total) by mouth 2 (two) times daily as needed., Disp: 60 tablet, Rfl: 1   HYDROcodone-acetaminophen (NORCO/VICODIN) 5-325 MG tablet, Take 1-2 tablets by mouth every 6 (six) hours as needed. (Patient not taking: Reported on 06/21/2022), Disp: 6 tablet, Rfl: 0   methocarbamol (ROBAXIN) 500 MG tablet, Take 1 tablet (500 mg total) by mouth 2 (two) times daily as needed for muscle spasms., Disp: 30 tablet, Rfl: 0   predniSONE (STERAPRED UNI-PAK 21 TAB) 10 MG (21) TBPK tablet, As directed x 6 days, Disp: 21 tablet, Rfl: 0 Social History   Socioeconomic History   Marital status: Single    Spouse name: Not on file   Number of children: Not on file   Years of education: Not on file   Highest education level: Not on file  Occupational History   Not on file  Tobacco Use   Smoking status: Never   Smokeless tobacco: Former    Types: Nurse, children's Use: Never used  Substance and Sexual Activity   Alcohol use: Not Currently    Alcohol/week: 0.0 standard drinks of alcohol    Comment: occasional    Drug use: No   Sexual activity: Not on file  Other Topics Concern   Not on  file  Social History Narrative   Not on file   Social Determinants of Health   Financial Resource Strain: Not on file  Food Insecurity: Not on file  Transportation Needs: Not on file  Physical Activity: Not on file  Stress: Not on file  Social Connections: Not on file  Intimate Partner Violence: Not on file   Family History  Problem Relation Age of Onset   Arthritis Mother     Objective: Office vital signs reviewed. BP (!) 153/91   Pulse 97   Temp 98.6 F (37 C)   Ht '5\' 8"'$  (1.727 m)   Wt 195 lb (88.5 kg)   SpO2 97%   BMI 29.65 kg/m   Physical Examination:  General: Awake, alert, well nourished, anxious HEENT: sclera injected. tearful Cardio: regular rate and rhythm Pulm: no wheezing. normal work of  breathing on room air MSK: gait antalgic but ambulating independently. Not able to sit comfortably Psych: tearful, sad, anxious.      12/15/2022    2:27 PM 07/02/2019    8:53 AM 05/24/2018   11:59 AM  Depression screen PHQ 2/9  Decreased Interest 1 1 0  Down, Depressed, Hopeless 2 1 0  PHQ - 2 Score 3 2 0  Altered sleeping 1 1   Tired, decreased energy 1 1   Change in appetite 1    Feeling bad or failure about yourself  2 0   Trouble concentrating 1 1   Moving slowly or fidgety/restless 1 0   Suicidal thoughts 0    PHQ-9 Score 10 5   Difficult doing work/chores Somewhat difficult        12/15/2022    2:25 PM 07/02/2019    8:54 AM  GAD 7 : Generalized Anxiety Score  Nervous, Anxious, on Edge 2 1  Control/stop worrying 3 1  Worry too much - different things 3 1  Trouble relaxing 2 1  Restless 2 1  Easily annoyed or irritable 2 3  Afraid - awful might happen 1 0  Total GAD 7 Score 15 8  Anxiety Difficulty  Somewhat difficult    Assessment/ Plan: 57 y.o. male   Chronic right-sided low back pain with right-sided sciatica - Plan: methocarbamol (ROBAXIN) 500 MG tablet, DULoxetine (CYMBALTA) 30 MG capsule  Reaction, stress, acute - Plan: DULoxetine (CYMBALTA) 30 MG capsule  Anxiety - Plan: DULoxetine (CYMBALTA) 30 MG capsule  Renew Robaxin.  I am going to start him on Cymbalta in efforts to help with both mental health and physical health.  We will reassess in the next 6 to 8 weeks.  He is welcome to message me on MyChart if this medication is effective and I will change the dose to 60 mg.  He will start with 30 mg and may titrate up to 60 mg in the next few weeks if needed.  He will contact me if any additional changes are needed and we can try and communicate via MyChart  He is having quite a bit of financial stress and I have given him some resources.  He has the [facility] health patient assistance financial forms to complete to at least offset some of the medical costs while he is  trying to work things out.  No orders of the defined types were placed in this encounter.  No orders of the defined types were placed in this encounter.    Janora Norlander, DO [facility] [phone]

## 2023-01-16 ENCOUNTER — Ambulatory Visit: Payer: Medicaid Other | Attending: Orthopedic Surgery

## 2023-01-16 DIAGNOSIS — M25562 Pain in left knee: Secondary | ICD-10-CM | POA: Insufficient documentation

## 2023-01-16 DIAGNOSIS — G8929 Other chronic pain: Secondary | ICD-10-CM | POA: Insufficient documentation

## 2023-01-16 NOTE — Therapy (Signed)
OUTPATIENT PHYSICAL THERAPY LOWER EXTREMITY EVALUATION   Patient Name: Jack [name] MRN: [id number] DOB:[birth year], 57 y.o., male Today's Date: 01/16/2023  END OF SESSION:  PT End of Session - 01/16/23 1350     Visit Number 1    Number of Visits 6    Date for PT Re-Evaluation 02/16/23    PT Start Time 1353    PT Stop Time 1420    PT Time Calculation (min) 27 min    Activity Tolerance Patient tolerated treatment well    Behavior During Therapy [facility] for tasks assessed/performed             Past Medical History:  Diagnosis Date   Anxiety    Arthritis    GERD (gastroesophageal reflux disease)    Headache    occasional    History of kidney stones    Kidney stones [id number]   Past Surgical History:  Procedure Laterality Date   EXTRACORPOREAL SHOCK WAVE LITHOTRIPSY Right 05/30/2018   Procedure: RIGHT EXTRACORPOREAL SHOCK WAVE LITHOTRIPSY (ESWL);  Surgeon: Crist Fat, MD;  Location: [facility];  Service: Urology;  Laterality: Right;   KNEE SURGERY Left    3 arthroscopic,  two ACL reconstuctions   SHOULDER SURGERY Right    dislocation repair with a pin   TONSILLECTOMY AND ADENOIDECTOMY     TOTAL KNEE ARTHROPLASTY Left 07/12/2015   Procedure: TOTAL LEFT  KNEE ARTHROPLASTY;  Surgeon: Ollen Gross, MD;  Location: [facility];  Service: Orthopedics;  Laterality: Left;   tubes in ears     Patient Active Problem List   Diagnosis Date Noted   History of total left knee replacement 10/22/2015   Vitamin D deficiency 04/15/2015    PCP: Raliegh Ip, DO  REFERRING PROVIDER: Ollen Gross, MD   REFERRING DIAG: Encounter for other specified aftercare   THERAPY DIAG:  Chronic pain of left knee  Rationale for Evaluation and Treatment: Rehabilitation  ONSET DATE: 2016  SUBJECTIVE:   SUBJECTIVE STATEMENT: Patient reports that he had his left knee replaced over 7.5 years ago. He notes that he is still having pain in his knee. For example, yesterday was really bad and  he could hardly walk. He had a x-ray and he was told that his knee looked good. He has had multiple surgeries on his left knee. He has not noticed any change in his symptoms in recent months. He feels that his knee is going to give out. He has lost his balance multiple times causing him to have to catch himself. He also also has chronic low back pain which causes pain and numbness in his right leg.   PERTINENT HISTORY: Anxiety, arthritis, chronic low back pain, and history of multiple knee surgeries PAIN:  Are you having pain? Yes: NPRS scale: 8/10 Pain location: left knee Pain description: constant, sharp, and aching Aggravating factors: walking (10-15 minutes at most), standing (10-15 minutes), stairs, and walking up hills Relieving factors: ice and elevation   PRECAUTIONS: Fall  WEIGHT BEARING RESTRICTIONS: No  FALLS:  Has patient fallen in last 6 months? Yes. Number of falls 3-4; tripped over a rug in his house once and also got tangled up when cutting wood with most recent fall occurring in February 2024   LIVING ENVIRONMENT: Lives with: lives alone Lives in: House/apartment Stairs: Yes: External: 3 steps; none; step to pattern Has following equipment at home: None  OCCUPATION: not working  PLOF: Independent  PATIENT GOALS: reduced pain, improved strength and balance  NEXT MD  VISIT: 01/18/23  OBJECTIVE:   COGNITION: Overall cognitive status: Within functional limits for tasks assessed     SENSATION: Patient reports no numbness or tingling currently  EDEMA:  No edema observed  POSTURE: No Significant postural limitations  PALPATION: TTP: left distal hip adductors and medial joint line  LOWER EXTREMITY ROM:  Active ROM Right eval Left eval  Hip flexion    Hip extension    Hip abduction    Hip adduction    Hip internal rotation    Hip external rotation    Knee flexion 134 117; limited by pain and stiffness  Knee extension 7 degrees of hyperextension 7  degrees of hyperextension   Ankle dorsiflexion    Ankle plantarflexion    Ankle inversion    Ankle eversion     (Blank rows = not tested)  LOWER EXTREMITY MMT:  MMT Right eval Left eval  Hip flexion 4+/5 4/5  Hip extension    Hip abduction    Hip adduction    Hip internal rotation    Hip external rotation    Knee flexion 5/5 4+/5  Knee extension 5/5 4/5; painful   Ankle dorsiflexion    Ankle plantarflexion    Ankle inversion    Ankle eversion     (Blank rows = not tested)  FUNCTIONAL TESTS:  5 times sit to stand: 19.54 seconds w/o UE support (patient reported bilateral knee pain)   GAIT: Assistive device utilized: None Level of assistance: Complete Independence  TODAY'S TREATMENT:                                                                                                                              DATE:     PATIENT EDUCATION:  Education details: Plan of care, prognosis, healing, and goals for therapy Person educated: Patient Education method: Explanation Education comprehension: verbalized understanding  HOME EXERCISE PROGRAM:   ASSESSMENT:  CLINICAL IMPRESSION: Patient is a 57 y.o. male who was seen today for physical therapy evaluation and treatment for chronic left knee pain secondary to a left total knee replacement in 2016. He presented with moderate to high pain severity and irritability with left knee extension manual muscle testing being the most aggravating to his familiar symptoms. He also exhibited knee hyperextension bilaterally and reduced left knee flexion compared to the right. He is also a high fall risk as evidenced by his objective measures and history of falling. Recommend that he continue with skilled physical therapy to address his impairments to maximize his functional mobility.  OBJECTIVE IMPAIRMENTS: decreased activity tolerance, decreased balance, decreased mobility, difficulty walking, decreased ROM, decreased strength, impaired tone,  and pain.   ACTIVITY LIMITATIONS: lifting, standing, stairs, transfers, and locomotion level  PARTICIPATION LIMITATIONS: meal prep, cleaning, laundry, shopping, community activity, and yard work  PERSONAL FACTORS: Past/current experiences, Time since onset of injury/illness/exacerbation, and 3+ comorbidities: Anxiety, arthritis, chronic low back pain, and history of multiple knee surgeries  are also  affecting patient's functional outcome.   REHAB POTENTIAL: Fair    CLINICAL DECISION MAKING: Evolving/moderate complexity  EVALUATION COMPLEXITY: Moderate   GOALS: Goals reviewed with patient? Yes  LONG TERM GOALS: Target date: 02/06/23  Patient will be independent with his HEP. Baseline:  Goal status: INITIAL  2.  Patient will be able to complete his daily activities without his familiar pain exceeding 6/10. Baseline:  Goal status: INITIAL  3.  Patient will improve his 5 times sit to stand to 12 seconds or less for improved safety and lower extremity power. Baseline:  Goal status: INITIAL  4.  Patient will be able to walk for at least 25 minutes without being limited by his familiar left knee pain. Baseline:  Goal status: INITIAL  5.  Patient will be able to navigate at least 3 steps with a reciprocal pattern for improved household independence. Baseline:  Goal status: INITIAL  PLAN:  PT FREQUENCY: 2x/week  PT DURATION: 3 weeks  PLANNED INTERVENTIONS: Therapeutic exercises, Therapeutic activity, Neuromuscular re-education, Balance training, Patient/Family education, Self Care, Joint mobilization, Stair training, Electrical stimulation, Cryotherapy, Moist heat, Vasopneumatic device, Manual therapy, and Re-evaluation  PLAN FOR NEXT SESSION: Quadriceps and hamstring strengthening   Granville Lewis, PT 01/16/2023, 6:29 PM

## 2023-01-24 ENCOUNTER — Ambulatory Visit: Payer: Medicaid Other

## 2023-01-24 DIAGNOSIS — M25562 Pain in left knee: Secondary | ICD-10-CM | POA: Diagnosis not present

## 2023-01-24 DIAGNOSIS — G8929 Other chronic pain: Secondary | ICD-10-CM

## 2023-01-24 NOTE — Therapy (Signed)
OUTPATIENT PHYSICAL THERAPY LOWER EXTREMITY TREATMENT   Patient Name: Jack [name] MRN: [id number] DOB:[birth year], 57 y.o., male Today's Date: 01/24/2023  END OF SESSION:  PT End of Session - 01/24/23 1348     Visit Number 2    Number of Visits 6    Date for PT Re-Evaluation 02/16/23    PT Start Time 1345    PT Stop Time 1436    PT Time Calculation (min) 51 min    Activity Tolerance Patient tolerated treatment well    Behavior During Therapy [facility] for tasks assessed/performed             Past Medical History:  Diagnosis Date   Anxiety    Arthritis    GERD (gastroesophageal reflux disease)    Headache    occasional    History of kidney stones    Kidney stones [id number]   Past Surgical History:  Procedure Laterality Date   EXTRACORPOREAL SHOCK WAVE LITHOTRIPSY Right 05/30/2018   Procedure: RIGHT EXTRACORPOREAL SHOCK WAVE LITHOTRIPSY (ESWL);  Surgeon: Crist Fat, MD;  Location: [facility];  Service: Urology;  Laterality: Right;   KNEE SURGERY Left    3 arthroscopic,  two ACL reconstuctions   SHOULDER SURGERY Right    dislocation repair with a pin   TONSILLECTOMY AND ADENOIDECTOMY     TOTAL KNEE ARTHROPLASTY Left 07/12/2015   Procedure: TOTAL LEFT  KNEE ARTHROPLASTY;  Surgeon: Ollen Gross, MD;  Location: [facility];  Service: Orthopedics;  Laterality: Left;   tubes in ears     Patient Active Problem List   Diagnosis Date Noted   History of total left knee replacement 10/22/2015   Vitamin D deficiency 04/15/2015    PCP: Raliegh Ip, DO  REFERRING PROVIDER: Ollen Gross, MD   REFERRING DIAG: Encounter for other specified aftercare   THERAPY DIAG:  Chronic pain of left knee  Rationale for Evaluation and Treatment: Rehabilitation  ONSET DATE: 2016  SUBJECTIVE:   SUBJECTIVE STATEMENT: Pt reports 2-3/10 left knee pain today.  PERTINENT HISTORY: Anxiety, arthritis, chronic low back pain, and history of multiple knee surgeries PAIN:  Are you  having pain? Yes: NPRS scale: 2-3/10 Pain location: left knee Pain description: constant, sharp, and aching Aggravating factors: walking (10-15 minutes at most), standing (10-15 minutes), stairs, and walking up hills Relieving factors: ice and elevation   PRECAUTIONS: Fall  WEIGHT BEARING RESTRICTIONS: No  FALLS:  Has patient fallen in last 6 months? Yes. Number of falls 3-4; tripped over a rug in his house once and also got tangled up when cutting wood with most recent fall occurring in February 2024   LIVING ENVIRONMENT: Lives with: lives alone Lives in: House/apartment Stairs: Yes: External: 3 steps; none; step to pattern Has following equipment at home: None  OCCUPATION: not working  PLOF: Independent  PATIENT GOALS: reduced pain, improved strength and balance  NEXT MD VISIT: 01/18/23  OBJECTIVE:   COGNITION: Overall cognitive status: Within functional limits for tasks assessed     SENSATION: Patient reports no numbness or tingling currently  EDEMA:  No edema observed  POSTURE: No Significant postural limitations  PALPATION: TTP: left distal hip adductors and medial joint line  LOWER EXTREMITY ROM:  Active ROM Right eval Left eval  Hip flexion    Hip extension    Hip abduction    Hip adduction    Hip internal rotation    Hip external rotation    Knee flexion 134 117; limited by pain and stiffness  Knee extension 7 degrees of hyperextension 7 degrees of hyperextension   Ankle dorsiflexion    Ankle plantarflexion    Ankle inversion    Ankle eversion     (Blank rows = not tested)  LOWER EXTREMITY MMT:  MMT Right eval Left eval  Hip flexion 4+/5 4/5  Hip extension    Hip abduction    Hip adduction    Hip internal rotation    Hip external rotation    Knee flexion 5/5 4+/5  Knee extension 5/5 4/5; painful   Ankle dorsiflexion    Ankle plantarflexion    Ankle inversion    Ankle eversion     (Blank rows = not tested)  FUNCTIONAL TESTS:  5  times sit to stand: 19.54 seconds w/o UE support (patient reported bilateral knee pain)   GAIT: Assistive device utilized: None Level of assistance: Complete Independence  TODAY'S TREATMENT:                                                                                                                              DATE:                                      EXERCISE LOG  Exercise Repetitions and Resistance Comments  Nustep Lvl 3 x 16 mins   Heel/Toe Raises x20 reps each   Rockerboard x2.5 mins   LAQ 3# x 20 reps   Seated marches 3# x 20 reps   Ball Squeezes x2 mins   Clams Red t-band x2 mins   Ham Curls Red t-band x 20 reps   STS     Blank cell = exercise not performed today   Modalities  Date:  Unattended Estim: Knee, IFC 80-150 Hz, 15 mins, Pain Vaso: Knee, 34 degrees; med pressure, 15 mins, Pain and Edema   PATIENT EDUCATION:  Education details: Plan of care, prognosis, healing, and goals for therapy Person educated: Patient Education method: Explanation Education comprehension: verbalized understanding  HOME EXERCISE PROGRAM:   ASSESSMENT:  CLINICAL IMPRESSION: Pt arrives for today's treatment session 3/10 left knee pain.  Pt instructed in several standing and seated exercises today with minimal discomfort.  Pt requiring min cues for proper technique and posture with all newly added exercises today.  Normal responses to estim and vaso noted upon removal.  Pt reported decreased pain at completion of today's treatment session.  OBJECTIVE IMPAIRMENTS: decreased activity tolerance, decreased balance, decreased mobility, difficulty walking, decreased ROM, decreased strength, impaired tone, and pain.   ACTIVITY LIMITATIONS: lifting, standing, stairs, transfers, and locomotion level  PARTICIPATION LIMITATIONS: meal prep, cleaning, laundry, shopping, community activity, and yard work  PERSONAL FACTORS: Past/current experiences, Time since onset of  injury/illness/exacerbation, and 3+ comorbidities: Anxiety, arthritis, chronic low back pain, and history of multiple knee surgeries  are also affecting patient's functional outcome.   REHAB POTENTIAL: Fair  CLINICAL DECISION MAKING: Evolving/moderate complexity  EVALUATION COMPLEXITY: Moderate   GOALS: Goals reviewed with patient? Yes  LONG TERM GOALS: Target date: 02/06/23  Patient will be independent with his HEP. Baseline:  Goal status: INITIAL  2.  Patient will be able to complete his daily activities without his familiar pain exceeding 6/10. Baseline:  Goal status: INITIAL  3.  Patient will improve his 5 times sit to stand to 12 seconds or less for improved safety and lower extremity power. Baseline:  Goal status: INITIAL  4.  Patient will be able to walk for at least 25 minutes without being limited by his familiar left knee pain. Baseline:  Goal status: INITIAL  5.  Patient will be able to navigate at least 3 steps with a reciprocal pattern for improved household independence. Baseline:  Goal status: INITIAL  PLAN:  PT FREQUENCY: 2x/week  PT DURATION: 3 weeks  PLANNED INTERVENTIONS: Therapeutic exercises, Therapeutic activity, Neuromuscular re-education, Balance training, Patient/Family education, Self Care, Joint mobilization, Stair training, Electrical stimulation, Cryotherapy, Moist heat, Vasopneumatic device, Manual therapy, and Re-evaluation  PLAN FOR NEXT SESSION: Quadriceps and hamstring strengthening   Newman Pies, PTA 01/24/2023, 2:37 PM

## 2023-01-30 ENCOUNTER — Encounter: Payer: Self-pay | Admitting: *Deleted

## 2023-01-30 ENCOUNTER — Ambulatory Visit: Payer: Medicaid Other | Admitting: *Deleted

## 2023-01-30 DIAGNOSIS — M25562 Pain in left knee: Secondary | ICD-10-CM | POA: Diagnosis not present

## 2023-01-30 DIAGNOSIS — G8929 Other chronic pain: Secondary | ICD-10-CM

## 2023-01-30 NOTE — Therapy (Signed)
OUTPATIENT PHYSICAL THERAPY LOWER EXTREMITY TREATMENT   Patient Name: Jack [name] MRN: [id number] DOB:1965-12-25, 57 y.o., male Today's Date: 01/30/2023  END OF SESSION:  PT End of Session - 01/30/23 1528     Visit Number 3    Number of Visits 6    Date for PT Re-Evaluation 02/16/23    PT Start Time 1515    PT Stop Time 1610    PT Time Calculation (min) 55 min             Past Medical History:  Diagnosis Date   Anxiety    Arthritis    GERD (gastroesophageal reflux disease)    Headache    occasional    History of kidney stones    Kidney stones [id number]   Past Surgical History:  Procedure Laterality Date   EXTRACORPOREAL SHOCK WAVE LITHOTRIPSY Right 05/30/2018   Procedure: RIGHT EXTRACORPOREAL SHOCK WAVE LITHOTRIPSY (ESWL);  Surgeon: Crist Fat, MD;  Location: [facility];  Service: Urology;  Laterality: Right;   KNEE SURGERY Left    3 arthroscopic,  two ACL reconstuctions   SHOULDER SURGERY Right    dislocation repair with a pin   TONSILLECTOMY AND ADENOIDECTOMY     TOTAL KNEE ARTHROPLASTY Left 07/12/2015   Procedure: TOTAL LEFT  KNEE ARTHROPLASTY;  Surgeon: Ollen Gross, MD;  Location: [facility];  Service: Orthopedics;  Laterality: Left;   tubes in ears     Patient Active Problem List   Diagnosis Date Noted   History of total left knee replacement 10/22/2015   Vitamin D deficiency 04/15/2015    PCP: Raliegh Ip, DO  REFERRING PROVIDER: Ollen Gross, MD   REFERRING DIAG: Encounter for other specified aftercare   THERAPY DIAG:  Chronic pain of left knee  Rationale for Evaluation and Treatment: Rehabilitation  ONSET DATE: 2016  SUBJECTIVE:   SUBJECTIVE STATEMENT: Pt reports 2-3/10 left knee pain today.  PERTINENT HISTORY: Anxiety, arthritis, chronic low back pain, and history of multiple knee surgeries PAIN:  Are you having pain? Yes: NPRS scale: 2-3/10 Pain location: left knee Pain description: constant, sharp, and  aching Aggravating factors: walking (10-15 minutes at most), standing (10-15 minutes), stairs, and walking up hills Relieving factors: ice and elevation   PRECAUTIONS: Fall  WEIGHT BEARING RESTRICTIONS: No  FALLS:  Has patient fallen in last 6 months? Yes. Number of falls 3-4; tripped over a rug in his house once and also got tangled up when cutting wood with most recent fall occurring in February 2024   LIVING ENVIRONMENT: Lives with: lives alone Lives in: House/apartment Stairs: Yes: External: 3 steps; none; step to pattern Has following equipment at home: None  OCCUPATION: not working  PLOF: Independent  PATIENT GOALS: reduced pain, improved strength and balance  NEXT MD VISIT: 01/18/23  OBJECTIVE:   COGNITION: Overall cognitive status: Within functional limits for tasks assessed     SENSATION: Patient reports no numbness or tingling currently  EDEMA:  No edema observed  POSTURE: No Significant postural limitations  PALPATION: TTP: left distal hip adductors and medial joint line  LOWER EXTREMITY ROM:  Active ROM Right eval Left eval  Hip flexion    Hip extension    Hip abduction    Hip adduction    Hip internal rotation    Hip external rotation    Knee flexion 134 117; limited by pain and stiffness  Knee extension 7 degrees of hyperextension 7 degrees of hyperextension   Ankle dorsiflexion    Ankle  plantarflexion    Ankle inversion    Ankle eversion     (Blank rows = not tested)  LOWER EXTREMITY MMT:  MMT Right eval Left eval  Hip flexion 4+/5 4/5  Hip extension    Hip abduction    Hip adduction    Hip internal rotation    Hip external rotation    Knee flexion 5/5 4+/5  Knee extension 5/5 4/5; painful   Ankle dorsiflexion    Ankle plantarflexion    Ankle inversion    Ankle eversion     (Blank rows = not tested)  FUNCTIONAL TESTS:  5 times sit to stand: 19.54 seconds w/o UE support (patient reported bilateral knee pain)    GAIT: Assistive device utilized: None Level of assistance: Complete Independence  TODAY'S TREATMENT:                                                                                                                              DATE:                                                      01-30-23                                    EXERCISE LOG  Exercise Repetitions and Resistance Comments  Nustep Lvl 3 x LE's only   Heel/Toe Raises 2x20 reps each   Rockerboard x2.5 mins   LAQ 3# x 20 reps   Seated marches 3# x 20 reps   Ball Squeezes    Clams    Ham Curls t-band x 20 reps   STS     Blank cell = exercise not performed today   Modalities  Date:  Unattended Estim: Knee, IFC 80-150 Hz, 15 mins, Pain Vaso: Knee, 34 degrees; med pressure, 15 mins, Pain and Edema   PATIENT EDUCATION:  Education details: Plan of care, prognosis, healing, and goals for therapy Person educated: Patient Education method: Explanation Education comprehension: verbalized understanding  HOME EXERCISE PROGRAM:   ASSESSMENT:  CLINICAL IMPRESSION: Pt arrives for today's treatment session 3/10 left knee pain and reports being fairly sore after last Rx  Pt instructed in  standing and seated exercises today with minimal discomfort LT knee.  Pt having some c/o pain / tenderness lateral HS insertion LT knee. Normal responses to estim and vaso noted upon removal.  Pt reported decreased pain at completion of today's treatment session.  OBJECTIVE IMPAIRMENTS: decreased activity tolerance, decreased balance, decreased mobility, difficulty walking, decreased ROM, decreased strength, impaired tone, and pain.   ACTIVITY LIMITATIONS: lifting, standing, stairs, transfers, and locomotion level  PARTICIPATION LIMITATIONS: meal prep, cleaning, laundry, shopping, community activity, and yard work  PERSONAL FACTORS:  Past/current experiences, Time since onset of injury/illness/exacerbation, and 3+ comorbidities:  Anxiety, arthritis, chronic low back pain, and history of multiple knee surgeries  are also affecting patient's functional outcome.   REHAB POTENTIAL: Fair    CLINICAL DECISION MAKING: Evolving/moderate complexity  EVALUATION COMPLEXITY: Moderate   GOALS: Goals reviewed with patient? Yes  LONG TERM GOALS: Target date: 02/06/23  Patient will be independent with his HEP. Baseline:  Goal status: INITIAL  2.  Patient will be able to complete his daily activities without his familiar pain exceeding 6/10. Baseline:  Goal status: INITIAL  3.  Patient will improve his 5 times sit to stand to 12 seconds or less for improved safety and lower extremity power. Baseline:  Goal status: INITIAL  4.  Patient will be able to walk for at least 25 minutes without being limited by his familiar left knee pain. Baseline:  Goal status: INITIAL  5.  Patient will be able to navigate at least 3 steps with a reciprocal pattern for improved household independence. Baseline:  Goal status: INITIAL  PLAN:  PT FREQUENCY: 2x/week  PT DURATION: 3 weeks  PLANNED INTERVENTIONS: Therapeutic exercises, Therapeutic activity, Neuromuscular re-education, Balance training, Patient/Family education, Self Care, Joint mobilization, Stair training, Electrical stimulation, Cryotherapy, Moist heat, Vasopneumatic device, Manual therapy, and Re-evaluation  PLAN FOR NEXT SESSION: Quadriceps and hamstring strengthening   Rowyn Mustapha,CHRIS, PTA 01/30/2023, 5:24 PM

## 2023-02-01 ENCOUNTER — Ambulatory Visit: Payer: Medicaid Other | Admitting: *Deleted

## 2023-02-01 ENCOUNTER — Encounter: Payer: Self-pay | Admitting: *Deleted

## 2023-02-01 DIAGNOSIS — G8929 Other chronic pain: Secondary | ICD-10-CM

## 2023-02-01 DIAGNOSIS — M25562 Pain in left knee: Secondary | ICD-10-CM | POA: Diagnosis not present

## 2023-02-01 NOTE — Therapy (Signed)
OUTPATIENT PHYSICAL THERAPY LOWER EXTREMITY TREATMENT   Patient Name: Jack [name] MRN: [id number] DOB:09-27-1966, 57 y.o., male Today's Date: 02/01/2023  END OF SESSION:  PT End of Session - 02/01/23 1520     Visit Number 4    Number of Visits 6    Date for PT Re-Evaluation 02/16/23    PT Start Time 1515    PT Stop Time 1605    PT Time Calculation (min) 50 min             Past Medical History:  Diagnosis Date   Anxiety    Arthritis    GERD (gastroesophageal reflux disease)    Headache    occasional    History of kidney stones    Kidney stones [id number]   Past Surgical History:  Procedure Laterality Date   EXTRACORPOREAL SHOCK WAVE LITHOTRIPSY Right 05/30/2018   Procedure: RIGHT EXTRACORPOREAL SHOCK WAVE LITHOTRIPSY (ESWL);  Surgeon: Crist Fat, MD;  Location: [facility];  Service: Urology;  Laterality: Right;   KNEE SURGERY Left    3 arthroscopic,  two ACL reconstuctions   SHOULDER SURGERY Right    dislocation repair with a pin   TONSILLECTOMY AND ADENOIDECTOMY     TOTAL KNEE ARTHROPLASTY Left 07/12/2015   Procedure: TOTAL LEFT  KNEE ARTHROPLASTY;  Surgeon: Ollen Gross, MD;  Location: [facility];  Service: Orthopedics;  Laterality: Left;   tubes in ears     Patient Active Problem List   Diagnosis Date Noted   History of total left knee replacement 10/22/2015   Vitamin D deficiency 04/15/2015    PCP: Raliegh Ip, DO  REFERRING PROVIDER: Ollen Gross, MD   REFERRING DIAG: Encounter for other specified aftercare   THERAPY DIAG:  Chronic pain of left knee  Rationale for Evaluation and Treatment: Rehabilitation  ONSET DATE: 2016  SUBJECTIVE:   SUBJECTIVE STATEMENT: Pt reports 2-3/10 left knee pain today.  PERTINENT HISTORY: Anxiety, arthritis, chronic low back pain, and history of multiple knee surgeries PAIN:  Are you having pain? Yes: NPRS scale: 2-3/10 Pain location: left knee Pain description: constant, sharp, and  aching Aggravating factors: walking (10-15 minutes at most), standing (10-15 minutes), stairs, and walking up hills Relieving factors: ice and elevation   PRECAUTIONS: Fall  WEIGHT BEARING RESTRICTIONS: No  FALLS:  Has patient fallen in last 6 months? Yes. Number of falls 3-4; tripped over a rug in his house once and also got tangled up when cutting wood with most recent fall occurring in February 2024   LIVING ENVIRONMENT: Lives with: lives alone Lives in: House/apartment Stairs: Yes: External: 3 steps; none; step to pattern Has following equipment at home: None  OCCUPATION: not working  PLOF: Independent  PATIENT GOALS: reduced pain, improved strength and balance  NEXT MD VISIT: 01/18/23  OBJECTIVE:   COGNITION: Overall cognitive status: Within functional limits for tasks assessed     SENSATION: Patient reports no numbness or tingling currently  EDEMA:  No edema observed  POSTURE: No Significant postural limitations  PALPATION: TTP: left distal hip adductors and medial joint line  LOWER EXTREMITY ROM:  Active ROM Right eval Left eval  Hip flexion    Hip extension    Hip abduction    Hip adduction    Hip internal rotation    Hip external rotation    Knee flexion 134 117; limited by pain and stiffness  Knee extension 7 degrees of hyperextension 7 degrees of hyperextension   Ankle dorsiflexion    Ankle  plantarflexion    Ankle inversion    Ankle eversion     (Blank rows = not tested)  LOWER EXTREMITY MMT:  MMT Right eval Left eval  Hip flexion 4+/5 4/5  Hip extension    Hip abduction    Hip adduction    Hip internal rotation    Hip external rotation    Knee flexion 5/5 4+/5  Knee extension 5/5 4/5; painful   Ankle dorsiflexion    Ankle plantarflexion    Ankle inversion    Ankle eversion     (Blank rows = not tested)  FUNCTIONAL TESTS:  5 times sit to stand: 19.54 seconds w/o UE support (patient reported bilateral knee pain)    GAIT: Assistive device utilized: None Level of assistance: Complete Independence  TODAY'S TREATMENT:                                                                                                                              DATE:                                                      02-01-23                                    EXERCISE LOG            LT knee  Exercise Repetitions and Resistance Comments  Nustep Lvl 3 x 13 mins LE's only   Heel/Toe Raises 2x20 reps each   Rockerboard x2.5 mins   8in box lunge x6   Knee EXT 10# 3x10   Knee Flex 20# 3x10   LAQ    Seated marches    Ball Squeezes    Clams    Ham Curls    STS     Blank cell = exercise not performed today   Modalities  Date:  Unattended Estim: Knee, IFC 80-150 Hz, 15 mins, Pain Vaso: Knee, 34 degrees; med pressure, 15 mins, Pain and Edema   PATIENT EDUCATION:  Education details: Plan of care, prognosis, healing, and goals for therapy Person educated: Patient Education method: Explanation Education comprehension: verbalized understanding  HOME EXERCISE PROGRAM:   ASSESSMENT:  CLINICAL IMPRESSION: Pt arrives for today's treatment doing fairly well and was able to progress exs. Knee extension and flexion machines added and tol.  well . Pt reports  having some popping during exs., but did well. Vaso and estim LT knee.     OBJECTIVE IMPAIRMENTS: decreased activity tolerance, decreased balance, decreased mobility, difficulty walking, decreased ROM, decreased strength, impaired tone, and pain.   ACTIVITY LIMITATIONS: lifting, standing, stairs, transfers, and locomotion level  PARTICIPATION LIMITATIONS: meal prep, cleaning, laundry, shopping, community activity, and yard work  PERSONAL  FACTORS: Past/current experiences, Time since onset of injury/illness/exacerbation, and 3+ comorbidities: Anxiety, arthritis, chronic low back pain, and history of multiple knee surgeries  are also affecting patient's  functional outcome.   REHAB POTENTIAL: Fair    CLINICAL DECISION MAKING: Evolving/moderate complexity  EVALUATION COMPLEXITY: Moderate   GOALS: Goals reviewed with patient? Yes  LONG TERM GOALS: Target date: 02/06/23  Patient will be independent with his HEP. Baseline:  Goal status: INITIAL  2.  Patient will be able to complete his daily activities without his familiar pain exceeding 6/10. Baseline:  Goal status: INITIAL  3.  Patient will improve his 5 times sit to stand to 12 seconds or less for improved safety and lower extremity power. Baseline:  Goal status: INITIAL  4.  Patient will be able to walk for at least 25 minutes without being limited by his familiar left knee pain. Baseline:  Goal status: INITIAL  5.  Patient will be able to navigate at least 3 steps with a reciprocal pattern for improved household independence. Baseline:  Goal status: INITIAL  PLAN:  PT FREQUENCY: 2x/week  PT DURATION: 3 weeks  PLANNED INTERVENTIONS: Therapeutic exercises, Therapeutic activity, Neuromuscular re-education, Balance training, Patient/Family education, Self Care, Joint mobilization, Stair training, Electrical stimulation, Cryotherapy, Moist heat, Vasopneumatic device, Manual therapy, and Re-evaluation  PLAN FOR NEXT SESSION: Quadriceps and hamstring strengthening   Broghan Pannone,CHRIS, PTA 02/01/2023, 4:12 PM

## 2023-02-05 ENCOUNTER — Ambulatory Visit (INDEPENDENT_AMBULATORY_CARE_PROVIDER_SITE_OTHER): Payer: Medicaid Other

## 2023-02-05 ENCOUNTER — Ambulatory Visit (INDEPENDENT_AMBULATORY_CARE_PROVIDER_SITE_OTHER): Payer: Medicaid Other | Admitting: Family Medicine

## 2023-02-05 ENCOUNTER — Ambulatory Visit: Payer: Medicaid Other

## 2023-02-05 ENCOUNTER — Encounter: Payer: Self-pay | Admitting: Family Medicine

## 2023-02-05 VITALS — BP 111/77 | HR 67 | Temp 98.3°F | Ht 68.0 in | Wt 190.0 lb

## 2023-02-05 DIAGNOSIS — G8929 Other chronic pain: Secondary | ICD-10-CM

## 2023-02-05 DIAGNOSIS — M5441 Lumbago with sciatica, right side: Secondary | ICD-10-CM

## 2023-02-05 DIAGNOSIS — M25562 Pain in left knee: Secondary | ICD-10-CM | POA: Diagnosis not present

## 2023-02-05 DIAGNOSIS — Z1211 Encounter for screening for malignant neoplasm of colon: Secondary | ICD-10-CM

## 2023-02-05 DIAGNOSIS — F419 Anxiety disorder, unspecified: Secondary | ICD-10-CM

## 2023-02-05 DIAGNOSIS — F430 Acute stress reaction: Secondary | ICD-10-CM | POA: Diagnosis not present

## 2023-02-05 MED ORDER — METHOCARBAMOL 500 MG PO TABS: 500.0000 mg | ORAL_TABLET | Freq: Two times a day (BID) | ORAL | 2 refills | Status: DC | PRN

## 2023-02-05 MED ORDER — DULOXETINE HCL 60 MG PO CPEP: 60.0000 mg | ORAL_CAPSULE | Freq: Every day | ORAL | 3 refills | Status: DC

## 2023-02-05 NOTE — Progress Notes (Unsigned)
Subjective: CC:*** PCP: Raliegh Ip, DO Jack [name] is a 57 y.o. male presenting to clinic today for:  1. ***   ROS: Per HPI  Allergies  Allergen Reactions   Tramadol Nausea And Vomiting   Past Medical History:  Diagnosis Date   Anxiety    Arthritis    GERD (gastroesophageal reflux disease)    Headache    occasional    History of kidney stones    Kidney stones [id number]    Current Outpatient Medications:    albuterol (VENTOLIN HFA) 108 (90 Base) MCG/ACT inhaler, Inhale 2 puffs into the lungs every 6 (six) hours as needed for wheezing or shortness of breath., Disp: 8 g, Rfl: 0   celecoxib (CELEBREX) 100 MG capsule, TAKE 1 CAPSULE BY MOUTH TWICE DAILY WITH MEALS, Disp: , Rfl:    DULoxetine (CYMBALTA) 30 MG capsule, Take 1 capsule (30 mg total) by mouth daily for 30 days, THEN 2 capsules (60 mg total) daily., Disp: 90 capsule, Rfl: 0   methocarbamol (ROBAXIN) 500 MG tablet, Take 1 tablet (500 mg total) by mouth 2 (two) times daily as needed for muscle spasms., Disp: 30 tablet, Rfl: 0 Social History   Socioeconomic History   Marital status: Single    Spouse name: Not on file   Number of children: Not on file   Years of education: Not on file   Highest education level: Not on file  Occupational History   Not on file  Tobacco Use   Smoking status: Never   Smokeless tobacco: Former    Types: Associate Professor Use: Never used  Substance and Sexual Activity   Alcohol use: Not Currently    Alcohol/week: 0.0 standard drinks of alcohol    Comment: occasional    Drug use: No   Sexual activity: Not on file  Other Topics Concern   Not on file  Social History Narrative   Not on file   Social Determinants of Health   Financial Resource Strain: Not on file  Food Insecurity: Not on file  Transportation Needs: Not on file  Physical Activity: Not on file  Stress: Not on file  Social Connections: Not on file  Intimate Partner Violence: Not on file    Family History  Problem Relation Age of Onset   Arthritis Mother     Objective: Office vital signs reviewed. There were no vitals taken for this visit.  Physical Examination:  General: Awake, alert, *** nourished, No acute distress HEENT: Normal    Neck: No masses palpated. No lymphadenopathy    Ears: Tympanic membranes intact, normal light reflex, no erythema, no bulging    Eyes: PERRLA, extraocular membranes intact, sclera ***    Nose: nasal turbinates moist, *** nasal discharge    Throat: moist mucus membranes, no erythema, *** tonsillar exudate.  Airway is patent Cardio: regular rate and rhythm, S1S2 heard, no murmurs appreciated Pulm: clear to auscultation bilaterally, no wheezes, rhonchi or rales; normal work of breathing on room air GI: soft, non-tender, non-distended, bowel sounds present x4, no hepatomegaly, no splenomegaly, no masses GU: external vaginal tissue ***, cervix ***, *** punctate lesions on cervix appreciated, *** discharge from cervical os, *** bleeding, *** cervical motion tenderness, *** abdominal/ adnexal masses Extremities: warm, well perfused, No edema, cyanosis or clubbing; +*** pulses bilaterally MSK: *** gait and *** station Skin: dry; intact; no rashes or lesions Neuro: *** Strength and light touch sensation grossly intact, *** DTRs ***/4  Assessment/  Plan: 57 y.o. male   ***  No orders of the defined types were placed in this encounter.  No orders of the defined types were placed in this encounter.    Raliegh Ip, DO [facility] [phone]

## 2023-02-05 NOTE — Therapy (Signed)
OUTPATIENT PHYSICAL THERAPY LOWER EXTREMITY TREATMENT   Patient Name: Jack [name] MRN: [id number] DOB:04-04-1966, 57 y.o., male Today's Date: 02/05/2023  END OF SESSION:  PT End of Session - 02/05/23 1347     Visit Number 5    Number of Visits 6    Date for PT Re-Evaluation 02/16/23    PT Start Time 1345    PT Stop Time 1438    PT Time Calculation (min) 53 min             Past Medical History:  Diagnosis Date   Anxiety    Arthritis    GERD (gastroesophageal reflux disease)    Headache    occasional    History of kidney stones    Kidney stones [id number]   Past Surgical History:  Procedure Laterality Date   EXTRACORPOREAL SHOCK WAVE LITHOTRIPSY Right 05/30/2018   Procedure: RIGHT EXTRACORPOREAL SHOCK WAVE LITHOTRIPSY (ESWL);  Surgeon: Crist Fat, MD;  Location: [facility];  Service: Urology;  Laterality: Right;   KNEE SURGERY Left    3 arthroscopic,  two ACL reconstuctions   SHOULDER SURGERY Right    dislocation repair with a pin   TONSILLECTOMY AND ADENOIDECTOMY     TOTAL KNEE ARTHROPLASTY Left 07/12/2015   Procedure: TOTAL LEFT  KNEE ARTHROPLASTY;  Surgeon: Ollen Gross, MD;  Location: [facility];  Service: Orthopedics;  Laterality: Left;   tubes in ears     Patient Active Problem List   Diagnosis Date Noted   History of total left knee replacement 10/22/2015   Vitamin D deficiency 04/15/2015    PCP: Raliegh Ip, DO  REFERRING PROVIDER: Ollen Gross, MD   REFERRING DIAG: Encounter for other specified aftercare   THERAPY DIAG:  Chronic pain of left knee  Rationale for Evaluation and Treatment: Rehabilitation  ONSET DATE: 2016  SUBJECTIVE:   SUBJECTIVE STATEMENT: Pt reports 4/10 left knee pain today.   PERTINENT HISTORY: Anxiety, arthritis, chronic low back pain, and history of multiple knee surgeries PAIN:  Are you having pain? Yes: NPRS scale: 4/10 Pain location: left knee Pain description: constant, sharp, and aching Aggravating  factors: walking (10-15 minutes at most), standing (10-15 minutes), stairs, and walking up hills Relieving factors: ice and elevation   PRECAUTIONS: Fall  WEIGHT BEARING RESTRICTIONS: No  FALLS:  Has patient fallen in last 6 months? Yes. Number of falls 3-4; tripped over a rug in his house once and also got tangled up when cutting wood with most recent fall occurring in February 2024   LIVING ENVIRONMENT: Lives with: lives alone Lives in: House/apartment Stairs: Yes: External: 3 steps; none; step to pattern Has following equipment at home: None  OCCUPATION: not working  PLOF: Independent  PATIENT GOALS: reduced pain, improved strength and balance  NEXT MD VISIT: 01/18/23  OBJECTIVE:   COGNITION: Overall cognitive status: Within functional limits for tasks assessed     SENSATION: Patient reports no numbness or tingling currently  EDEMA:  No edema observed  POSTURE: No Significant postural limitations  PALPATION: TTP: left distal hip adductors and medial joint line  LOWER EXTREMITY ROM:  Active ROM Right eval Left eval  Hip flexion    Hip extension    Hip abduction    Hip adduction    Hip internal rotation    Hip external rotation    Knee flexion 134 117; limited by pain and stiffness  Knee extension 7 degrees of hyperextension 7 degrees of hyperextension   Ankle dorsiflexion  Ankle plantarflexion    Ankle inversion    Ankle eversion     (Blank rows = not tested)  LOWER EXTREMITY MMT:  MMT Right eval Left eval  Hip flexion 4+/5 4/5  Hip extension    Hip abduction    Hip adduction    Hip internal rotation    Hip external rotation    Knee flexion 5/5 4+/5  Knee extension 5/5 4/5; painful   Ankle dorsiflexion    Ankle plantarflexion    Ankle inversion    Ankle eversion     (Blank rows = not tested)  FUNCTIONAL TESTS:  5 times sit to stand: 19.54 seconds w/o UE support (patient reported bilateral knee pain)   GAIT: Assistive device  utilized: None Level of assistance: Complete Independence  TODAY'S TREATMENT:                                                                                                                              DATE:                                                      02-05-23                                    EXERCISE LOG            LT knee  Exercise Repetitions and Resistance Comments  Nustep Lvl 4 x 15 mins LE's only   Heel/Toe Raises 2x20 reps each   Rockerboard X3 mins   8in box lunge x10   Knee EXT 20# x3 mins   Knee Flex 30# x3 mins   LAQ    Seated marches    Ball Squeezes    Clams    Ham Curls    STS x15    Blank cell = exercise not performed today   Modalities  Date:  Unattended Estim: Knee, IFC 80-150 Hz, 10 mins, Pain Vaso: Knee, 34 degrees; med pressure, 10 mins, Pain and Edema   PATIENT EDUCATION:  Education details: Plan of care, prognosis, healing, and goals for therapy Person educated: Patient Education method: Explanation Education comprehension: verbalized understanding  HOME EXERCISE PROGRAM:   ASSESSMENT:  CLINICAL IMPRESSION: Pt arrives for today's treatment session reporting 4/10 left knee pain.  Pt able to perform 5 STS test in 10.63 seconds meeting his LTG.  Pt able to tolerate increased time and/or reps with all exercises today without pain.  Pt requiring min postural cues for standing exercises.  Pt is making good progress towards his goals at this time.  Normal responses to estm and vaso noted upon removal.  Pt reported 2/10 left knee pain upon completion of today's treatment session.  OBJECTIVE IMPAIRMENTS: decreased activity tolerance, decreased balance, decreased mobility, difficulty walking, decreased ROM, decreased strength, impaired tone, and pain.   ACTIVITY LIMITATIONS: lifting, standing, stairs, transfers, and locomotion level  PARTICIPATION LIMITATIONS: meal prep, cleaning, laundry, shopping, community activity, and yard  work  PERSONAL FACTORS: Past/current experiences, Time since onset of injury/illness/exacerbation, and 3+ comorbidities: Anxiety, arthritis, chronic low back pain, and history of multiple knee surgeries  are also affecting patient's functional outcome.   REHAB POTENTIAL: Fair    CLINICAL DECISION MAKING: Evolving/moderate complexity  EVALUATION COMPLEXITY: Moderate   GOALS: Goals reviewed with patient? Yes  LONG TERM GOALS: Target date: 02/06/23  Patient will be independent with his HEP. Baseline:  Goal status: IN PROGRESS  2.  Patient will be able to complete his daily activities without his familiar pain exceeding 6/10. Baseline:  Goal status: IN PROGRESS  3.  Patient will improve his 5 times sit to stand to 12 seconds or less for improved safety and lower extremity power. Baseline: 4/29: 10.6 seconds Goal status: MET  4.  Patient will be able to walk for at least 25 minutes without being limited by his familiar left knee pain. Baseline: 4/29: 10-15 mins Goal status: IN PROGRESS  5.  Patient will be able to navigate at least 3 steps with a reciprocal pattern for improved household independence. Baseline:  Goal status: IN PROGRESS  PLAN:  PT FREQUENCY: 2x/week  PT DURATION: 3 weeks  PLANNED INTERVENTIONS: Therapeutic exercises, Therapeutic activity, Neuromuscular re-education, Balance training, Patient/Family education, Self Care, Joint mobilization, Stair training, Electrical stimulation, Cryotherapy, Moist heat, Vasopneumatic device, Manual therapy, and Re-evaluation  PLAN FOR NEXT SESSION: Quadriceps and hamstring strengthening   Newman Pies, PTA 02/05/2023, 2:43 PM

## 2023-02-06 ENCOUNTER — Encounter: Payer: Self-pay | Admitting: *Deleted

## 2023-02-07 ENCOUNTER — Ambulatory Visit: Payer: Medicaid Other | Attending: Orthopedic Surgery

## 2023-02-07 DIAGNOSIS — G8929 Other chronic pain: Secondary | ICD-10-CM | POA: Insufficient documentation

## 2023-02-07 DIAGNOSIS — M25562 Pain in left knee: Secondary | ICD-10-CM | POA: Diagnosis present

## 2023-02-07 NOTE — Therapy (Addendum)
OUTPATIENT PHYSICAL THERAPY LOWER EXTREMITY TREATMENT   Patient Name: Jack [name] MRN: [id number] DOB:[birth year], 57 y.o., male Today's Date: 02/07/2023  END OF SESSION:  PT End of Session - 02/07/23 1314     Visit Number 6    Number of Visits 6    Date for PT Re-Evaluation 02/16/23    PT Start Time 1300    PT Stop Time 1357    PT Time Calculation (min) 57 min             Past Medical History:  Diagnosis Date   Anxiety    Arthritis    GERD (gastroesophageal reflux disease)    Headache    occasional    History of kidney stones    Kidney stones [id number]   Past Surgical History:  Procedure Laterality Date   EXTRACORPOREAL SHOCK WAVE LITHOTRIPSY Right 05/30/2018   Procedure: RIGHT EXTRACORPOREAL SHOCK WAVE LITHOTRIPSY (ESWL);  Surgeon: Crist Fat, MD;  Location: [facility];  Service: Urology;  Laterality: Right;   KNEE SURGERY Left    3 arthroscopic,  two ACL reconstuctions   SHOULDER SURGERY Right    dislocation repair with a pin   TONSILLECTOMY AND ADENOIDECTOMY     TOTAL KNEE ARTHROPLASTY Left 07/12/2015   Procedure: TOTAL LEFT  KNEE ARTHROPLASTY;  Surgeon: Ollen Gross, MD;  Location: [facility];  Service: Orthopedics;  Laterality: Left;   tubes in ears     Patient Active Problem List   Diagnosis Date Noted   History of total left knee replacement 10/22/2015   Vitamin D deficiency 04/15/2015    PCP: Raliegh Ip, DO  REFERRING PROVIDER: Ollen Gross, MD   REFERRING DIAG: Encounter for other specified aftercare   THERAPY DIAG:  Chronic pain of left knee  Rationale for Evaluation and Treatment: Rehabilitation  ONSET DATE: 2016  SUBJECTIVE:   SUBJECTIVE STATEMENT: Pt reports 4-5/10 left knee pain today.  Pt has MD appointment tomorrow.  PERTINENT HISTORY: Anxiety, arthritis, chronic low back pain, and history of multiple knee surgeries PAIN:  Are you having pain? Yes: NPRS scale: 4-5/10 Pain location: left knee Pain description:  constant, sharp, and aching Aggravating factors: walking (10-15 minutes at most), standing (10-15 minutes), stairs, and walking up hills Relieving factors: ice and elevation   PRECAUTIONS: Fall  WEIGHT BEARING RESTRICTIONS: No  FALLS:  Has patient fallen in last 6 months? Yes. Number of falls 3-4; tripped over a rug in his house once and also got tangled up when cutting wood with most recent fall occurring in February 2024   LIVING ENVIRONMENT: Lives with: lives alone Lives in: House/apartment Stairs: Yes: External: 3 steps; none; step to pattern Has following equipment at home: None  OCCUPATION: not working  PLOF: Independent  PATIENT GOALS: reduced pain, improved strength and balance  NEXT MD VISIT: 01/18/23  OBJECTIVE:   COGNITION: Overall cognitive status: Within functional limits for tasks assessed     SENSATION: Patient reports no numbness or tingling currently  EDEMA:  No edema observed  POSTURE: No Significant postural limitations  PALPATION: TTP: left distal hip adductors and medial joint line  LOWER EXTREMITY ROM:  Active ROM Right eval Left eval  Hip flexion    Hip extension    Hip abduction    Hip adduction    Hip internal rotation    Hip external rotation    Knee flexion 134 117; limited by pain and stiffness  Knee extension 7 degrees of hyperextension 7 degrees of hyperextension  Ankle dorsiflexion    Ankle plantarflexion    Ankle inversion    Ankle eversion     (Blank rows = not tested)  LOWER EXTREMITY MMT:  MMT Right eval Left eval  Hip flexion 4+/5 4/5  Hip extension    Hip abduction    Hip adduction    Hip internal rotation    Hip external rotation    Knee flexion 5/5 4+/5  Knee extension 5/5 4/5; painful   Ankle dorsiflexion    Ankle plantarflexion    Ankle inversion    Ankle eversion     (Blank rows = not tested)  FUNCTIONAL TESTS:  5 times sit to stand: 19.54 seconds w/o UE support (patient reported bilateral knee  pain)   GAIT: Assistive device utilized: None Level of assistance: Complete Independence  TODAY'S TREATMENT:                                                                                                                              DATE:02-07-23                                    EXERCISE LOG            LT knee  Exercise Repetitions and Resistance Comments  Nustep Lvl 4 x 15 mins LE's only   Heel/Toe Raises X3 mins   Rockerboard X3 mins   8in box lunge x15   Knee EXT 20# x3.5 mins   Knee Flex 30# x3.5 mins   Leg Press 3 plate; seat 6 x 3 mins   STS x15    Blank cell = exercise not performed today   Modalities  Date:  Unattended Estim: Knee, IFC 80-150 Hz, 15 mins, Pain Vaso: Knee, 34 degrees; med pressure, 15 mins, Pain and Edema   PATIENT EDUCATION:  Education details: Plan of care, prognosis, healing, and goals for therapy Person educated: Patient Education method: Explanation Education comprehension: verbalized understanding  HOME EXERCISE PROGRAM:   ASSESSMENT:  CLINICAL IMPRESSION: Pt arrives for today's treatment session reporting 4-5/10 left knee pain.  Pt is making progress towards all of his goals at this time.  Pt with continued left knee pain with daily activities and ambulation.  Pt able to tolerate increased time or reps with all exercises performed today with min cues for technique and eccentric control.  Normal responses to estim and vaso noted upon removal.  Pt reported 3/10 left knee pain at completion of today's treatment session.  PHYSICAL THERAPY DISCHARGE SUMMARY  Visits from Start of Care: 6  Current functional level related to goals / functional outcomes: Patient was able to partially meet his goals for skilled physical therapy.    Remaining deficits: Pain, endurance, and lower extremity power   Education / Equipment: HEP   Patient agrees to discharge. Patient goals were partially met. Patient is being discharged due to  the patient's  request.  Candi Leash, PT, DPT    OBJECTIVE IMPAIRMENTS: decreased activity tolerance, decreased balance, decreased mobility, difficulty walking, decreased ROM, decreased strength, impaired tone, and pain.   ACTIVITY LIMITATIONS: lifting, standing, stairs, transfers, and locomotion level  PARTICIPATION LIMITATIONS: meal prep, cleaning, laundry, shopping, community activity, and yard work  PERSONAL FACTORS: Past/current experiences, Time since onset of injury/illness/exacerbation, and 3+ comorbidities: Anxiety, arthritis, chronic low back pain, and history of multiple knee surgeries  are also affecting patient's functional outcome.   REHAB POTENTIAL: Fair    CLINICAL DECISION MAKING: Evolving/moderate complexity  EVALUATION COMPLEXITY: Moderate   GOALS: Goals reviewed with patient? Yes  LONG TERM GOALS: Target date: 02/06/23  Patient will be independent with his HEP. Baseline: Goal status: MET  2.  Patient will be able to complete his daily activities without his familiar pain exceeding 6/10. Baseline: 5/1: average 7/10 Goal status: IN PROGRESS  3.  Patient will improve his 5 times sit to stand to 12 seconds or less for improved safety and lower extremity power. Baseline: 4/29: 10.6 seconds Goal status: MET  4.  Patient will be able to walk for at least 25 minutes without being limited by his familiar left knee pain. Baseline: 4/29: 10-15 mins Goal status: IN PROGRESS  5.  Patient will be able to navigate at least 3 steps with a reciprocal pattern for improved household independence. Baseline: 5/1: step-to pattern Goal status: IN PROGRESS  PLAN:  PT FREQUENCY: 2x/week  PT DURATION: 3 weeks  PLANNED INTERVENTIONS: Therapeutic exercises, Therapeutic activity, Neuromuscular re-education, Balance training, Patient/Family education, Self Care, Joint mobilization, Stair training, Electrical stimulation, Cryotherapy, Moist heat, Vasopneumatic device, Manual therapy, and  Re-evaluation  PLAN FOR NEXT SESSION: Quadriceps and hamstring strengthening   Newman Pies, PTA 02/07/2023, 1:57 PM

## 2023-02-08 DIAGNOSIS — Z96652 Presence of left artificial knee joint: Secondary | ICD-10-CM | POA: Diagnosis not present

## 2023-02-14 DIAGNOSIS — M5451 Vertebrogenic low back pain: Secondary | ICD-10-CM | POA: Diagnosis not present

## 2023-02-14 DIAGNOSIS — M5416 Radiculopathy, lumbar region: Secondary | ICD-10-CM | POA: Diagnosis not present

## 2023-02-14 DIAGNOSIS — M47896 Other spondylosis, lumbar region: Secondary | ICD-10-CM | POA: Diagnosis not present

## 2023-02-21 ENCOUNTER — Ambulatory Visit: Payer: Medicaid Other | Attending: Anesthesiology

## 2023-02-21 ENCOUNTER — Other Ambulatory Visit: Payer: Self-pay

## 2023-02-21 DIAGNOSIS — M5459 Other low back pain: Secondary | ICD-10-CM | POA: Insufficient documentation

## 2023-02-21 DIAGNOSIS — M25562 Pain in left knee: Secondary | ICD-10-CM | POA: Insufficient documentation

## 2023-02-21 DIAGNOSIS — G8929 Other chronic pain: Secondary | ICD-10-CM | POA: Insufficient documentation

## 2023-02-21 NOTE — Therapy (Signed)
OUTPATIENT PHYSICAL THERAPY THORACOLUMBAR EVALUATION   Patient Name: Jack [name] MRN: [id number] DOB:Mar 06, 1966, 57 y.o., male Today's Date: 02/21/2023  END OF SESSION:  PT End of Session - 02/21/23 1040     Visit Number 1    Number of Visits 6    Date for PT Re-Evaluation 03/23/23    PT Start Time 1041    PT Stop Time 1110    PT Time Calculation (min) 29 min    Activity Tolerance Patient limited by pain    Behavior During Therapy [facility] for tasks assessed/performed             Past Medical History:  Diagnosis Date   Anxiety    Arthritis    GERD (gastroesophageal reflux disease)    Headache    occasional    History of kidney stones    Kidney stones [id number]   Past Surgical History:  Procedure Laterality Date   EXTRACORPOREAL SHOCK WAVE LITHOTRIPSY Right 05/30/2018   Procedure: RIGHT EXTRACORPOREAL SHOCK WAVE LITHOTRIPSY (ESWL);  Surgeon: Crist Fat, MD;  Location: [facility];  Service: Urology;  Laterality: Right;   KNEE SURGERY Left    3 arthroscopic,  two ACL reconstuctions   SHOULDER SURGERY Right    dislocation repair with a pin   TONSILLECTOMY AND ADENOIDECTOMY     TOTAL KNEE ARTHROPLASTY Left 07/12/2015   Procedure: TOTAL LEFT  KNEE ARTHROPLASTY;  Surgeon: Ollen Gross, MD;  Location: [facility];  Service: Orthopedics;  Laterality: Left;   tubes in ears     Patient Active Problem List   Diagnosis Date Noted   History of total left knee replacement 10/22/2015   Vitamin D deficiency 04/15/2015    PCP: Raliegh Ip, DO  REFERRING PROVIDER: Windle Guard, MD  REFERRING DIAG: Vertebrogenic low back pain  Rationale for Evaluation and Treatment: Rehabilitation  THERAPY DIAG:  Other low back pain  ONSET DATE: chronic pain w/ flare up 2 weeks ago   SUBJECTIVE:                                                                                                                                                                                            SUBJECTIVE STATEMENT: Patient reports that he has multiple back problems which include DDD, bone spurs, and other conditions that they found in his low back. He notes that his pain starts in his back and radiates down his right leg. He was told that he needed 6 visits of therapy before he can get a MRI. He has noticed that his back pain has been getting worse over the past 2 weeks. He  feels that he is not doing anything to make his pain worse, but it continues to hurt more.   PERTINENT HISTORY:  Anxiety, depression, arthritis, and left knee pain  PAIN:  Are you having pain? Yes: NPRS scale: 7-8/10 Pain location: low back and right leg  Pain description: sharp radiating pain, throbbing  Aggravating factors: everything causes pain  Relieving factors: medication (slightly)  PRECAUTIONS: Fall  WEIGHT BEARING RESTRICTIONS: No  FALLS:  Has patient fallen in last 6 months? Yes. Number of falls 2; while cutting wood and getting tangled up in some limbs  LIVING ENVIRONMENT: Lives with: lives alone Lives in: House/apartment Stairs: Yes: External: 3 steps; none; step to pattern Has following equipment at home: None  OCCUPATION: not working  PLOF: Independent  PATIENT GOALS: get a MRI   NEXT MD VISIT: 03/28/23  OBJECTIVE:   DIAGNOSTIC FINDINGS: 02/05/23 lumbar x-ray  IMPRESSION: 1. 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 2. Osteophytes involving the SI joints bilaterally.  PATIENT SURVEYS:  Oswestry Disability Index: 68%  SCREENING FOR RED FLAGS: Bowel or bladder incontinence: 0No Spinal tumors: No Cauda equina syndrome: No Compression fracture: No Abdominal aneurysm: No  COGNITION: Overall cognitive status: Within functional limits for tasks assessed     SENSATION: Patient reports no numbness in the past month.   POSTURE: rounded shoulders, forward head, and flexed trunk   PALPATION: Familiar pain reproduced with palpation to bilateral lumbar  paraspinals, right QL, and obliques  LUMBAR ROM: Limited by familiar pain with all lumbar AROM   AROM eval  Flexion 22  Extension 6  Right lateral flexion 16  Left lateral flexion 8  Right rotation 75% limited  Left rotation 75% limited    (Blank rows = not tested)  LOWER EXTREMITY ROM:  unable to be assessed due to pain severity and irritability  LOWER EXTREMITY MMT:  unable to be assessed due to pain severity and irritability  LUMBAR SPECIAL TESTS:  Unable to be assessed due to pain severity and irritability 000 FUNCTIONAL TESTS:  Sit to stand transfers: slow and painful  GAIT: Assistive device utilized: None Level of assistance: Complete Independence Comments: trunk flexion, decreased stride length and gait speed  TODAY'S TREATMENT:                                                                                                                              DATE:     PATIENT EDUCATION:  Education details: Plan of care, prognosis, anatomy, referred pain, and goals for therapy Person educated: Patient Education method: Explanation Education comprehension: verbalized understanding  HOME EXERCISE PROGRAM:   ASSESSMENT:  CLINICAL IMPRESSION: Patient is a 57 y.o. male who was seen today for physical therapy evaluation and treatment for chronic low back pain with an acute flare up.  He presented with high pain severity and irritability with lumbar active range of motion being the most aggravating to his familiar symptoms.  This high pain severity  and irritability limited his ability to perform other objective testing such as manual muscle testing.  Recommend that he continue with skilled physical therapy to address his impairments to maximize his functional mobility.  OBJECTIVE IMPAIRMENTS: Abnormal gait, decreased activity tolerance, decreased balance, decreased knowledge of condition, decreased mobility, difficulty walking, decreased ROM, decreased strength, hypomobility,  impaired tone, postural dysfunction, and pain.   ACTIVITY LIMITATIONS: carrying, lifting, bending, sitting, standing, squatting, sleeping, stairs, transfers, bed mobility, bathing, dressing, hygiene/grooming, and locomotion level  PARTICIPATION LIMITATIONS: meal prep, cleaning, laundry, driving, shopping, and community activity  PERSONAL FACTORS: Behavior pattern, Past/current experiences, Time since onset of injury/illness/exacerbation, and 3+ comorbidities: Anxiety, depression, arthritis, and left knee pain  are also affecting patient's functional outcome.   REHAB POTENTIAL: Fair    CLINICAL DECISION MAKING: Unstable/unpredictable  EVALUATION COMPLEXITY: High   GOALS: Goals reviewed with patient? Yes  LONG TERM GOALS: Target date: 03/14/23  Patient will be independent with his home exercise program. Baseline:  Goal status: INITIAL  2.  Patient will be able to complete his daily activities without his familiar pain exceeding 8/10. Baseline:  Goal status: INITIAL  3.  Patient will improve his ODI score to 58% or less for improved functional mobility. Baseline:  Goal status: INITIAL  4.  Patient will be able to demonstrate at least 30 degrees of active lumbar flexion for improved function picking up items from the floor. Baseline:  Goal status: INITIAL  PLAN:  PT FREQUENCY: 2x/week  PT DURATION: 3 weeks  PLANNED INTERVENTIONS: Therapeutic exercises, Therapeutic activity, Neuromuscular re-education, Balance training, Gait training, Patient/Family education, Self Care, Joint mobilization, Stair training, Electrical stimulation, Spinal mobilization, Cryotherapy, Moist heat, Ultrasound, Manual therapy, and Re-evaluation.  PLAN FOR NEXT SESSION: NuStep, isometrics, manual therapy, and modalities as needed   Granville Lewis, PT 02/21/2023, 3:46 PM

## 2023-02-27 ENCOUNTER — Encounter: Payer: Self-pay | Admitting: *Deleted

## 2023-02-27 ENCOUNTER — Ambulatory Visit: Payer: Medicaid Other | Admitting: *Deleted

## 2023-02-27 DIAGNOSIS — M5459 Other low back pain: Secondary | ICD-10-CM

## 2023-02-27 DIAGNOSIS — G8929 Other chronic pain: Secondary | ICD-10-CM

## 2023-02-27 NOTE — Therapy (Addendum)
OUTPATIENT PHYSICAL THERAPY THORACOLUMBAR EVALUATION   Patient Name: Jack [name] MRN: [id number] DOB:October 29, 1965, 57 y.o., male Today's Date: 02/27/2023  END OF SESSION:  PT End of Session - 02/27/23 1346     Visit Number 2    Number of Visits 6    Date for PT Re-Evaluation 03/23/23    PT Start Time 1345    PT Stop Time 1436    PT Time Calculation (min) 51 min             Past Medical History:  Diagnosis Date   Anxiety    Arthritis    GERD (gastroesophageal reflux disease)    Headache    occasional    History of kidney stones    Kidney stones [id number]   Past Surgical History:  Procedure Laterality Date   EXTRACORPOREAL SHOCK WAVE LITHOTRIPSY Right 05/30/2018   Procedure: RIGHT EXTRACORPOREAL SHOCK WAVE LITHOTRIPSY (ESWL);  Surgeon: Crist Fat, MD;  Location: [facility];  Service: Urology;  Laterality: Right;   KNEE SURGERY Left    3 arthroscopic,  two ACL reconstuctions   SHOULDER SURGERY Right    dislocation repair with a pin   TONSILLECTOMY AND ADENOIDECTOMY     TOTAL KNEE ARTHROPLASTY Left 07/12/2015   Procedure: TOTAL LEFT  KNEE ARTHROPLASTY;  Surgeon: Ollen Gross, MD;  Location: [facility];  Service: Orthopedics;  Laterality: Left;   tubes in ears     Patient Active Problem List   Diagnosis Date Noted   History of total left knee replacement 10/22/2015   Vitamin D deficiency 04/15/2015    PCP: Raliegh Ip, DO  REFERRING PROVIDER: Windle Guard, MD  REFERRING DIAG: Vertebrogenic low back pain  Rationale for Evaluation and Treatment: Rehabilitation  THERAPY DIAG:  Other low back pain  Chronic pain of left knee  ONSET DATE: chronic pain w/ flare up 2 weeks ago   SUBJECTIVE:                                                                                                                                                                                           SUBJECTIVE STATEMENT: Patient reports that his back pain is about the same  7-8/10 PERTINENT HISTORY:  Anxiety, depression, arthritis, and left knee pain  PAIN:  Are you having pain? Yes: NPRS scale: 7-8/10 Pain location: low back and right leg  Pain description: sharp radiating pain, throbbing  Aggravating factors: everything causes pain  Relieving factors: medication (slightly)  PRECAUTIONS: Fall  WEIGHT BEARING RESTRICTIONS: No  FALLS:  Has patient fallen in last 6 months? Yes. Number of falls 2; while cutting wood and  getting tangled up in some limbs  LIVING ENVIRONMENT: Lives with: lives alone Lives in: House/apartment Stairs: Yes: External: 3 steps; none; step to pattern Has following equipment at home: None  OCCUPATION: not working  PLOF: Independent  PATIENT GOALS: get a MRI   NEXT MD VISIT: 03/28/23  OBJECTIVE:   DIAGNOSTIC FINDINGS: 02/05/23 lumbar x-ray  IMPRESSION: 1. 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 2. Osteophytes involving the SI joints bilaterally.  PATIENT SURVEYS:  Oswestry Disability Index: 68%  SCREENING FOR RED FLAGS: Bowel or bladder incontinence: 0No Spinal tumors: No Cauda equina syndrome: No Compression fracture: No Abdominal aneurysm: No  COGNITION: Overall cognitive status: Within functional limits for tasks assessed     SENSATION: Patient reports no numbness in the past month.   POSTURE: rounded shoulders, forward head, and flexed trunk   PALPATION: Familiar pain reproduced with palpation to bilateral lumbar paraspinals, right QL, and obliques  LUMBAR ROM: Limited by familiar pain with all lumbar AROM   AROM eval  Flexion 22  Extension 6  Right lateral flexion 16  Left lateral flexion 8  Right rotation 75% limited  Left rotation 75% limited    (Blank rows = not tested)  LOWER EXTREMITY ROM:  unable to be assessed due to pain severity and irritability  LOWER EXTREMITY MMT:  unable to be assessed due to pain severity and irritability  LUMBAR SPECIAL TESTS:  Unable to be  assessed due to pain severity and irritability 000 FUNCTIONAL TESTS:  Sit to stand transfers: slow and painful  GAIT: Assistive device utilized: None Level of assistance: Complete Independence Comments: trunk flexion, decreased stride length and gait speed  TODAY'S TREATMENT:                                                                                                                              DATE:                                                02-27-23                                    EXERCISE LOG  Exercise Repetitions and Resistance Comments  Nustep  x 10 mins L5   AB bracing X 10 hold 5 secs   Hook lying march X 6 hold 10 secs   Bridge X 10 hold 5 secs        Blank cell = exercise not performed today  Handout given for HEP IFC x15 mins 80-150hz   with HMP  hooklying   PATIENT EDUCATION:  Education details: Plan of care, prognosis, anatomy, referred pain, and goals for therapy Person educated: Patient Education method: Explanation Education comprehension: verbalized understanding  HOME EXERCISE PROGRAM:  ASSESSMENT:  CLINICAL IMPRESSION: Pt arrived today doing fair with LBP. Rx focused on AB bracing with transitional movements to decrease pain  as well as with core strengthening exs. Handout given for HEP. We also discussed movement patterns to decrease pain triggers. IFC end of session for pain control.    OBJECTIVE IMPAIRMENTS: Abnormal gait, decreased activity tolerance, decreased balance, decreased knowledge of condition, decreased mobility, difficulty walking, decreased ROM, decreased strength, hypomobility, impaired tone, postural dysfunction, and pain.   ACTIVITY LIMITATIONS: carrying, lifting, bending, sitting, standing, squatting, sleeping, stairs, transfers, bed mobility, bathing, dressing, hygiene/grooming, and locomotion level  PARTICIPATION LIMITATIONS: meal prep, cleaning, laundry, driving, shopping, and community activity  PERSONAL FACTORS:  Behavior pattern, Past/current experiences, Time since onset of injury/illness/exacerbation, and 3+ comorbidities: Anxiety, depression, arthritis, and left knee pain  are also affecting patient's functional outcome.   REHAB POTENTIAL: Fair    CLINICAL DECISION MAKING: Unstable/unpredictable  EVALUATION COMPLEXITY: High   GOALS: Goals reviewed with patient? Yes  LONG TERM GOALS: Target date: 03/14/23  Patient will be independent with his home exercise program. Baseline:  Goal status: INITIAL  2.  Patient will be able to complete his daily activities without his familiar pain exceeding 8/10. Baseline:  Goal status: INITIAL  3.  Patient will improve his ODI score to 58% or less for improved functional mobility. Baseline:  Goal status: INITIAL  4.  Patient will be able to demonstrate at least 30 degrees of active lumbar flexion for improved function picking up items from the floor. Baseline:  Goal status: INITIAL  PLAN:  PT FREQUENCY: 2x/week  PT DURATION: 3 weeks  PLANNED INTERVENTIONS: Therapeutic exercises, Therapeutic activity, Neuromuscular re-education, Balance training, Gait training, Patient/Family education, Self Care, Joint mobilization, Stair training, Electrical stimulation, Spinal mobilization, Cryotherapy, Moist heat, Ultrasound, Manual therapy, and Re-evaluation.  PLAN FOR NEXT SESSION: NuStep, isometrics, manual therapy, and modalities as needed   [place],CHRIS, PTA 02/27/2023, 2:43 PM

## 2023-03-01 ENCOUNTER — Ambulatory Visit: Payer: Medicaid Other | Admitting: *Deleted

## 2023-03-01 ENCOUNTER — Encounter: Payer: Self-pay | Admitting: *Deleted

## 2023-03-01 ENCOUNTER — Telehealth: Payer: Self-pay | Admitting: *Deleted

## 2023-03-01 DIAGNOSIS — M5459 Other low back pain: Secondary | ICD-10-CM

## 2023-03-01 DIAGNOSIS — G8929 Other chronic pain: Secondary | ICD-10-CM

## 2023-03-01 NOTE — Telephone Encounter (Signed)
  Procedure: Colonoscopy  Height: 5'7.5 Weight: 195lbs       Have you had a colonoscopy before?  2000  Do you have family history of colon cancer?  Aunt  Do you have a family history of polyps? yes  Previous colonoscopy with polyps removed? 2000  Do you have a history colorectal cancer?   no  Are you diabetic?  no  Do you have a prosthetic or mechanical heart valve? no  Do you have a pacemaker/defibrillator?   no  Have you had endocarditis/atrial fibrillation?  no  Do you use supplemental oxygen/CPAP?  no  Have you had joint replacement within the last 12 months?  no  Do you tend to be constipated or have to use laxatives?  no   Do you have history of alcohol use? If yes, how much and how often.  None now. Prior use to be 1 pint per week if that  Do you have history or are you using drugs? If yes, what do are you  using?  no  Have you ever had a stroke/heart attack?  no  Have you ever had a heart or other vascular stent placed,?no  Do you take weight loss medication? no  Do you take any blood-thinning medications such as: (Plavix, aspirin, Coumadin, Aggrenox, Brilinta, Xarelto, Eliquis, Pradaxa, Savaysa or Effient)? no  If yes we need the name, milligram, dosage and who is prescribing doctor:               Current Outpatient Medications  Medication Sig Dispense Refill   celecoxib (CELEBREX) 100 MG capsule      DULoxetine (CYMBALTA) 60 MG capsule Take 1 capsule (60 mg total) by mouth daily. (Patient taking differently: Take 30 mg by mouth daily.) 90 capsule 3   IBUPROFEN PO Take by mouth. As needed     methocarbamol (ROBAXIN) 500 MG tablet Take 1 tablet (500 mg total) by mouth 2 (two) times daily as needed for muscle spasms. 30 tablet 2   No current facility-administered medications for this visit.    Allergies  Allergen Reactions   Tramadol Nausea And Vomiting

## 2023-03-01 NOTE — Therapy (Signed)
OUTPATIENT PHYSICAL THERAPY THORACOLUMBAR EVALUATION   Patient Name: Jack [name] MRN: [id number] DOB:Mar 01, 1966, 57 y.o., male Today's Date: 03/01/2023  END OF SESSION:  PT End of Session - 03/01/23 1354     Visit Number 3    Number of Visits 6    Date for PT Re-Evaluation 03/23/23    PT Start Time 1345    PT Stop Time 1435    PT Time Calculation (min) 50 min             Past Medical History:  Diagnosis Date   Anxiety    Arthritis    GERD (gastroesophageal reflux disease)    Headache    occasional    History of kidney stones    Kidney stones [id number]   Past Surgical History:  Procedure Laterality Date   EXTRACORPOREAL SHOCK WAVE LITHOTRIPSY Right 05/30/2018   Procedure: RIGHT EXTRACORPOREAL SHOCK WAVE LITHOTRIPSY (ESWL);  Surgeon: Crist Fat, MD;  Location: [facility];  Service: Urology;  Laterality: Right;   KNEE SURGERY Left    3 arthroscopic,  two ACL reconstuctions   SHOULDER SURGERY Right    dislocation repair with a pin   TONSILLECTOMY AND ADENOIDECTOMY     TOTAL KNEE ARTHROPLASTY Left 07/12/2015   Procedure: TOTAL LEFT  KNEE ARTHROPLASTY;  Surgeon: Ollen Gross, MD;  Location: [facility];  Service: Orthopedics;  Laterality: Left;   tubes in ears     Patient Active Problem List   Diagnosis Date Noted   History of total left knee replacement 10/22/2015   Vitamin D deficiency 04/15/2015    PCP: Raliegh Ip, DO  REFERRING PROVIDER: Windle Guard, MD  REFERRING DIAG: Vertebrogenic low back pain  Rationale for Evaluation and Treatment: Rehabilitation  THERAPY DIAG:  Other low back pain  Chronic pain of left knee  ONSET DATE: chronic pain w/ flare up 2 weeks ago   SUBJECTIVE:                                                                                                                                                                                           SUBJECTIVE STATEMENT: Patient reports that he did okay after last Rx    6-7/10 PERTINENT HISTORY:  Anxiety, depression, arthritis, and left knee pain  PAIN:  Are you having pain? Yes: NPRS scale: 6-7/10 Pain location: low back and right leg  Pain description: sharp radiating pain, throbbing  Aggravating factors: everything causes pain  Relieving factors: medication (slightly)  PRECAUTIONS: Fall  WEIGHT BEARING RESTRICTIONS: No  FALLS:  Has patient fallen in last 6 months? Yes. Number of falls 2; while cutting wood  and getting tangled up in some limbs  LIVING ENVIRONMENT: Lives with: lives alone Lives in: House/apartment Stairs: Yes: External: 3 steps; none; step to pattern Has following equipment at home: None  OCCUPATION: not working  PLOF: Independent  PATIENT GOALS: get a MRI   NEXT MD VISIT: 03/28/23  OBJECTIVE:   DIAGNOSTIC FINDINGS: 02/05/23 lumbar x-ray  IMPRESSION: 1. 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 2. Osteophytes involving the SI joints bilaterally.  PATIENT SURVEYS:  Oswestry Disability Index: 68%  SCREENING FOR RED FLAGS: Bowel or bladder incontinence: 0No Spinal tumors: No Cauda equina syndrome: No Compression fracture: No Abdominal aneurysm: No  COGNITION: Overall cognitive status: Within functional limits for tasks assessed     SENSATION: Patient reports no numbness in the past month.   POSTURE: rounded shoulders, forward head, and flexed trunk   PALPATION: Familiar pain reproduced with palpation to bilateral lumbar paraspinals, right QL, and obliques  LUMBAR ROM: Limited by familiar pain with all lumbar AROM   AROM eval  Flexion 22  Extension 6  Right lateral flexion 16  Left lateral flexion 8  Right rotation 75% limited  Left rotation 75% limited    (Blank rows = not tested)  LOWER EXTREMITY ROM:  unable to be assessed due to pain severity and irritability  LOWER EXTREMITY MMT:  unable to be assessed due to pain severity and irritability  LUMBAR SPECIAL TESTS:  Unable to be  assessed due to pain severity and irritability 000 FUNCTIONAL TESTS:  Sit to stand transfers: slow and painful  GAIT: Assistive device utilized: None Level of assistance: Complete Independence Comments: trunk flexion, decreased stride length and gait speed  TODAY'S TREATMENT:                                                                                                                              DATE:                                                03-01-23                                    EXERCISE LOG  Exercise Repetitions and Resistance Comments  Nustep  x 12 mins L5   AB bracing X 10 hold 5 secs   Hook lying march X 6 hold 10 secs   Bridge X 10 hold 5 secs        Blank cell = exercise not performed today  Handout given for HEP IFC x15 mins, 80-150hz  to LB paras in hooklying Discussed ADL's and movement patterns   PATIENT EDUCATION:  Education details: Plan of care, prognosis, anatomy, referred pain, and goals for therapy Person educated: Patient Education method: Explanation Education comprehension: verbalized understanding  HOME EXERCISE PROGRAM:   ASSESSMENT:  CLINICAL IMPRESSION: Pt arrived today doing fair with LBP and doing okay after last Rx.  We reviewed AB bracing with transitional movements to decrease pain  as well as with core strengthening exs with verbal cue for technique needed.  IFC e and HMP end of session for pain control.    OBJECTIVE IMPAIRMENTS: Abnormal gait, decreased activity tolerance, decreased balance, decreased knowledge of condition, decreased mobility, difficulty walking, decreased ROM, decreased strength, hypomobility, impaired tone, postural dysfunction, and pain.   ACTIVITY LIMITATIONS: carrying, lifting, bending, sitting, standing, squatting, sleeping, stairs, transfers, bed mobility, bathing, dressing, hygiene/grooming, and locomotion level  PARTICIPATION LIMITATIONS: meal prep, cleaning, laundry, driving, shopping, and community  activity  PERSONAL FACTORS: Behavior pattern, Past/current experiences, Time since onset of injury/illness/exacerbation, and 3+ comorbidities: Anxiety, depression, arthritis, and left knee pain  are also affecting patient's functional outcome.   REHAB POTENTIAL: Fair    CLINICAL DECISION MAKING: Unstable/unpredictable  EVALUATION COMPLEXITY: High   GOALS: Goals reviewed with patient? Yes  LONG TERM GOALS: Target date: 03/14/23  Patient will be independent with his home exercise program. Baseline:  Goal status: INITIAL  2.  Patient will be able to complete his daily activities without his familiar pain exceeding 8/10. Baseline:  Goal status: INITIAL  3.  Patient will improve his ODI score to 58% or less for improved functional mobility. Baseline:  Goal status: INITIAL  4.  Patient will be able to demonstrate at least 30 degrees of active lumbar flexion for improved function picking up items from the floor. Baseline:  Goal status: INITIAL  PLAN:  PT FREQUENCY: 2x/week  PT DURATION: 3 weeks  PLANNED INTERVENTIONS: Therapeutic exercises, Therapeutic activity, Neuromuscular re-education, Balance training, Gait training, Patient/Family education, Self Care, Joint mobilization, Stair training, Electrical stimulation, Spinal mobilization, Cryotherapy, Moist heat, Ultrasound, Manual therapy, and Re-evaluation.  PLAN FOR NEXT SESSION: NuStep, isometrics, manual therapy, and modalities as needed   Trinton Prewitt,CHRIS, PTA 03/01/2023, 3:34 PM

## 2023-03-07 ENCOUNTER — Ambulatory Visit: Payer: Medicaid Other

## 2023-03-07 DIAGNOSIS — M5459 Other low back pain: Secondary | ICD-10-CM

## 2023-03-07 NOTE — Therapy (Signed)
OUTPATIENT PHYSICAL THERAPY THORACOLUMBAR TREATMENT   Patient Name: Jack [name] MRN: [id number] DOB:07/23/1966, 57 y.o., male Today's Date: 03/07/2023  END OF SESSION:  PT End of Session - 03/07/23 0947     Visit Number 4    Number of Visits 6    Date for PT Re-Evaluation 03/23/23    PT Start Time 0947    PT Stop Time 1035    PT Time Calculation (min) 48 min    Activity Tolerance Patient limited by pain    Behavior During Therapy [facility] for tasks assessed/performed             Past Medical History:  Diagnosis Date   Anxiety    Arthritis    GERD (gastroesophageal reflux disease)    Headache    occasional    History of kidney stones    Kidney stones [id number]   Past Surgical History:  Procedure Laterality Date   EXTRACORPOREAL SHOCK WAVE LITHOTRIPSY Right 05/30/2018   Procedure: RIGHT EXTRACORPOREAL SHOCK WAVE LITHOTRIPSY (ESWL);  Surgeon: Crist Fat, MD;  Location: [facility];  Service: Urology;  Laterality: Right;   KNEE SURGERY Left    3 arthroscopic,  two ACL reconstuctions   SHOULDER SURGERY Right    dislocation repair with a pin   TONSILLECTOMY AND ADENOIDECTOMY     TOTAL KNEE ARTHROPLASTY Left 07/12/2015   Procedure: TOTAL LEFT  KNEE ARTHROPLASTY;  Surgeon: Ollen Gross, MD;  Location: [facility];  Service: Orthopedics;  Laterality: Left;   tubes in ears     Patient Active Problem List   Diagnosis Date Noted   History of total left knee replacement 10/22/2015   Vitamin D deficiency 04/15/2015    PCP: Raliegh Ip, DO  REFERRING PROVIDER: Windle Guard, MD  REFERRING DIAG: Vertebrogenic low back pain  Rationale for Evaluation and Treatment: Rehabilitation  THERAPY DIAG:  Other low back pain  ONSET DATE: chronic pain w/ flare up 2 weeks ago   SUBJECTIVE:                                                                                                                                                                                            SUBJECTIVE STATEMENT: Patient reports that he feels like he has gotten worse. He notes that he has not done anything strenuous, but today is the first day it has been like this. He felt pretty good after his last appointment.   PERTINENT HISTORY:  Anxiety, depression, arthritis, and left knee pain  PAIN:  Are you having pain? Yes: NPRS scale: 7/10 Pain location: low back and right leg  Pain description:  sharp radiating pain, throbbing  Aggravating factors: everything causes pain  Relieving factors: medication (slightly)  PRECAUTIONS: Fall  WEIGHT BEARING RESTRICTIONS: No  FALLS:  Has patient fallen in last 6 months? Yes. Number of falls 2; while cutting wood and getting tangled up in some limbs  LIVING ENVIRONMENT: Lives with: lives alone Lives in: House/apartment Stairs: Yes: External: 3 steps; none; step to pattern Has following equipment at home: None  OCCUPATION: not working  PLOF: Independent  PATIENT GOALS: get a MRI   NEXT MD VISIT: 03/28/23  OBJECTIVE:   DIAGNOSTIC FINDINGS: 02/05/23 lumbar x-ray  IMPRESSION: 1. 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 2. Osteophytes involving the SI joints bilaterally.  PATIENT SURVEYS:  Oswestry Disability Index: 68%  SCREENING FOR RED FLAGS: Bowel or bladder incontinence: 0No Spinal tumors: No Cauda equina syndrome: No Compression fracture: No Abdominal aneurysm: No  COGNITION: Overall cognitive status: Within functional limits for tasks assessed     SENSATION: Patient reports no numbness in the past month.   POSTURE: rounded shoulders, forward head, and flexed trunk   PALPATION: Familiar pain reproduced with palpation to bilateral lumbar paraspinals, right QL, and obliques  LUMBAR ROM: Limited by familiar pain with all lumbar AROM   AROM eval  Flexion 22  Extension 6  Right lateral flexion 16  Left lateral flexion 8  Right rotation 75% limited  Left rotation 75% limited    (Blank rows  = not tested)  LOWER EXTREMITY ROM:  unable to be assessed due to pain severity and irritability  LOWER EXTREMITY MMT:  unable to be assessed due to pain severity and irritability  LUMBAR SPECIAL TESTS:  Unable to be assessed due to pain severity and irritability 000 FUNCTIONAL TESTS:  Sit to stand transfers: slow and painful  GAIT: Assistive device utilized: None Level of assistance: Complete Independence Comments: trunk flexion, decreased stride length and gait speed  TODAY'S TREATMENT:                                                                                                                              DATE:                                                                                   03/07/23 EXERCISE LOG  Exercise Repetitions and Resistance Comments  Nustep  L4 x 12 minutes    Isometric ball press 3 minutes w/ 5 second hold                Blank cell = exercise not performed today Manual Therapy Soft Tissue Mobilization: bilateral lumbar paraspinals, for reduced pain and tone  Modalities: no redness or adverse reaction to today's modalities  Date:  Unattended Estim: Lumbar, IFC @ 80-150 Hz w/ 40% scan, 15 mins, Pain and Tone Hot Pack: Lumbar, 15 mins, Pain and Tone                                     03-01-23 EXERCISE LOG  Exercise Repetitions and Resistance Comments  Nustep  x 12 mins L5   AB bracing X 10 hold 5 secs   Hook lying march X 6 hold 10 secs   Bridge X 10 hold 5 secs        Blank cell = exercise not performed today  Handout given for HEP IFC x15 mins, 80-150hz  to LB paras in hooklying Discussed ADL's and movement patterns   PATIENT EDUCATION:  Education details: Plan of care, prognosis, anatomy, referred pain, and goals for therapy Person educated: Patient Education method: Explanation Education comprehension: verbalized understanding  HOME EXERCISE PROGRAM:   ASSESSMENT:  CLINICAL IMPRESSION: Patient presented to treatment with  elevated right low back pain. Treatment focused on familiar interventions for reduced low back pain. Manual therapy focused on soft tissue mobilization to his lumbar paraspinals, but this was unable to significantly reduce his familiar symptoms. He reported that his back was still hurting upon the conclusion of treatment. He continues to require skilled physical therapy to address his remaining impairments to maximize his functional mobility.   OBJECTIVE IMPAIRMENTS: Abnormal gait, decreased activity tolerance, decreased balance, decreased knowledge of condition, decreased mobility, difficulty walking, decreased ROM, decreased strength, hypomobility, impaired tone, postural dysfunction, and pain.   ACTIVITY LIMITATIONS: carrying, lifting, bending, sitting, standing, squatting, sleeping, stairs, transfers, bed mobility, bathing, dressing, hygiene/grooming, and locomotion level  PARTICIPATION LIMITATIONS: meal prep, cleaning, laundry, driving, shopping, and community activity  PERSONAL FACTORS: Behavior pattern, Past/current experiences, Time since onset of injury/illness/exacerbation, and 3+ comorbidities: Anxiety, depression, arthritis, and left knee pain  are also affecting patient's functional outcome.   REHAB POTENTIAL: Fair    CLINICAL DECISION MAKING: Unstable/unpredictable  EVALUATION COMPLEXITY: High   GOALS: Goals reviewed with patient? Yes  LONG TERM GOALS: Target date: 03/14/23  Patient will be independent with his home exercise program. Baseline:  Goal status: INITIAL  2.  Patient will be able to complete his daily activities without his familiar pain exceeding 8/10. Baseline:  Goal status: INITIAL  3.  Patient will improve his ODI score to 58% or less for improved functional mobility. Baseline:  Goal status: INITIAL  4.  Patient will be able to demonstrate at least 30 degrees of active lumbar flexion for improved function picking up items from the floor. Baseline:  Goal  status: INITIAL  PLAN:  PT FREQUENCY: 2x/week  PT DURATION: 3 weeks  PLANNED INTERVENTIONS: Therapeutic exercises, Therapeutic activity, Neuromuscular re-education, Balance training, Gait training, Patient/Family education, Self Care, Joint mobilization, Stair training, Electrical stimulation, Spinal mobilization, Cryotherapy, Moist heat, Ultrasound, Manual therapy, and Re-evaluation.  PLAN FOR NEXT SESSION: NuStep, isometrics, manual therapy, and modalities as needed   Granville Lewis, PT 03/07/2023, 12:23 PM

## 2023-03-13 ENCOUNTER — Ambulatory Visit: Payer: Medicaid Other | Attending: Anesthesiology | Admitting: Physical Therapy

## 2023-03-13 DIAGNOSIS — M5459 Other low back pain: Secondary | ICD-10-CM | POA: Diagnosis present

## 2023-03-13 NOTE — Therapy (Signed)
OUTPATIENT PHYSICAL THERAPY THORACOLUMBAR TREATMENT   Patient Name: Jack [name] MRN: [id number] DOB:08-05-1966, 57 y.o., male Today's Date: 03/13/2023  END OF SESSION:  PT End of Session - 03/13/23 1304     Visit Number 5    Number of Visits 6    Date for PT Re-Evaluation 03/23/23    PT Start Time 0100    PT Stop Time 0143    PT Time Calculation (min) 43 min    Activity Tolerance Patient limited by pain    Behavior During Therapy [facility] for tasks assessed/performed             Past Medical History:  Diagnosis Date   Anxiety    Arthritis    GERD (gastroesophageal reflux disease)    Headache    occasional    History of kidney stones    Kidney stones [id number]   Past Surgical History:  Procedure Laterality Date   EXTRACORPOREAL SHOCK WAVE LITHOTRIPSY Right 05/30/2018   Procedure: RIGHT EXTRACORPOREAL SHOCK WAVE LITHOTRIPSY (ESWL);  Surgeon: Crist Fat, MD;  Location: [facility];  Service: Urology;  Laterality: Right;   KNEE SURGERY Left    3 arthroscopic,  two ACL reconstuctions   SHOULDER SURGERY Right    dislocation repair with a pin   TONSILLECTOMY AND ADENOIDECTOMY     TOTAL KNEE ARTHROPLASTY Left 07/12/2015   Procedure: TOTAL LEFT  KNEE ARTHROPLASTY;  Surgeon: Ollen Gross, MD;  Location: [facility];  Service: Orthopedics;  Laterality: Left;   tubes in ears     Patient Active Problem List   Diagnosis Date Noted   History of total left knee replacement 10/22/2015   Vitamin D deficiency 04/15/2015    PCP: Raliegh Ip, DO  REFERRING PROVIDER: Windle Guard, MD  REFERRING DIAG: Vertebrogenic low back pain  Rationale for Evaluation and Treatment: Rehabilitation  THERAPY DIAG:  Other low back pain  ONSET DATE: chronic pain w/ flare up 2 weeks ago   SUBJECTIVE:                                                                                                                                                                                            SUBJECTIVE STATEMENT: Pain at a 5.  PERTINENT HISTORY:  Anxiety, depression, arthritis, and left knee pain  PAIN:  Are you having pain? Yes: NPRS scale: 5/10 Pain location: low back and right leg  Pain description: sharp radiating pain, throbbing  Aggravating factors: everything causes pain  Relieving factors: medication (slightly)  PRECAUTIONS: Fall  WEIGHT BEARING RESTRICTIONS: No  FALLS:  Has patient fallen in last 6 months? Yes. Number  of falls 2; while cutting wood and getting tangled up in some limbs  LIVING ENVIRONMENT: Lives with: lives alone Lives in: House/apartment Stairs: Yes: External: 3 steps; none; step to pattern Has following equipment at home: None  OCCUPATION: not working  PLOF: Independent  PATIENT GOALS: get a MRI   NEXT MD VISIT: 03/28/23  OBJECTIVE:   DIAGNOSTIC FINDINGS: 02/05/23 lumbar x-ray  IMPRESSION: 1. 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 2. Osteophytes involving the SI joints bilaterally.  PATIENT SURVEYS:  Oswestry Disability Index: 68%  SCREENING FOR RED FLAGS: Bowel or bladder incontinence: 0No Spinal tumors: No Cauda equina syndrome: No Compression fracture: No Abdominal aneurysm: No  COGNITION: Overall cognitive status: Within functional limits for tasks assessed     SENSATION: Patient reports no numbness in the past month.   POSTURE: rounded shoulders, forward head, and flexed trunk   PALPATION: Familiar pain reproduced with palpation to bilateral lumbar paraspinals, right QL, and obliques  LUMBAR ROM: Limited by familiar pain with all lumbar AROM   AROM eval  Flexion 22  Extension 6  Right lateral flexion 16  Left lateral flexion 8  Right rotation 75% limited  Left rotation 75% limited    (Blank rows = not tested)  LOWER EXTREMITY ROM:  unable to be assessed due to pain severity and irritability  LOWER EXTREMITY MMT:  unable to be assessed due to pain severity and  irritability  LUMBAR SPECIAL TESTS:  Unable to be assessed due to pain severity and irritability 000 FUNCTIONAL TESTS:  Sit to stand transfers: slow and painful  GAIT: Assistive device utilized: None Level of assistance: Complete Independence Comments: trunk flexion, decreased stride length and gait speed  TODAY'S TREATMENT:                                                                                                                              DATE:                                                                                   03/12/23 EXERCISE LOG  Exercise Repetitions and Resistance Comments  Nustep  L4 x 15 minutes    Left sdly position with follded pillow between knees for comfort:  Performed STW/M x 8 minutes to patient's right low back musculature with ischemic release technique to right QL.  Date:  Unattended Estim: Lumbar, IFC @ 80-150 Hz w/ 40% scan, 15 mins, Pain and Tone Hot Pack: Lumbar, 15 mins, Pain and Tone       PATIENT EDUCATION:  Education details: Plan of care, prognosis, anatomy, referred pain, and goals for therapy Person educated:  Patient Education method: Explanation Education comprehension: verbalized understanding  HOME EXERCISE PROGRAM:   ASSESSMENT:  CLINICAL IMPRESSION: Patient with notable trigger point in right QL.  He tolerated STW/M well.  Patient tolerated treatment without complaint with normal modality response following removal of modality.  OBJECTIVE IMPAIRMENTS: Abnormal gait, decreased activity tolerance, decreased balance, decreased knowledge of condition, decreased mobility, difficulty walking, decreased ROM, decreased strength, hypomobility, impaired tone, postural dysfunction, and pain.   ACTIVITY LIMITATIONS: carrying, lifting, bending, sitting, standing, squatting, sleeping, stairs, transfers, bed mobility, bathing, dressing, hygiene/grooming, and locomotion level  PARTICIPATION LIMITATIONS: meal prep, cleaning, laundry,  driving, shopping, and community activity  PERSONAL FACTORS: Behavior pattern, Past/current experiences, Time since onset of injury/illness/exacerbation, and 3+ comorbidities: Anxiety, depression, arthritis, and left knee pain  are also affecting patient's functional outcome.   REHAB POTENTIAL: Fair    CLINICAL DECISION MAKING: Unstable/unpredictable  EVALUATION COMPLEXITY: High   GOALS: Goals reviewed with patient? Yes  LONG TERM GOALS: Target date: 03/14/23  Patient will be independent with his home exercise program. Baseline:  Goal status: INITIAL  2.  Patient will be able to complete his daily activities without his familiar pain exceeding 8/10. Baseline:  Goal status: INITIAL  3.  Patient will improve his ODI score to 58% or less for improved functional mobility. Baseline:  Goal status: INITIAL  4.  Patient will be able to demonstrate at least 30 degrees of active lumbar flexion for improved function picking up items from the floor. Baseline:  Goal status: INITIAL  PLAN:  PT FREQUENCY: 2x/week  PT DURATION: 3 weeks  PLANNED INTERVENTIONS: Therapeutic exercises, Therapeutic activity, Neuromuscular re-education, Balance training, Gait training, Patient/Family education, Self Care, Joint mobilization, Stair training, Electrical stimulation, Spinal mobilization, Cryotherapy, Moist heat, Ultrasound, Manual therapy, and Re-evaluation.  PLAN FOR NEXT SESSION: NuStep, isometrics, manual therapy, and modalities as needed   Carman Essick, [place], PT 03/13/2023, 2:15 PM

## 2023-03-15 ENCOUNTER — Ambulatory Visit: Payer: Medicaid Other

## 2023-03-15 DIAGNOSIS — M5459 Other low back pain: Secondary | ICD-10-CM

## 2023-03-15 NOTE — Therapy (Signed)
OUTPATIENT PHYSICAL THERAPY THORACOLUMBAR TREATMENT   Patient Name: Jack [name] MRN: [id number] DOB:09-19-1966, 57 y.o., male Today's Date: 03/15/2023  END OF SESSION:  PT End of Session - 03/15/23 1304     Visit Number 6    Number of Visits 6    Date for PT Re-Evaluation 03/23/23    PT Start Time 1300    PT Stop Time 1345    PT Time Calculation (min) 45 min    Activity Tolerance Patient limited by pain    Behavior During Therapy [facility] for tasks assessed/performed             Past Medical History:  Diagnosis Date   Anxiety    Arthritis    GERD (gastroesophageal reflux disease)    Headache    occasional    History of kidney stones    Kidney stones [id number]   Past Surgical History:  Procedure Laterality Date   EXTRACORPOREAL SHOCK WAVE LITHOTRIPSY Right 05/30/2018   Procedure: RIGHT EXTRACORPOREAL SHOCK WAVE LITHOTRIPSY (ESWL);  Surgeon: Crist Fat, MD;  Location: [facility];  Service: Urology;  Laterality: Right;   KNEE SURGERY Left    3 arthroscopic,  two ACL reconstuctions   SHOULDER SURGERY Right    dislocation repair with a pin   TONSILLECTOMY AND ADENOIDECTOMY     TOTAL KNEE ARTHROPLASTY Left 07/12/2015   Procedure: TOTAL LEFT  KNEE ARTHROPLASTY;  Surgeon: Ollen Gross, MD;  Location: [facility];  Service: Orthopedics;  Laterality: Left;   tubes in ears     Patient Active Problem List   Diagnosis Date Noted   History of total left knee replacement 10/22/2015   Vitamin D deficiency 04/15/2015    PCP: Raliegh Ip, DO  REFERRING PROVIDER: Windle Guard, MD  REFERRING DIAG: Vertebrogenic low back pain  Rationale for Evaluation and Treatment: Rehabilitation  THERAPY DIAG:  Other low back pain  ONSET DATE: chronic pain w/ flare up 2 weeks ago   SUBJECTIVE:                                                                                                                                                                                            SUBJECTIVE STATEMENT: Patient reports that he feels about the same today. He notes that he was really sore after his last appointment.   PERTINENT HISTORY:  Anxiety, depression, arthritis, and left knee pain  PAIN:  Are you having pain? Yes: NPRS scale: 5/10 Pain location: low back and right leg  Pain description: sharp radiating pain, throbbing  Aggravating factors: everything causes pain  Relieving factors: medication (slightly)  PRECAUTIONS: Fall  WEIGHT BEARING RESTRICTIONS: No  FALLS:  Has patient fallen in last 6 months? Yes. Number of falls 2; while cutting wood and getting tangled up in some limbs  LIVING ENVIRONMENT: Lives with: lives alone Lives in: House/apartment Stairs: Yes: External: 3 steps; none; step to pattern Has following equipment at home: None  OCCUPATION: not working  PLOF: Independent  PATIENT GOALS: get a MRI   NEXT MD VISIT: 03/28/23  OBJECTIVE:   DIAGNOSTIC FINDINGS: 02/05/23 lumbar x-ray  IMPRESSION: 1. 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 2. Osteophytes involving the SI joints bilaterally.  PATIENT SURVEYS:  Oswestry Disability Index: 64% disability  SCREENING FOR RED FLAGS: Bowel or bladder incontinence: 0No Spinal tumors: No Cauda equina syndrome: No Compression fracture: No Abdominal aneurysm: No  COGNITION: Overall cognitive status: Within functional limits for tasks assessed     SENSATION: Patient reports no numbness in the past month.   POSTURE: rounded shoulders, forward head, and flexed trunk   PALPATION: Familiar pain reproduced with palpation to bilateral lumbar paraspinals, right QL, and obliques  LUMBAR ROM: Limited by familiar pain with all lumbar AROM   AROM eval 03/15/23  Flexion 22 62; painful   Extension 6 8; painful   Right lateral flexion 16 8; painful   Left lateral flexion 8 6; painful   Right rotation 75% limited 75% limited  Left rotation 75% limited  50% limited   (Blank rows  = not tested)  LOWER EXTREMITY ROM:  unable to be assessed due to pain severity and irritability  LOWER EXTREMITY MMT:  unable to be assessed due to pain severity and irritability  LUMBAR SPECIAL TESTS:  Unable to be assessed due to pain severity and irritability 000 FUNCTIONAL TESTS:  Sit to stand transfers: slow and painful  GAIT: Assistive device utilized: None Level of assistance: Complete Independence Comments: trunk flexion, decreased stride length and gait speed  TODAY'S TREATMENT:                                                                                                                              DATE:                                                                                   03/15/23 EXERCISE LOG  Exercise Repetitions and Resistance Comments  Nustep  L3 x 15  minutes    Blank cell = exercise not performed today  Modalities: no redness or adverse reaction to today's modalities  Date:  Unattended Estim: Lumbar, IFC @ 80-150 Hz w/ 40% scan, 15 mins, Pain and Tone Hot Pack: Lumbar, 15 mins, Pain and Tone  03/12/23 EXERCISE LOG  Exercise Repetitions and Resistance Comments  Nustep  L4 x 15 minutes    Left sdly position with follded pillow between knees for comfort:  Performed STW/M x 8 minutes to patient's right low back musculature with ischemic release technique to right QL.  Date:  Unattended Estim: Lumbar, IFC @ 80-150 Hz w/ 40% scan, 15 mins, Pain and Tone Hot Pack: Lumbar, 15 mins, Pain and Tone       PATIENT EDUCATION:  Education details: progress with therapy, beneftis of exercise Person educated: Patient Education method: Explanation Education comprehension: verbalized understanding  HOME EXERCISE PROGRAM:   ASSESSMENT:  CLINICAL IMPRESSION: Patient has made minimal progress with skilled physical therapy as evidenced by his subjective reports, objective measures, and progress toward his goals. He was able to  meet his goals for reduced pain and improved lumbar flexion. However, he continues to experience increased pain which limits his ability to complete his daily activities. He was educated on his progress with skilled physical therapy and reported feeling comfortable being discharged at this time. He may benefit from additional medical intervention due to his continued pain with functional activities.    PHYSICAL THERAPY DISCHARGE SUMMARY  Visits from Start of Care: 6  Current functional level related to goals / functional outcomes: Patient was able to partially meet his goals for skilled physical therapy. He continues to experience elevated pain levels with functional activities. He reported feeling comfortable being discharged at this time.    Remaining deficits: Pain and AROM   Education / Equipment: HEP and education   Patient agrees to discharge. Patient goals were partially met. Patient is being discharged due to maximized rehab potential.    OBJECTIVE IMPAIRMENTS: Abnormal gait, decreased activity tolerance, decreased balance, decreased knowledge of condition, decreased mobility, difficulty walking, decreased ROM, decreased strength, hypomobility, impaired tone, postural dysfunction, and pain.   ACTIVITY LIMITATIONS: carrying, lifting, bending, sitting, standing, squatting, sleeping, stairs, transfers, bed mobility, bathing, dressing, hygiene/grooming, and locomotion level  PARTICIPATION LIMITATIONS: meal prep, cleaning, laundry, driving, shopping, and community activity  PERSONAL FACTORS: Behavior pattern, Past/current experiences, Time since onset of injury/illness/exacerbation, and 3+ comorbidities: Anxiety, depression, arthritis, and left knee pain  are also affecting patient's functional outcome.   REHAB POTENTIAL: Fair    CLINICAL DECISION MAKING: Unstable/unpredictable  EVALUATION COMPLEXITY: High   GOALS: Goals reviewed with patient? Yes  LONG TERM GOALS: Target date:  03/14/23  Patient will be independent with his home exercise program. Baseline: "very little"  Goal status: NOT MET  2.  Patient will be able to complete his daily activities without his familiar pain exceeding 8/10. Baseline: typically stays between 5-6/10 Goal status: MET  3.  Patient will improve his ODI score to 58% or less for improved functional mobility. Baseline: 64% disability Goal status: NOT MET  4.  Patient will be able to demonstrate at least 30 degrees of active lumbar flexion for improved function picking up items from the floor. Baseline:  Goal status: MET  PLAN:  PT FREQUENCY: 2x/week  PT DURATION: 3 weeks  PLANNED INTERVENTIONS: Therapeutic exercises, Therapeutic activity, Neuromuscular re-education, Balance training, Gait training, Patient/Family education, Self Care, Joint mobilization, Stair training, Electrical stimulation, Spinal mobilization, Cryotherapy, Moist heat, Ultrasound, Manual therapy, and Re-evaluation.  PLAN FOR NEXT SESSION: NuStep, isometrics, manual therapy, and modalities as needed   Granville Lewis, PT 03/15/2023, 5:28 PM

## 2023-03-19 NOTE — Telephone Encounter (Signed)
ASA 3. Appropriate.  

## 2023-03-20 NOTE — Telephone Encounter (Signed)
LMOVM to call back to schedule with Dr. Carver 

## 2023-03-21 NOTE — Telephone Encounter (Signed)
LMOVM to call back. Letter mailed. ?

## 2023-03-27 ENCOUNTER — Encounter: Payer: Self-pay | Admitting: *Deleted

## 2023-03-27 ENCOUNTER — Other Ambulatory Visit: Payer: Self-pay | Admitting: *Deleted

## 2023-03-27 MED ORDER — PEG 3350-KCL-NA BICARB-NACL 420 G PO SOLR: 4000.0000 mL | Freq: Once | ORAL | 0 refills | Status: AC

## 2023-03-27 NOTE — Telephone Encounter (Signed)
Pt has been scheduled for 04/23/23 with Dr.Carver. instructions mailed and prep sent to the pharmacy

## 2023-03-28 DIAGNOSIS — M47816 Spondylosis without myelopathy or radiculopathy, lumbar region: Secondary | ICD-10-CM | POA: Diagnosis not present

## 2023-03-28 DIAGNOSIS — M5451 Vertebrogenic low back pain: Secondary | ICD-10-CM | POA: Diagnosis not present

## 2023-03-28 DIAGNOSIS — M5416 Radiculopathy, lumbar region: Secondary | ICD-10-CM | POA: Diagnosis not present

## 2023-04-04 NOTE — Telephone Encounter (Signed)
Referral completed

## 2023-04-08 DIAGNOSIS — M5459 Other low back pain: Secondary | ICD-10-CM | POA: Diagnosis not present

## 2023-04-13 DIAGNOSIS — M5459 Other low back pain: Secondary | ICD-10-CM | POA: Diagnosis not present

## 2023-04-13 DIAGNOSIS — M47816 Spondylosis without myelopathy or radiculopathy, lumbar region: Secondary | ICD-10-CM | POA: Diagnosis not present

## 2023-04-13 DIAGNOSIS — M47896 Other spondylosis, lumbar region: Secondary | ICD-10-CM | POA: Diagnosis not present

## 2023-04-13 DIAGNOSIS — M5451 Vertebrogenic low back pain: Secondary | ICD-10-CM | POA: Diagnosis not present

## 2023-04-13 DIAGNOSIS — M5416 Radiculopathy, lumbar region: Secondary | ICD-10-CM | POA: Diagnosis not present

## 2023-04-18 ENCOUNTER — Encounter: Payer: Self-pay | Admitting: Family Medicine

## 2023-04-18 ENCOUNTER — Ambulatory Visit: Payer: Medicaid Other | Admitting: Family Medicine

## 2023-04-18 VITALS — BP 105/77 | HR 69 | Temp 96.7°F | Ht 68.0 in | Wt 191.6 lb

## 2023-04-18 DIAGNOSIS — Z125 Encounter for screening for malignant neoplasm of prostate: Secondary | ICD-10-CM | POA: Diagnosis not present

## 2023-04-18 DIAGNOSIS — M4306 Spondylolysis, lumbar region: Secondary | ICD-10-CM | POA: Diagnosis not present

## 2023-04-18 DIAGNOSIS — R7309 Other abnormal glucose: Secondary | ICD-10-CM | POA: Diagnosis not present

## 2023-04-18 DIAGNOSIS — F430 Acute stress reaction: Secondary | ICD-10-CM | POA: Diagnosis not present

## 2023-04-18 DIAGNOSIS — F419 Anxiety disorder, unspecified: Secondary | ICD-10-CM | POA: Diagnosis not present

## 2023-04-18 DIAGNOSIS — E663 Overweight: Secondary | ICD-10-CM | POA: Diagnosis not present

## 2023-04-18 LAB — BAYER DCA HB A1C WAIVED: HB A1C (BAYER DCA - WAIVED): 5.4 % (ref 4.8–5.6)

## 2023-04-18 NOTE — Patient Instructions (Signed)
Jack [name] should call for appointment here for counseling. Good luck with your back!  You had labs performed today.  You will be contacted with the results of the labs once they are available, usually in the next 3 business days for routine lab work.  If you have an active my chart account, they will be released to your MyChart.  If you prefer to have these labs released to you via telephone, please let us know.

## 2023-04-18 NOTE — Progress Notes (Addendum)
Subjective: Jack [name] up back pain PCP: Raliegh Ip, DO [id number] [name] is a 57 y.o. male presenting to clinic today for:  1. Back pain Back pain is stable with Lyrica increased to 100 mg nightly and Zanaflex added to replace methocarbamol by spinal specialist recently.  He has been referred to Dr. Shon Baton and has an appointment on the 17th to discuss corticosteroid injection versus surgical intervention.  His MRI demonstrated quite a bit of degenerative changes throughout the spine including disc issues.  2.  Reactive depression, anxiety Continues to have some issues with his mood despite use of Cymbalta.  He does feel like it is somewhat helpful but agrees to counseling services at this time.  He often feels like he "cannot please everybody" and does not want to "be a door mat".   ROS: Per HPI  Allergies  Allergen Reactions   Tramadol Nausea And Vomiting   Past Medical History:  Diagnosis Date   Anxiety    Arthritis    GERD (gastroesophageal reflux disease)    Headache    occasional    History of kidney stones    Kidney stones [id number]    Current Outpatient Medications:    DULoxetine (CYMBALTA) 60 MG capsule, Take 1 capsule (60 mg total) by mouth daily. (Patient taking differently: Take 60 mg by mouth every evening.), Disp: 90 capsule, Rfl: 3   ibuprofen (ADVIL) 200 MG tablet, Take 400 mg by mouth every 6 (six) hours as needed for moderate pain or mild pain., Disp: , Rfl:    pregabalin (LYRICA) 100 MG capsule, Take 100 mg by mouth at bedtime., Disp: , Rfl:    tiZANidine (ZANAFLEX) 2 MG tablet, Take 2 mg by mouth daily., Disp: , Rfl:  Social History   Socioeconomic History   Marital status: Single    Spouse name: Not on file   Number of children: Not on file   Years of education: Not on file   Highest education level: Not on file  Occupational History   Not on file  Tobacco Use   Smoking status: Never   Smokeless tobacco: Former    Types: Production designer, theatre/television/film Use: Never used  Substance and Sexual Activity   Alcohol use: Not Currently    Alcohol/week: 0.0 standard drinks of alcohol    Comment: occasional    Drug use: No   Sexual activity: Not on file  Other Topics Concern   Not on file  Social History Narrative   Not on file   Social Determinants of Health   Financial Resource Strain: Not on file  Food Insecurity: Not on file  Transportation Needs: Not on file  Physical Activity: Not on file  Stress: Not on file  Social Connections: Not on file  Intimate Partner Violence: Not on file   Family History  Problem Relation Age of Onset   Arthritis Mother     Objective: Office vital signs reviewed. BP 105/77   Pulse 69   Temp (!) 96.7 F (35.9 C) (Temporal)   Ht 5\' 8"  (1.727 m)   Wt 191 lb 9.6 oz (86.9 kg)   SpO2 96%   BMI 29.13 kg/m   Physical Examination:  General: Awake, alert, well nourished, No acute distress HEENT: sclera white, MMM Cardio: regular rate and rhythm, S1S2 heard, no murmurs appreciated Pulm: clear to auscultation bilaterally, no wheezes, rhonchi or rales; normal work of breathing on room air GI: soft, non-tender, non-distended, bowel sounds present x4,  no hepatomegaly, no splenomegaly, no masses MSK: Ambulating independently with fairly regular gait and station. Psych: Thought process somewhat tangential but patient very pleasant, interactive     02/05/2023   12:55 PM 12/15/2022    2:27 PM 07/02/2019    8:53 AM  Depression screen PHQ 2/9  Decreased Interest 2 1 1   Down, Depressed, Hopeless 2 2 1   PHQ - 2 Score 4 3 2   Altered sleeping 1 1 1   Tired, decreased energy 2 1 1   Change in appetite 1 1   Feeling bad or failure about yourself  2 2 0  Trouble concentrating 1 1 1   Moving slowly or fidgety/restless 1 1 0  Suicidal thoughts 1 0   PHQ-9 Score 13 10 5   Difficult doing work/chores Somewhat difficult Somewhat difficult       02/05/2023   12:55 PM 12/15/2022    2:25 PM 07/02/2019     8:54 AM  GAD 7 : Generalized Anxiety Score  Nervous, Anxious, on Edge 2 2 1   Control/stop worrying 3 3 1   Worry too much - different things 2 3 1   Trouble relaxing 2 2 1   Restless 2 2 1   Easily annoyed or irritable 2 2 3   Afraid - awful might happen 1 1 0  Total GAD 7 Score 14 15 8   Anxiety Difficulty Somewhat difficult  Somewhat difficult     Assessment/ Plan: 57 y.o. male   Lumbar spondylolysis  Reaction, stress, acute - Plan: Ambulatory referral to Integrated Behavioral Health  Anxiety - Plan: Ambulatory referral to Integrated Behavioral Health  Overweight (BMI 25.0-29.9) - Plan: CMP14+EGFR, CBC, Lipid Panel, Bayer DCA Hb A1c Waived  Screening for malignant neoplasm of prostate - Plan: PSA  Continue to follow-up with spinal specialist as directed.  I have updated his medications to reflect current use  Referral to integrated behavioral services here at [facility] has been placed for counseling.  Continue Cymbalta 60 mg for now  Will check some labs today.  These are nonfasting as he had a granola bar this morning.  Patient and/or legal guardian verbally consented to [facility] services about presenting concerns and psychiatric consultation as appropriate.  The services will be billed as appropriate for the patient   Orders Placed This Encounter  Procedures   CMP14+EGFR   CBC   Lipid Panel   PSA   Bayer DCA Hb A1c Waived   Ambulatory referral to Integrated Behavioral Health    Referral Priority:   Routine    Referral Type:   Consultation    Referral Reason:   Specialty Services Required    Number of Visits Requested:   1   No orders of the defined types were placed in this encounter.    Raliegh Ip, DO [facility] [phone]

## 2023-04-19 ENCOUNTER — Encounter (HOSPITAL_COMMUNITY): Payer: Self-pay

## 2023-04-19 ENCOUNTER — Other Ambulatory Visit (HOSPITAL_COMMUNITY): Payer: Medicaid Other

## 2023-04-19 ENCOUNTER — Encounter (HOSPITAL_COMMUNITY)
Admission: RE | Admit: 2023-04-19 | Discharge: 2023-04-19 | Disposition: A | Discharge status: Home / Self Care | Payer: Medicaid Other | Source: Ambulatory Visit | Attending: Internal Medicine | Admitting: Internal Medicine

## 2023-04-19 ENCOUNTER — Other Ambulatory Visit: Payer: Self-pay

## 2023-04-19 LAB — CMP14+EGFR
ALT: 13 IU/L (ref 0–44)
AST: 17 IU/L (ref 0–40)
Albumin: 4.4 g/dL (ref 3.8–4.9)
Alkaline Phosphatase: 102 IU/L (ref 44–121)
BUN/Creatinine Ratio: 6 — ABNORMAL LOW (ref 9–20)
BUN: 7 mg/dL (ref 6–24)
Bilirubin Total: 0.4 mg/dL (ref 0.0–1.2)
CO2: 29 mmol/L (ref 20–29)
Calcium: 9.3 mg/dL (ref 8.7–10.2)
Chloride: 100 mmol/L (ref 96–106)
Creatinine, Ser: 1.08 mg/dL (ref 0.76–1.27)
Globulin, Total: 2.1 g/dL (ref 1.5–4.5)
Glucose: 91 mg/dL (ref 70–99)
Potassium: 4.5 mmol/L (ref 3.5–5.2)
Sodium: 139 mmol/L (ref 134–144)
Total Protein: 6.5 g/dL (ref 6.0–8.5)
eGFR: 81 mL/min/{1.73_m2} (ref >59)

## 2023-04-19 LAB — CBC
Hematocrit: 48.5 % (ref 37.5–51.0)
Hemoglobin: 16.6 g/dL (ref 13.0–17.7)
MCH: 30.9 pg (ref 26.6–33.0)
MCHC: 34.2 g/dL (ref 31.5–35.7)
MCV: 90 fL (ref 79–97)
Platelets: 351 10*3/uL (ref 150–450)
RBC: 5.37 x10E6/uL (ref 4.14–5.80)
RDW: 12.1 % (ref 11.6–15.4)
WBC: 8.6 10*3/uL (ref 3.4–10.8)

## 2023-04-19 LAB — LIPID PANEL
Chol/HDL Ratio: 3.7 ratio (ref 0.0–5.0)
Cholesterol, Total: 157 mg/dL (ref 100–199)
HDL: 42 mg/dL (ref >39)
LDL Chol Calc (NIH): 91 mg/dL (ref 0–99)
Triglycerides: 133 mg/dL (ref 0–149)
VLDL Cholesterol Cal: 24 mg/dL (ref 5–40)

## 2023-04-19 LAB — PSA: Prostate Specific Ag, Serum: 0.9 ng/mL (ref 0.0–4.0)

## 2023-04-23 ENCOUNTER — Ambulatory Visit (HOSPITAL_COMMUNITY)
Admission: RE | Admit: 2023-04-23 | Discharge: 2023-04-23 | Disposition: A | Discharge status: Home / Self Care | Payer: Medicaid Other | Attending: Internal Medicine | Admitting: Internal Medicine

## 2023-04-23 ENCOUNTER — Encounter (HOSPITAL_COMMUNITY)
Admission: RE | Disposition: A | Discharge status: Home / Self Care | Payer: Self-pay | Source: Home / Self Care | Attending: Internal Medicine

## 2023-04-23 ENCOUNTER — Ambulatory Visit (HOSPITAL_COMMUNITY): Payer: Medicaid Other | Admitting: Anesthesiology

## 2023-04-23 ENCOUNTER — Encounter (HOSPITAL_COMMUNITY): Payer: Self-pay

## 2023-04-23 DIAGNOSIS — K648 Other hemorrhoids: Secondary | ICD-10-CM

## 2023-04-23 DIAGNOSIS — Z139 Encounter for screening, unspecified: Secondary | ICD-10-CM | POA: Diagnosis not present

## 2023-04-23 DIAGNOSIS — K5140 Inflammatory polyps of colon without complications: Secondary | ICD-10-CM | POA: Insufficient documentation

## 2023-04-23 DIAGNOSIS — Z1211 Encounter for screening for malignant neoplasm of colon: Secondary | ICD-10-CM | POA: Diagnosis not present

## 2023-04-23 DIAGNOSIS — K219 Gastro-esophageal reflux disease without esophagitis: Secondary | ICD-10-CM | POA: Insufficient documentation

## 2023-04-23 DIAGNOSIS — K5730 Diverticulosis of large intestine without perforation or abscess without bleeding: Secondary | ICD-10-CM | POA: Insufficient documentation

## 2023-04-23 DIAGNOSIS — D120 Benign neoplasm of cecum: Secondary | ICD-10-CM | POA: Diagnosis not present

## 2023-04-23 DIAGNOSIS — Z800 Family history of malignant neoplasm of digestive organs: Secondary | ICD-10-CM | POA: Diagnosis not present

## 2023-04-23 DIAGNOSIS — Z79899 Other long term (current) drug therapy: Secondary | ICD-10-CM | POA: Diagnosis not present

## 2023-04-23 DIAGNOSIS — K635 Polyp of colon: Secondary | ICD-10-CM | POA: Diagnosis not present

## 2023-04-23 HISTORY — PX: COLONOSCOPY WITH PROPOFOL: SHX5780

## 2023-04-23 HISTORY — PX: BIOPSY: SHX5522

## 2023-04-23 SURGERY — COLONOSCOPY WITH PROPOFOL
Anesthesia: General

## 2023-04-23 MED ORDER — LACTATED RINGERS IV SOLN: INTRAVENOUS | Status: DC

## 2023-04-23 MED ORDER — PROPOFOL 500 MG/50ML IV EMUL
INTRAVENOUS | Status: DC | PRN
  Administered 2023-04-23: 150 ug/kg/min via INTRAVENOUS

## 2023-04-23 MED ORDER — PROPOFOL 10 MG/ML IV BOLUS
INTRAVENOUS | Status: DC | PRN
  Administered 2023-04-23: 60 mg via INTRAVENOUS

## 2023-04-23 NOTE — Anesthesia Postprocedure Evaluation (Signed)
Anesthesia Post Note  Patient: Jack [name]  Procedure(s) Performed: COLONOSCOPY WITH PROPOFOL BIOPSY  Patient location during evaluation: Phase II Anesthesia Type: General Level of consciousness: awake and alert and oriented Pain management: pain level controlled Vital Signs Assessment: post-procedure vital signs reviewed and stable Respiratory status: spontaneous breathing, nonlabored ventilation and respiratory function stable Cardiovascular status: blood pressure returned to baseline and stable Postop Assessment: no apparent nausea or vomiting Anesthetic complications: no  No notable events documented.   Last Vitals:  Vitals:   04/23/23 0753 04/23/23 0937  BP: 128/66 95/65  Pulse: 63 67  Resp: 17 16  Temp: 36.5 C 36.5 C  SpO2: 100% 100%    Last Pain:  Vitals:   04/23/23 0937  TempSrc:   PainSc: 0-No pain                 Natally Ribera C Dewain Platz

## 2023-04-23 NOTE — Transfer of Care (Signed)
Immediate Anesthesia Transfer of Care Note  Patient: NIVIN BRANIFF  Procedure(s) Performed: COLONOSCOPY WITH PROPOFOL BIOPSY  Patient Location: PACU  Anesthesia Type:General  Level of Consciousness: awake, alert , oriented, and patient cooperative  Airway & Oxygen Therapy: Patient Spontanous Breathing  Post-op Assessment: Report given to RN, Post -op Vital signs reviewed and stable, and Patient moving all extremities X 4  Post vital signs: Reviewed and stable  Last Vitals:  Vitals Value Taken Time  BP 95/65 04/23/23 0937  Temp 36.5 C 04/23/23 0937  Pulse 67 04/23/23 0937  Resp 16 04/23/23 0937  SpO2 100 % 04/23/23 0937    Last Pain:  Vitals:   04/23/23 0914  TempSrc:   PainSc: 0-No pain         Complications: No notable events documented.

## 2023-04-23 NOTE — Progress Notes (Signed)
Patient returning call about labs. Please call back  ?

## 2023-04-23 NOTE — Anesthesia Preprocedure Evaluation (Signed)
Anesthesia Evaluation  Patient identified by MRN, date of birth, ID band Patient awake    Reviewed: Allergy & Precautions, H&P , NPO status , Patient's Chart, lab work & pertinent test results  Airway Mallampati: II  TM Distance: >3 FB Neck ROM: Full    Dental  (+) Missing, Dental Advisory Given   Pulmonary neg pulmonary ROS   Pulmonary exam normal breath sounds clear to auscultation       Cardiovascular negative cardio ROS Normal cardiovascular exam Rhythm:Regular Rate:Normal     Neuro/Psych  Headaches PSYCHIATRIC DISORDERS Anxiety        GI/Hepatic Neg liver ROS,GERD  Medicated and Controlled,,  Endo/Other  negative endocrine ROS    Renal/GU negative Renal ROS  negative genitourinary   Musculoskeletal  (+) Arthritis , Osteoarthritis,    Abdominal   Peds negative pediatric ROS (+)  Hematology negative hematology ROS (+)   Anesthesia Other Findings   Reproductive/Obstetrics negative OB ROS                             Anesthesia Physical Anesthesia Plan  ASA: 2  Anesthesia Plan: General   Post-op Pain Management: Minimal or no pain anticipated   Induction: Intravenous  PONV Risk Score and Plan: 1 and Treatment may vary due to age or medical condition  Airway Management Planned: Nasal Cannula and Natural Airway  Additional Equipment:   Intra-op Plan:   Post-operative Plan:   Informed Consent: I have reviewed the patients History and Physical, chart, labs and discussed the procedure including the risks, benefits and alternatives for the proposed anesthesia with the patient or authorized representative who has indicated his/her understanding and acceptance.     Dental advisory given  Plan Discussed with: CRNA and Surgeon  Anesthesia Plan Comments:        Anesthesia Quick Evaluation

## 2023-04-23 NOTE — H&P (Signed)
Primary Care Physician:  Raliegh Ip, DO Primary Gastroenterologist:  Dr. Marletta Lor  Pre-Procedure History & Physical: HPI:  Jack [name] is a 57 y.o. male is here for a colonoscopy for colon cancer screening purposes.  Patient reports family history of colon cancer in 1 aunt.  No melena or hematochezia.  No abdominal pain or unintentional weight loss.  No change in bowel habits.  Overall feels well from a GI standpoint.  Past Medical History:  Diagnosis Date   Anxiety    Arthritis    GERD (gastroesophageal reflux disease)    Headache    occasional    History of kidney stones     Past Surgical History:  Procedure Laterality Date   EXTRACORPOREAL SHOCK WAVE LITHOTRIPSY Right 05/30/2018   Procedure: RIGHT EXTRACORPOREAL SHOCK WAVE LITHOTRIPSY (ESWL);  Surgeon: Crist Fat, MD;  Location: [facility];  Service: Urology;  Laterality: Right;   KNEE ARTHROSCOPY Right    KNEE SURGERY Left    3 arthroscopic,  two ACL reconstuctions   SHOULDER SURGERY Right    dislocation repair with a pin   TONSILLECTOMY AND ADENOIDECTOMY     TOTAL KNEE ARTHROPLASTY Left 07/12/2015   Procedure: TOTAL LEFT  KNEE ARTHROPLASTY;  Surgeon: Ollen Gross, MD;  Location: [facility];  Service: Orthopedics;  Laterality: Left;   tubes in ears      Prior to Admission medications   Medication Sig Start Date End Date Taking? Authorizing Provider  DULoxetine (CYMBALTA) 60 MG capsule Take 1 capsule (60 mg total) by mouth daily. Patient taking differently: Take 60 mg by mouth every evening. 02/05/23  Yes Gottschalk, Kathie Rhodes M, DO  ibuprofen (ADVIL) 200 MG tablet Take 400 mg by mouth every 6 (six) hours as needed for moderate pain or mild pain.   Yes [provider]  pregabalin (LYRICA) 100 MG capsule Take 100 mg by mouth at bedtime.   Yes [provider]  tiZANidine (ZANAFLEX) 2 MG tablet Take 2 mg by mouth daily. 03/28/23  Yes [provider]    Allergies as of 03/27/2023 - Review  Complete 03/15/2023  Allergen Reaction Noted   Tramadol Nausea And Vomiting 06/17/2015    Family History  Problem Relation Age of Onset   Arthritis Mother     Social History   Socioeconomic History   Marital status: Single    Spouse name: Not on file   Number of children: Not on file   Years of education: Not on file   Highest education level: Not on file  Occupational History   Not on file  Tobacco Use   Smoking status: Never   Smokeless tobacco: Former    Types: Designer, multimedia Use   Vaping status: Never Used  Substance and Sexual Activity   Alcohol use: Not Currently    Alcohol/week: 0.0 standard drinks of alcohol    Comment: occasional    Drug use: No   Sexual activity: Not on file  Other Topics Concern   Not on file  Social History Narrative   Not on file   Social Determinants of Health   Financial Resource Strain: Not on file  Food Insecurity: Not on file  Transportation Needs: Not on file  Physical Activity: Not on file  Stress: Not on file  Social Connections: Not on file  Intimate Partner Violence: Not on file    Review of Systems: See HPI, otherwise negative ROS  Physical Exam: Vital signs in last 24 hours: Temp:  [97.7 F (36.5  C)] 97.7 F (36.5 C) (07/15 0753) Pulse Rate:  [63] 63 (07/15 0753) Resp:  [17] 17 (07/15 0753) BP: (128)/(66) 128/66 (07/15 0753) SpO2:  [100 %] 100 % (07/15 0753)   General:   Alert,  Well-developed, well-nourished, pleasant and cooperative in NAD Head:  Normocephalic and atraumatic. Eyes:  Sclera clear, no icterus.   Conjunctiva pink. Ears:  Normal auditory acuity. Nose:  No deformity, discharge,  or lesions. Msk:  Symmetrical without gross deformities. Normal posture. Extremities:  Without clubbing or edema. Neurologic:  Alert and  oriented x4;  grossly normal neurologically. Skin:  Intact without significant lesions or rashes. Psych:  Alert and cooperative. Normal mood and affect.  Impression/Plan: Jack [name] is here for a colonoscopy to be performed for colon cancer screening purposes.  The risks of the procedure including infection, bleed, or perforation as well as benefits, limitations, alternatives and imponderables have been reviewed with the patient. Questions have been answered. All parties agreeable.

## 2023-04-23 NOTE — Discharge Instructions (Signed)
  Colonoscopy Discharge Instructions  Read the instructions outlined below and refer to this sheet in the next few weeks. These discharge instructions provide you with general information on caring for yourself after you leave the hospital. Your doctor may also give you specific instructions. While your treatment has been planned according to the most current medical practices available, unavoidable complications occasionally occur.   ACTIVITY You may resume your regular activity, but move at a slower pace for the next 24 hours.  Take frequent rest periods for the next 24 hours.  Walking will help get rid of the air and reduce the bloated feeling in your belly (abdomen).  No driving for 24 hours (because of the medicine (anesthesia) used during the test).   Do not sign any important legal documents or operate any machinery for 24 hours (because of the anesthesia used during the test).  NUTRITION Drink plenty of fluids.  You may resume your normal diet as instructed by your doctor.  Begin with a light meal and progress to your normal diet. Heavy or fried foods are harder to digest and may make you feel sick to your stomach (nauseated).  Avoid alcoholic beverages for 24 hours or as instructed.  MEDICATIONS You may resume your normal medications unless your doctor tells you otherwise.  WHAT YOU CAN EXPECT TODAY Some feelings of bloating in the abdomen.  Passage of more gas than usual.  Spotting of blood in your stool or on the toilet paper.  IF YOU HAD POLYPS REMOVED DURING THE COLONOSCOPY: No aspirin products for 7 days or as instructed.  No alcohol for 7 days or as instructed.  Eat a soft diet for the next 24 hours.  FINDING OUT THE RESULTS OF YOUR TEST Not all test results are available during your visit. If your test results are not back during the visit, make an appointment with your caregiver to find out the results. Do not assume everything is normal if you have not heard from your  caregiver or the medical facility. It is important for you to follow up on all of your test results.  SEEK IMMEDIATE MEDICAL ATTENTION IF: You have more than a spotting of blood in your stool.  Your belly is swollen (abdominal distention).  You are nauseated or vomiting.  You have a temperature over 101.  You have abdominal pain or discomfort that is severe or gets worse throughout the day.   Your colonoscopy revealed 1 polyp(s) which I removed successfully. Await pathology results, my office will contact you. I recommend repeating colonoscopy in 7-10 years for surveillance purposes.   You also have diverticulosis and internal hemorrhoids. I would recommend increasing fiber in your diet or adding OTC Benefiber/Metamucil. Be sure to drink at least 4 to 6 glasses of water daily. Follow-up with GI as needed.   I hope you have a great rest of your week!  Hennie Duos. Marletta Lor, D.O. Gastroenterology and Hepatology [facility]

## 2023-04-23 NOTE — Op Note (Signed)
[facility] Patient Name: Jack [name] Procedure Date: 04/23/2023 9:04 AM MRN: [id number] Date of Birth: 1966-09-02 Attending MD: Hennie Duos. Marletta Lor , [place], [id number] CSN: [id number] Age: 57 Admit Type: Outpatient Procedure:                Colonoscopy Indications:              Screening for colorectal malignant neoplasm Providers:                Hennie Duos. Marletta Lor, DO, Angelica Ran, Kristine L.                            Jessee Avers, Technician Referring MD:             Hennie Duos. Marletta Lor, DO Medicines:                See the Anesthesia note for documentation of the                            administered medications Complications:            No immediate complications. Estimated Blood Loss:     Estimated blood loss was minimal. Procedure:                Pre-Anesthesia Assessment:                           - The anesthesia plan was to use monitored                            anesthesia care (MAC).                           After obtaining informed consent, the colonoscope                            was passed under direct vision. Throughout the                            procedure, the patient's blood pressure, pulse, and                            oxygen saturations were monitored continuously. The                            [id number]-HQ190L ([id number]) scope was introduced through                            the anus and advanced to the the cecum, identified                            by appendiceal orifice and ileocecal valve. The                            colonoscopy was performed without difficulty. The                            patient  tolerated the procedure well. The quality                            of the bowel preparation was evaluated using the                            BBPS [facility] Bowel Preparation Scale) with scores                            of: Right Colon = 3, Transverse Colon = 3 and Left                            Colon = 3 (entire mucosa seen well with no residual                             staining, small fragments of stool or opaque                            liquid). The total BBPS score equals 9. Scope In: 9:17:36 AM Scope Out: 9:33:19 AM Scope Withdrawal Time: 0 hours 13 minutes 42 seconds  Total Procedure Duration: 0 hours 15 minutes 43 seconds  Findings:      Non-bleeding internal hemorrhoids were found during endoscopy.      Multiple medium-mouthed and small-mouthed diverticula were found in the       sigmoid colon and descending colon.      A 2 mm polyp was found in the cecum. The polyp was sessile. The polyp       was removed with a cold biopsy forceps. Resection and retrieval were       complete.      The exam was otherwise without abnormality. Impression:               - Non-bleeding internal hemorrhoids.                           - Diverticulosis in the sigmoid colon and in the                            descending colon.                           - One 2 mm polyp in the cecum, removed with a cold                            biopsy forceps. Resected and retrieved.                           - The examination was otherwise normal. Moderate Sedation:      Per Anesthesia Care Recommendation:           - Patient has a contact number available for                            emergencies. The signs and symptoms of potential  delayed complications were discussed with the                            patient. Return to normal activities tomorrow.                            Written discharge instructions were provided to the                            patient.                           - Resume previous diet.                           - Continue present medications.                           - Await pathology results.                           - Repeat colonoscopy in 7-10 years for surveillance.                           - Return to GI clinic PRN. Procedure Code(s):        --- Professional ---                            [phone], Colonoscopy, flexible; with biopsy, single                            or multiple Diagnosis Code(s):        --- Professional ---                           Z12.11, Encounter for screening for malignant                            neoplasm of colon                           K64.8, Other hemorrhoids                           D12.0, Benign neoplasm of cecum                           K57.30, Diverticulosis of large intestine without                            perforation or abscess without bleeding CPT copyright 2022 American Medical Association. All rights reserved. The codes documented in this report are preliminary and upon coder review may  be revised to meet current compliance requirements. Hennie Duos. Marletta Lor, DO Hennie Duos. Marletta Lor, DO 04/23/2023 9:37:34 AM This report has been signed electronically. Number of Addenda: 0

## 2023-04-24 LAB — SURGICAL PATHOLOGY

## 2023-04-25 DIAGNOSIS — M5451 Vertebrogenic low back pain: Secondary | ICD-10-CM | POA: Diagnosis not present

## 2023-04-26 ENCOUNTER — Encounter (HOSPITAL_COMMUNITY): Payer: Self-pay | Admitting: Internal Medicine

## 2023-05-07 ENCOUNTER — Ambulatory Visit (INDEPENDENT_AMBULATORY_CARE_PROVIDER_SITE_OTHER): Payer: Medicaid Other | Admitting: Professional Counselor

## 2023-05-07 DIAGNOSIS — F331 Major depressive disorder, recurrent, moderate: Secondary | ICD-10-CM
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
## 2023-05-08 DIAGNOSIS — M5451 Vertebrogenic low back pain: Secondary | ICD-10-CM | POA: Diagnosis not present

## 2023-05-08 DIAGNOSIS — M47896 Other spondylosis, lumbar region: Secondary | ICD-10-CM | POA: Diagnosis not present

## 2023-05-08 DIAGNOSIS — M5459 Other low back pain: Secondary | ICD-10-CM | POA: Diagnosis not present

## 2023-05-08 DIAGNOSIS — M5416 Radiculopathy, lumbar region: Secondary | ICD-10-CM | POA: Diagnosis not present

## 2023-05-09 ENCOUNTER — Telehealth (INDEPENDENT_AMBULATORY_CARE_PROVIDER_SITE_OTHER): Payer: Medicaid Other | Admitting: Professional Counselor

## 2023-05-09 DIAGNOSIS — M5416 Radiculopathy, lumbar region: Secondary | ICD-10-CM | POA: Diagnosis not present

## 2023-05-09 DIAGNOSIS — F331 Major depressive disorder, recurrent, moderate: Secondary | ICD-10-CM
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
## 2023-05-14 ENCOUNTER — Ambulatory Visit (INDEPENDENT_AMBULATORY_CARE_PROVIDER_SITE_OTHER): Payer: Medicaid Other | Admitting: Professional Counselor

## 2023-05-14 DIAGNOSIS — F331 Major depressive disorder, recurrent, moderate: Secondary | ICD-10-CM

## 2023-05-14 NOTE — BH Specialist Note (Signed)
[place] BH Follow-up  MRN: [id number] NAME: Jack [name] Date: 05/14/23  Start time: Start Time: 0330 End time: Stop Time: 0400 Total time: Total Time in Minutes (Visit): 30 Call number: Visit Number: 3- Third Visit  Reason for call today:  The patient is a 57 year old male returning for a collaborative care follow-up. He reports a significantly improved mood from last week, stating that having someone to talk to has helped him a lot by allowing him to process his feelings and emotions, which has decreased his depression. The patient still faces some financial stressors and is trying to resolve his disability claim, but he found time to go on a date this week and enjoyed being present during it. He also discussed his past relationship and the associated trigger points, and he is working on being more honest and maintaining open communication in future relationships. The counselor will encourage him to continue focusing on mindfulness, being present, and practicing more self-care. A follow-up appointment is scheduled for next week. The patient reports that his medication is working well, his sleep has improved, and he is better able to work, cope, and manage his daily activities.  PHQ-9 Scores:     05/14/2023    3:46 PM 05/07/2023    2:16 PM 04/18/2023    9:36 AM 02/05/2023   12:55 PM 12/15/2022    2:27 PM  Depression screen PHQ 2/9  Decreased Interest 1 2 2 2 1   Down, Depressed, Hopeless 1 3 2 2 2   PHQ - 2 Score 2 5 4 4 3   Altered sleeping 1 2 2 1 1   Tired, decreased energy 2 3 2 2 1   Change in appetite 1 2 1 1 1   Feeling bad or failure about yourself  2 3 2 2 2   Trouble concentrating 1 2 2 1 1   Moving slowly or fidgety/restless 1 3 3 1 1   Suicidal thoughts 0 2 1 1  0  PHQ-9 Score 10 22 17 13 10   Difficult doing work/chores   Somewhat difficult Somewhat difficult Somewhat difficult   GAD-7 Scores:     05/14/2023    3:49 PM 05/07/2023    2:17 PM 04/18/2023    9:36 AM 02/05/2023   12:55  PM  GAD 7 : Generalized Anxiety Score  Nervous, Anxious, on Edge 2 2 3 2   Control/stop worrying 2 2 3 3   Worry too much - different things 3 3 2 2   Trouble relaxing 2 2 2 2   Restless 2 2 1 2   Easily annoyed or irritable 2 2 2 2   Afraid - awful might happen 1 2 2 1   Total GAD 7 Score 14 15 15 14   Anxiety Difficulty Somewhat difficult  Somewhat difficult Somewhat difficult    Stress Current stressors:  Financial issues Sleep:  Improved Appetite:  Good Coping ability:  Fair Patient taking medications as prescribed:  Yes  Current medications:  Outpatient Encounter Medications as of 05/14/2023  Medication Sig   DULoxetine (CYMBALTA) 60 MG capsule Take 1 capsule (60 mg total) by mouth daily. (Patient taking differently: Take 60 mg by mouth every evening.)   ibuprofen (ADVIL) 200 MG tablet Take 400 mg by mouth every 6 (six) hours as needed for moderate pain or mild pain.   pregabalin (LYRICA) 100 MG capsule Take 100 mg by mouth at bedtime.   tiZANidine (ZANAFLEX) 2 MG tablet Take 2 mg by mouth daily.   No facility-administered encounter medications on file as of 05/14/2023.  Self-harm Behaviors Risk Assessment Self-harm risk factors:  Denies Patient endorses recent thoughts of harming self:   Psychologist, occupational Health from 05/14/2023 in [facility] from 05/07/2023 in [facility] Admission (Discharged) from 04/23/2023 in [facility]  C-SSRS RISK CATEGORY No Risk Error: Question 6 not populated No Risk       Danger to Others Risk Assessment Danger to others risk factors:  Low risk Patient endorses recent thoughts of harming others:  Denies   Substance Use Assessment Patient recently consumed alcohol:  no  Alcohol Use Disorder Identification Test (AUDIT):     05/07/2023    1:53 PM  Alcohol Use Disorder Test (AUDIT)  1. How often do you have a drink containing  alcohol? 2  2. How many drinks containing alcohol do you have on a typical day when you are drinking? 0  3. How often do you have six or more drinks on one occasion? 0  AUDIT-C Score 2    Goals, Interventions and Follow-up Plan Goals: Increase healthy adjustment to current life circumstances Interventions: Behavioral Activation and CBT Cognitive Behavioral Therapy Follow-up Plan:  Follow up   [name]

## 2023-05-21 ENCOUNTER — Ambulatory Visit (INDEPENDENT_AMBULATORY_CARE_PROVIDER_SITE_OTHER): Payer: Medicaid Other | Admitting: Professional Counselor

## 2023-05-21 DIAGNOSIS — F331 Major depressive disorder, recurrent, moderate: Secondary | ICD-10-CM

## 2023-05-21 NOTE — BH Specialist Note (Signed)
[place] BH Follow-up  MRN: [id number] NAME: Jack [name] Date: 05/21/23  Start time: Start Time: 0330 End time: Stop Time: 0400 Total time: Total Time in Minutes (Visit): 30 Call number: Visit Number: 4- Fourth Visit  Reason for call today:  The patient, a 57 year old male, returned for a collaborative care follow-up presenting with a depressed mood, noticeable pain, low tone of voice, and minimal eye contact. He reported that this past week has been particularly rough for him, both physically and mentally. He is experiencing severe back pain, which has led to him spending most of his time in bed and limiting his activity. Additionally, he missed two days of his Cymbalta medication, which contributed to a further decline in his mood. The patient continues to struggle with financial stress and is awaiting a decision on his disability claim. He noted that he has been socially isolating and not interacting much with others.  During the session, the behavioral counselor processed some of these difficulties with the patient and encouraged him to engage in behavioral activation, such as increasing social activities with family and friends, and reducing the amount of time he spends in bed despite his pain. Suggestions included sitting on his porch and practicing mindfulness activities. The patient requested a muscle relaxer that he found helpful in the past, believing that if his pain were reduced, he could be more active, which would likely improve his mood. The counselor also emphasized the importance of taking Cymbalta daily as prescribed to help maintain his mood stability. The patient denies having any suicidal thoughts, plans, or intent. The behavioral counselor plans to follow up with the psychiatric consultant and the primary care physician for any further recommendations. A follow-up appointment is scheduled for one week.  PHQ-9 Scores:     05/21/2023    3:30 PM 05/14/2023    3:46 PM 05/07/2023     2:16 PM 04/18/2023    9:36 AM 02/05/2023   12:55 PM  Depression screen PHQ 2/9  Decreased Interest 2 1 2 2 2   Down, Depressed, Hopeless 2 1 3 2 2   PHQ - 2 Score 4 2 5 4 4   Altered sleeping 1 1 2 2 1   Tired, decreased energy 2 2 3 2 2   Change in appetite 1 1 2 1 1   Feeling bad or failure about yourself  2 2 3 2 2   Trouble concentrating 1 1 2 2 1   Moving slowly or fidgety/restless 1 1 3 3 1   Suicidal thoughts 1 0 2 1 1   PHQ-9 Score 13 10 22 17 13   Difficult doing work/chores Very difficult   Somewhat difficult Somewhat difficult   GAD-7 Scores:     05/21/2023    3:32 PM 05/14/2023    3:49 PM 05/07/2023    2:17 PM 04/18/2023    9:36 AM  GAD 7 : Generalized Anxiety Score  Nervous, Anxious, on Edge 2 2 2 3   Control/stop worrying 3 2 2 3   Worry too much - different things 3 3 3 2   Trouble relaxing 2 2 2 2   Restless 2 2 2 1   Easily annoyed or irritable 2 2 2 2   Afraid - awful might happen 1 1 2 2   Total GAD 7 Score 15 14 15 15   Anxiety Difficulty Very difficult Somewhat difficult  Somewhat difficult    Stress Current stressors:  Financial stress Sleep:  Sleeping more often Appetite:  Fair Coping ability:  Fair Patient taking medications as prescribed:  No. Missed  two days of his Cymbalta.  Current medications:  Outpatient Encounter Medications as of 05/21/2023  Medication Sig   DULoxetine (CYMBALTA) 60 MG capsule Take 1 capsule (60 mg total) by mouth daily. (Patient taking differently: Take 60 mg by mouth every evening.)   ibuprofen (ADVIL) 200 MG tablet Take 400 mg by mouth every 6 (six) hours as needed for moderate pain or mild pain.   pregabalin (LYRICA) 100 MG capsule Take 100 mg by mouth at bedtime.   tiZANidine (ZANAFLEX) 2 MG tablet Take 2 mg by mouth daily.   No facility-administered encounter medications on file as of 05/21/2023.     Self-harm Behaviors Risk Assessment Self-harm risk factors:  Low risk Patient endorses recent thoughts of harming self:   Denies   Danger to Others Risk Assessment Danger to others risk factors:  None Patient endorses recent thoughts of harming others:  Denies    Substance Use Assessment Patient recently consumed alcohol:    Alcohol Use Disorder Identification Test (AUDIT):     05/07/2023    1:53 PM  Alcohol Use Disorder Test (AUDIT)  1. How often do you have a drink containing alcohol? 2  2. How many drinks containing alcohol do you have on a typical day when you are drinking? 0  3. How often do you have six or more drinks on one occasion? 0  AUDIT-C Score 2    Goals, Interventions and Follow-up Plan Goals: patient is hoping to decrease pain, increase activity and be more social. Interventions: Behavioral Activation Follow-up Plan:  Follow up in one week.    [name]

## 2023-05-23 ENCOUNTER — Telehealth: Payer: Self-pay | Admitting: Professional Counselor

## 2023-05-23 DIAGNOSIS — F331 Major depressive disorder, recurrent, moderate: Secondary | ICD-10-CM

## 2023-05-23 NOTE — BH Specialist Note (Signed)
Behavioral Health Treatment Plan Team Note  MRN: [id number] NAME: Jack [name]  DATE: 05/25/23  Start time: Start Time: 0405 End time: Stop Time: 0410 Total time: Total Time in Minutes (Visit): 5  Total number of Virtual BH Treatment Team Plan encounters: 2/4  Treatment Team Attendees: Dr. Vanetta Shawl  Diagnoses:    ICD-10-CM   1. Moderate episode of recurrent major depressive disorder (HCC)  F33.1       Goals, Interventions and Follow-up Plan Goals: Increase social activities, mindfulness practice, and decrease isolation Interventions: Behavioral Activation Medication Management Recommendations: Defer to spinal specialist Follow-up Plan: Have patient follow up with specialist about back pain. Contiune weekly therapy.  History of the present illness Presenting Problem/Current Symptoms:  The patient, a 57 year old male, returned for a collaborative care follow-up presenting with a depressed mood, noticeable pain, low tone of voice, and minimal eye contact. He reported that this past week has been particularly rough for him, both physically and mentally. He is experiencing severe back pain, which has led to him spending most of his time in bed and limiting his activity. Additionally, he missed two days of his Cymbalta medication, which contributed to a further decline in his mood. The patient continues to struggle with financial stress and is awaiting a decision on his disability claim. He noted that he has been socially isolating and not interacting much with others.   During the session, the behavioral counselor processed some of these difficulties with the patient and encouraged him to engage in behavioral activation, such as increasing social activities with family and friends, and reducing the amount of time he spends in bed despite his pain. Suggestions included sitting on his porch and practicing mindfulness activities. The patient requested a muscle relaxer that he found helpful in  the past, believing that if his pain were reduced, he could be more active, which would likely improve his mood. The counselor also emphasized the importance of taking Cymbalta daily as prescribed to help maintain his mood stability. The patient denies having any suicidal thoughts, plans, or intent. The behavioral counselor plans to follow up with the psychiatric consultant and the primary care physician for any further recommendations. A follow-up appointment is scheduled for one week.   Screenings PHQ-9 Assessments:     05/21/2023    3:30 PM 05/14/2023    3:46 PM 05/07/2023    2:16 PM  Depression screen PHQ 2/9  Decreased Interest 2 1 2   Down, Depressed, Hopeless 2 1 3   PHQ - 2 Score 4 2 5   Altered sleeping 1 1 2   Tired, decreased energy 2 2 3   Change in appetite 1 1 2   Feeling bad or failure about yourself  2 2 3   Trouble concentrating 1 1 2   Moving slowly or fidgety/restless 1 1 3   Suicidal thoughts 1 0 2  PHQ-9 Score 13 10 22   Difficult doing work/chores Very difficult     GAD-7 Assessments:     05/21/2023    3:32 PM 05/14/2023    3:49 PM 05/07/2023    2:17 PM 04/18/2023    9:36 AM  GAD 7 : Generalized Anxiety Score  Nervous, Anxious, on Edge 2 2 2 3   Control/stop worrying 3 2 2 3   Worry too much - different things 3 3 3 2   Trouble relaxing 2 2 2 2   Restless 2 2 2 1   Easily annoyed or irritable 2 2 2 2   Afraid - awful might happen 1 1 2 2   Total  GAD 7 Score 15 14 15 15   Anxiety Difficulty Very difficult Somewhat difficult  Somewhat difficult    Past Medical History Past Medical History:  Diagnosis Date   Anxiety    Arthritis    GERD (gastroesophageal reflux disease)    Headache    occasional    History of kidney stones     Vital signs: There were no vitals filed for this visit.  Allergies:  Allergies as of 05/23/2023 - Review Complete 04/23/2023  Allergen Reaction Noted   Tramadol Nausea And Vomiting 06/17/2015    Medication History Current medications:   Outpatient Encounter Medications as of 05/23/2023  Medication Sig   DULoxetine (CYMBALTA) 60 MG capsule Take 1 capsule (60 mg total) by mouth daily. (Patient taking differently: Take 60 mg by mouth every evening.)   ibuprofen (ADVIL) 200 MG tablet Take 400 mg by mouth every 6 (six) hours as needed for moderate pain or mild pain.   pregabalin (LYRICA) 100 MG capsule Take 100 mg by mouth at bedtime.   tiZANidine (ZANAFLEX) 2 MG tablet Take 2 mg by mouth daily.   No facility-administered encounter medications on file as of 05/23/2023.     Scribe for Treatment Team: [name]

## 2023-05-28 ENCOUNTER — Ambulatory Visit (INDEPENDENT_AMBULATORY_CARE_PROVIDER_SITE_OTHER): Payer: Medicaid Other | Admitting: Professional Counselor

## 2023-05-28 DIAGNOSIS — F331 Major depressive disorder, recurrent, moderate: Secondary | ICD-10-CM | POA: Diagnosis not present

## 2023-05-28 NOTE — BH Specialist Note (Signed)
[place] BH Follow-up  MRN: [id number] NAME: Jack [name] Date: 05/28/23  Start time: Start Time: 0330 End time: Stop Time: 0400 Total time: Total Time in Minutes (Visit): 30 Call number: Visit Number: 6-Sixth Visit  Reason for call today: The patient is a 57 year old male who presented for a collaborative care follow-up. He reports having had a much better week compared to the previous one, with improved sleep, decreased depressive symptoms, and reduced anxiety. His overall mood is more positive, which is reflected in his affect and energy levels during the session. He made good eye contact and was enthusiastic while speaking.   However, he still experiences a few down days, continuing to struggle with depression and anxiety, primarily due to financial issues and isolation. The patient reports that his limited finances prevent him from going out and interacting with others, leading to increased fear and worry. He is still awaiting the outcome of his disability claim, which contributes to his stress since he is unable to work.   The patient has an upcoming appointment with a spinal doctor on the 27th, where he hopes to get back on muscle relaxers that have previously been effective in managing his pain. His pain remains severe and significantly impacts his day-to-day functioning, and he believes that reducing his pain could also improve his mood.  Overall, the patient is doing well in engaging with counseling and finds the collaborative care approach to be beneficial for his progress. He expressed satisfaction with the therapy style and plans to follow up in two weeks.   PHQ-9 Scores:     05/28/2023    3:35 PM 05/21/2023    3:30 PM 05/14/2023    3:46 PM 05/07/2023    2:16 PM 04/18/2023    9:36 AM  Depression screen PHQ 2/9  Decreased Interest 1 2 1 2 2   Down, Depressed, Hopeless 1 2 1 3 2   PHQ - 2 Score 2 4 2 5 4   Altered sleeping 2 1 1 2 2   Tired, decreased energy 2 2 2 3 2   Change in  appetite 0 1 1 2 1   Feeling bad or failure about yourself  2 2 2 3 2   Trouble concentrating 1 1 1 2 2   Moving slowly or fidgety/restless 2 1 1 3 3   Suicidal thoughts 1 1 0 2 1  PHQ-9 Score 12 13 10 22 17   Difficult doing work/chores  Very difficult   Somewhat difficult   GAD-7 Scores:     05/28/2023    3:36 PM 05/21/2023    3:32 PM 05/14/2023    3:49 PM 05/07/2023    2:17 PM  GAD 7 : Generalized Anxiety Score  Nervous, Anxious, on Edge 2 2 2 2   Control/stop worrying 2 3 2 2   Worry too much - different things 2 3 3 3   Trouble relaxing 2 2 2 2   Restless 2 2 2 2   Easily annoyed or irritable 2 2 2 2   Afraid - awful might happen 1 1 1 2   Total GAD 7 Score 13 15 14 15   Anxiety Difficulty Very difficult Very difficult Somewhat difficult     Stress Current stressors:  Financial stress  Sleep:  Improved Appetite:  Good Coping ability:  Good Patient taking medications as prescribed:  Yes  Current medications:  Outpatient Encounter Medications as of 05/28/2023  Medication Sig   DULoxetine (CYMBALTA) 60 MG capsule Take 1 capsule (60 mg total) by mouth daily. (Patient taking differently: Take 60 mg  by mouth every evening.)   ibuprofen (ADVIL) 200 MG tablet Take 400 mg by mouth every 6 (six) hours as needed for moderate pain or mild pain.   pregabalin (LYRICA) 100 MG capsule Take 100 mg by mouth at bedtime.   tiZANidine (ZANAFLEX) 2 MG tablet Take 2 mg by mouth daily.   No facility-administered encounter medications on file as of 05/28/2023.     Self-harm Behaviors Risk Assessment Self-harm risk factors:  Low risk Patient endorses recent thoughts of harming self:  Denies   Danger to Others Risk Assessment Danger to others risk factors:  Low risk Patient endorses recent thoughts of harming others:  Denies   Substance Use Assessment Patient recently consumed alcohol:  Denies   Goals, Interventions and Follow-up Plan Goals:  Improve relationships  Interventions: Behavioral  Activation and CBT Cognitive Behavioral Therapy Follow-up Plan: Follow up in 2 weeks  [name]

## 2023-06-05 DIAGNOSIS — M5451 Vertebrogenic low back pain: Secondary | ICD-10-CM | POA: Diagnosis not present

## 2023-06-11 DIAGNOSIS — H5213 Myopia, bilateral: Secondary | ICD-10-CM | POA: Diagnosis not present

## 2023-06-12 ENCOUNTER — Ambulatory Visit: Payer: Medicaid Other | Admitting: Professional Counselor

## 2023-06-12 DIAGNOSIS — F331 Major depressive disorder, recurrent, moderate: Secondary | ICD-10-CM
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
## 2023-06-13 ENCOUNTER — Telehealth (INDEPENDENT_AMBULATORY_CARE_PROVIDER_SITE_OTHER): Payer: Medicaid Other | Admitting: Professional Counselor

## 2023-06-13 DIAGNOSIS — F331 Major depressive disorder, recurrent, moderate: Secondary | ICD-10-CM

## 2023-06-13 NOTE — BH Specialist Note (Signed)
Behavioral Health Treatment Plan Team Note  MRN: [id number] NAME: Jack [name]  DATE: 06/15/23  Start time: Start Time: 0342 End time: Stop Time: 0415 Total time: Total Time in Minutes (Visit): 33  Total number of Virtual BH Treatment Team Plan encounters: 1/4  Treatment Team Attendees: Dr. Vanetta Shawl and Esmond Harps  Diagnoses:    ICD-10-CM   1. Moderate episode of recurrent major depressive disorder (HCC)  F33.1       Goals, Interventions and Follow-up Plan Goals: Improve relationships  Interventions: Mindfulness or Relaxation Training Behavioral Activation CBT Cognitive Behavioral Therapy Medication Management Recommendations: Consider uptitration of duloxetine to 90mg  daily. Follow-up Plan: We are looking for resources to help bridge patient to services that align with his goals and provide the higher level of care he seeks. A follow-up appointment is scheduled for two weeks, during which time we will continue to support him and work on making the necessary referrals to facilitate this transition.  History of the present illness Presenting Problem/Current Symptoms: The patient is a 57 year old male returning for a collaborative care follow-up. His symptoms have remained relatively unchanged, as he continues to struggle with depressive symptoms, anxiety, back pain, and knee pain. He is starting to feel that he might need more intensive care and has expressed interest in pursuing psychiatric services and more traditional long-term therapy. While he believes that collaborative care has been beneficial as a bridge, he feels he is still dealing with the same issues and not finding sufficient relief with his current treatment regimen.   Screenings PHQ-9 Assessments:     06/12/2023    3:47 PM 05/28/2023    3:35 PM 05/21/2023    3:30 PM  Depression screen PHQ 2/9  Decreased Interest 1 1 2   Down, Depressed, Hopeless 2 1 2   PHQ - 2 Score 3 2 4   Altered sleeping 1 2 1   Tired, decreased  energy 1 2 2   Change in appetite 1 0 1  Feeling bad or failure about yourself  2 2 2   Trouble concentrating 1 1 1   Moving slowly or fidgety/restless 2 2 1   Suicidal thoughts 1 1 1   PHQ-9 Score 12 12 13   Difficult doing work/chores   Very difficult   GAD-7 Assessments:     06/12/2023    3:50 PM 05/28/2023    3:36 PM 05/21/2023    3:32 PM 05/14/2023    3:49 PM  GAD 7 : Generalized Anxiety Score  Nervous, Anxious, on Edge 2 2 2 2   Control/stop worrying 3 2 3 2   Worry too much - different things 2 2 3 3   Trouble relaxing 2 2 2 2   Restless 2 2 2 2   Easily annoyed or irritable 2 2 2 2   Afraid - awful might happen 1 1 1 1   Total GAD 7 Score 14 13 15 14   Anxiety Difficulty Very difficult Very difficult Very difficult Somewhat difficult    Past Medical History Past Medical History:  Diagnosis Date   Anxiety    Arthritis    GERD (gastroesophageal reflux disease)    Headache    occasional    History of kidney stones     Vital signs: There were no vitals filed for this visit.  Allergies:  Allergies as of 06/13/2023 - Review Complete 04/23/2023  Allergen Reaction Noted   Tramadol Nausea And Vomiting 06/17/2015    Medication History Current medications:  Outpatient Encounter Medications as of 06/13/2023  Medication Sig   DULoxetine (CYMBALTA) 60 MG  capsule Take 1 capsule (60 mg total) by mouth daily. (Patient taking differently: Take 60 mg by mouth every evening.)   ibuprofen (ADVIL) 200 MG tablet Take 400 mg by mouth every 6 (six) hours as needed for moderate pain or mild pain.   pregabalin (LYRICA) 100 MG capsule Take 100 mg by mouth at bedtime.   tiZANidine (ZANAFLEX) 2 MG tablet Take 2 mg by mouth daily.   No facility-administered encounter medications on file as of 06/13/2023.     Scribe for Treatment Team: [name]

## 2023-06-15 ENCOUNTER — Other Ambulatory Visit: Payer: Self-pay | Admitting: Family Medicine

## 2023-06-15 DIAGNOSIS — F430 Acute stress reaction: Secondary | ICD-10-CM

## 2023-06-15 DIAGNOSIS — F419 Anxiety disorder, unspecified: Secondary | ICD-10-CM

## 2023-06-15 DIAGNOSIS — G8929 Other chronic pain: Secondary | ICD-10-CM

## 2023-06-15 MED ORDER — DULOXETINE HCL 30 MG PO CPEP
90.0000 mg | ORAL_CAPSULE | Freq: Every day | ORAL | 0 refills | Status: DC
Start: 2023-06-15 — End: 2023-07-23

## 2023-06-26 ENCOUNTER — Ambulatory Visit: Payer: Medicaid Other | Admitting: Professional Counselor

## 2023-06-26 DIAGNOSIS — F331 Major depressive disorder, recurrent, moderate: Secondary | ICD-10-CM

## 2023-06-26 NOTE — BH Specialist Note (Signed)
[facility] Follow-up  MRN: [id number] NAME: Jack [name] Date: 06/26/23  Start time: Start Time: 0230 End time: Stop Time: 0315 Total time: Total Time in Minutes (Visit): 45 Call number: Visit Number: Additional Visit  Reason for call today:  The patient is a 57 year old male with a history of depression and anxiety, returning for a follow-up. Since the last visit, his symptoms have slightly decreased, although his PHQ-9 score remains unchanged. He reports feeling significantly better over the past two weeks, engaging more in activities he finds therapeutic, such as drawing and other arts and crafts. He has also been more socially active, talking to family members and attending a concert with a male friend, which he describes as a very enjoyable experience. He expresses a desire to continue engaging in similar activities.  Despite these improvements, the patient continues to report symptoms of anxiety, though his GAD-7 score has decreased from 14 to 11. He still struggles with occasional sleep difficulties, restlessness, and irritability, but notes that these symptoms have slightly improved. He is managing these challenges through counseling, medication, and behavioral activation techniques.  The patient continues to experience financial stress and is navigating the disability application process, which has been a source of ongoing stress. He has requested both a psychiatric and medical evaluation to determine his eligibility for disability, which has contributed to his current stress levels.  Overall, the patient maintains a positive outlook, acknowledging that while his symptoms still impact his daily functioning, he is managing them with his current treatment plan. A psychiatric referral has been placed per his request for an additional evaluation of his symptoms. We will follow up in two weeks to monitor his progress.  PHQ-9 Scores:     06/26/2023    2:36 PM 06/12/2023    3:47 PM 05/28/2023     3:35 PM 05/21/2023    3:30 PM 05/14/2023    3:46 PM  Depression screen PHQ 2/9  Decreased Interest 1 1 1 2 1   Down, Depressed, Hopeless 2 2 1 2 1   PHQ - 2 Score 3 3 2 4 2   Altered sleeping 2 1 2 1 1   Tired, decreased energy 1 1 2 2 2   Change in appetite 1 1 0 1 1  Feeling bad or failure about yourself  2 2 2 2 2   Trouble concentrating 2 1 1 1 1   Moving slowly or fidgety/restless 1 2 2 1 1   Suicidal thoughts 0 1 1 1  0  PHQ-9 Score 12 12 12 13 10   Difficult doing work/chores Somewhat difficult   Very difficult    GAD-7 Scores:     06/26/2023    2:38 PM 06/12/2023    3:50 PM 05/28/2023    3:36 PM 05/21/2023    3:32 PM  GAD 7 : Generalized Anxiety Score  Nervous, Anxious, on Edge 2 2 2 2   Control/stop worrying 1 3 2 3   Worry too much - different things 2 2 2 3   Trouble relaxing 2 2 2 2   Restless 1 2 2 2   Easily annoyed or irritable 2 2 2 2   Afraid - awful might happen 1 1 1 1   Total GAD 7 Score 11 14 13 15   Anxiety Difficulty Somewhat difficult Very difficult Very difficult Very difficult    Stress Current stressors:  Financial issues Sleep:  Improved Appetite:  Good Coping ability:  Fair Patient taking medications as prescribed:  Yes.  Current medications:  Outpatient Encounter Medications as of 06/26/2023  Medication Sig  DULoxetine (CYMBALTA) 30 MG capsule Take 3 capsules (90 mg total) by mouth daily. **new dose   ibuprofen (ADVIL) 200 MG tablet Take 400 mg by mouth every 6 (six) hours as needed for moderate pain or mild pain.   pregabalin (LYRICA) 100 MG capsule Take 100 mg by mouth at bedtime.   tiZANidine (ZANAFLEX) 2 MG tablet Take 2 mg by mouth daily.   No facility-administered encounter medications on file as of 06/26/2023.     Self-harm Behaviors Risk Assessment Self-harm risk factors:  None Patient endorses recent thoughts of harming self:  Denies   Danger to Others Risk Assessment Danger to others risk factors:  None Patient endorses recent thoughts of  harming others:  Denies   Substance Use Assessment Patient recently consumed alcohol:    Alcohol Use Disorder Identification Test (AUDIT):     05/07/2023    1:53 PM  Alcohol Use Disorder Test (AUDIT)  1. How often do you have a drink containing alcohol? 2  2. How many drinks containing alcohol do you have on a typical day when you are drinking? 0  3. How often do you have six or more drinks on one occasion? 0  AUDIT-C Score 2       Goals, Interventions and Follow-up Plan Goals:  Be more socially active, decrease symptoms and maintain improved mood.  Interventions: CBT Cognitive Behavioral Therapy Follow-up Plan:  2 week follow up    [name]

## 2023-07-10 ENCOUNTER — Ambulatory Visit: Payer: Medicaid Other | Admitting: Professional Counselor

## 2023-07-10 DIAGNOSIS — F331 Major depressive disorder, recurrent, moderate: Secondary | ICD-10-CM
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
## 2023-07-13 ENCOUNTER — Telehealth: Payer: Self-pay | Admitting: Professional Counselor

## 2023-07-13 DIAGNOSIS — F331 Major depressive disorder, recurrent, moderate: Secondary | ICD-10-CM
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
## 2023-07-23 ENCOUNTER — Ambulatory Visit: Payer: Medicaid Other | Admitting: Family Medicine

## 2023-07-23 ENCOUNTER — Encounter: Payer: Self-pay | Admitting: Family Medicine

## 2023-07-23 VITALS — BP 121/78 | HR 73 | Temp 98.4°F | Ht 68.0 in | Wt 198.2 lb

## 2023-07-23 DIAGNOSIS — M5441 Lumbago with sciatica, right side: Secondary | ICD-10-CM | POA: Diagnosis not present

## 2023-07-23 DIAGNOSIS — F419 Anxiety disorder, unspecified: Secondary | ICD-10-CM | POA: Diagnosis not present

## 2023-07-23 DIAGNOSIS — F331 Major depressive disorder, recurrent, moderate: Secondary | ICD-10-CM

## 2023-07-23 DIAGNOSIS — G8929 Other chronic pain: Secondary | ICD-10-CM

## 2023-07-23 DIAGNOSIS — F430 Acute stress reaction: Secondary | ICD-10-CM

## 2023-07-23 MED ORDER — DULOXETINE HCL 30 MG PO CPEP
90.0000 mg | ORAL_CAPSULE | Freq: Every day | ORAL | 3 refills | Status: DC
Start: 2023-07-23 — End: 2024-01-28

## 2023-07-23 MED ORDER — IBUPROFEN 400 MG PO TABS: 400.0000 mg | ORAL_TABLET | Freq: Three times a day (TID) | ORAL | 0 refills | Status: DC | PRN

## 2023-07-23 NOTE — Progress Notes (Signed)
Subjective: CC: Anxiety and depression PCP: Raliegh Ip, DO ZOX:[id number] T Jack [name] is a 57 y.o. male presenting to clinic today for:  1.  Anxiety and depression/ chronic back pain Continues to follow-up regularly with integrated behavioral specialist.  He notes that he will be establishing care with Dr. Adrian Blackwater soon.  He is tolerating the increased dose of duloxetine without difficulty.  No reports of excessive daytime sedation.  Utilizing his back medications in the evening time. Continues to see back specialist.   ROS: Per HPI  Allergies  Allergen Reactions   Tramadol Nausea And Vomiting   Past Medical History:  Diagnosis Date   Anxiety    Arthritis    GERD (gastroesophageal reflux disease)    Headache    occasional    History of kidney stones     Current Outpatient Medications:    pregabalin (LYRICA) 100 MG capsule, Take 100 mg by mouth at bedtime., Disp: , Rfl:    tiZANidine (ZANAFLEX) 2 MG tablet, Take 2 mg by mouth daily., Disp: , Rfl:    DULoxetine (CYMBALTA) 30 MG capsule, Take 3 capsules (90 mg total) by mouth daily. **new dose, Disp: 270 capsule, Rfl: 3   ibuprofen (ADVIL) 400 MG tablet, Take 1 tablet (400 mg total) by mouth every 8 (eight) hours as needed for moderate pain or mild pain., Disp: 30 tablet, Rfl: 0 Social History   Socioeconomic History   Marital status: Single    Spouse name: Not on file   Number of children: Not on file   Years of education: Not on file   Highest education level: Not on file  Occupational History   Not on file  Tobacco Use   Smoking status: Never   Smokeless tobacco: Former    Types: Designer, multimedia Use   Vaping status: Never Used  Substance and Sexual Activity   Alcohol use: Not Currently    Alcohol/week: 0.0 standard drinks of alcohol    Comment: occasional    Drug use: No   Sexual activity: Not on file  Other Topics Concern   Not on file  Social History Narrative   Not on file   Social Determinants of Health    Financial Resource Strain: Not on file  Food Insecurity: Not on file  Transportation Needs: Not on file  Physical Activity: Not on file  Stress: Not on file  Social Connections: Not on file  Intimate Partner Violence: Not on file   Family History  Problem Relation Age of Onset   Arthritis Mother     Objective: Office vital signs reviewed. BP 121/78   Pulse 73   Temp 98.4 F (36.9 C)   Ht 5\' 8"  (1.727 m)   Wt 198 lb 3.2 oz (89.9 kg)   SpO2 99%   BMI 30.14 kg/m   Physical Examination:  General: Awake, alert, well nourished, No acute distress HEENT: Sclera white.  Moist mucous membranes Cardio: regular rate and rhythm, S1S2 heard, no murmurs appreciated Pulm: clear to auscultation bilaterally, no wheezes, rhonchi or rales; normal work of breathing on room air MSK: Ambulating independently with normal gait and station     07/23/2023   11:09 AM 07/10/2023    2:36 PM 06/26/2023    2:36 PM  Depression screen PHQ 2/9  Decreased Interest 1 2 1   Down, Depressed, Hopeless 2 2 2   PHQ - 2 Score 3 4 3   Altered sleeping 2 2 2   Tired, decreased energy 2 2 1   Change  in appetite 0 1 1  Feeling bad or failure about yourself  2 3 2   Trouble concentrating 1 2 2   Moving slowly or fidgety/restless 2 3 1   Suicidal thoughts 0 1 0  PHQ-9 Score 12 18 12   Difficult doing work/chores Very difficult  Somewhat difficult      07/23/2023   11:10 AM 07/10/2023    2:46 PM 06/26/2023    2:38 PM 06/12/2023    3:50 PM  GAD 7 : Generalized Anxiety Score  Nervous, Anxious, on Edge 2 2 2 2   Control/stop worrying 2 2 1 3   Worry too much - different things 2 2 2 2   Trouble relaxing 2 3 2 2   Restless 2 3 1 2   Easily annoyed or irritable 1 3 2 2   Afraid - awful might happen 2 3 1 1   Total GAD 7 Score 13 18 11 14   Anxiety Difficulty Somewhat difficult Very difficult Somewhat difficult Very difficult      Assessment/ Plan: 57 y.o. male   Moderate episode of recurrent major depressive  disorder (HCC)  Chronic right-sided low back pain with right-sided sciatica - Plan: ibuprofen (ADVIL) 400 MG tablet, DULoxetine (CYMBALTA) 30 MG capsule  Reaction, stress, acute - Plan: DULoxetine (CYMBALTA) 30 MG capsule  Anxiety - Plan: DULoxetine (CYMBALTA) 30 MG capsule  Continues to have some level of anxiety and depressive symptoms but overall the seem to be getting better.  He will be seeing Dr. Adrian Blackwater soon to establish care.  Continue to follow-up with Arlys John for counseling.  Next appointment tomorrow  I renewed ibuprofen for as needed use.  Continues to see back specialist as needed.  Will give tetanus tomorrow as we were out of stock today.  Raliegh Ip, DO [facility] [phone]

## 2023-07-24 ENCOUNTER — Ambulatory Visit: Payer: Medicaid Other | Admitting: Professional Counselor

## 2023-07-24 ENCOUNTER — Ambulatory Visit (INDEPENDENT_AMBULATORY_CARE_PROVIDER_SITE_OTHER): Payer: Medicaid Other

## 2023-07-24 DIAGNOSIS — Z23 Encounter for immunization: Secondary | ICD-10-CM

## 2023-07-24 DIAGNOSIS — F331 Major depressive disorder, recurrent, moderate: Secondary | ICD-10-CM

## 2023-07-24 NOTE — Progress Notes (Signed)
Tdap given to patient and tolerated well.

## 2023-07-24 NOTE — BH Specialist Note (Unsigned)
[facility] Telephone Follow-up  MRN: [id number] NAME: OZIE LUPE Date: 07/26/23  Start time: Start Time: 0300 End time: Stop Time: 0330 Total time: Total Time in Minutes (Visit): 30 Call number: Visit Number: Additional Visit  Reason for call today:  The patient is a 57 year old male who presented for a collaborative care follow-up, reporting ongoing fatigue and knee pain. He shared that he is still going through a challenging disability process and feels discouraged by it. However, he is feeling positive about an upcoming psychiatry appointment scheduled for next week. The patient and the behavioral counselor have been working together for about two months, but there has been little improvement in his PHQ-9 and GAD-7 scores. Despite this, the patient reports benefiting from collaborative care, particularly from having someone to talk to during the sessions.  Given the lack of significant mood improvement, the plan is to connect the patient to traditional therapy and psychiatry services for more comprehensive care. However, the patient enjoys attending the sessions and feels they have been helpful. Therefore, the behavioral counselor will continue to offer support and monitor his symptoms until he is fully connected with psychiatry and therapy. Once those connections are in place, the care team will sign off on the case. For now, we will continue to provide support during the transition.  PHQ-9 Scores:     07/24/2023    3:18 PM 07/23/2023   11:09 AM 07/10/2023    2:36 PM 06/26/2023    2:36 PM 06/12/2023    3:47 PM  Depression screen PHQ 2/9  Decreased Interest 1 1 2 1 1   Down, Depressed, Hopeless 2 2 2 2 2   PHQ - 2 Score 3 3 4 3 3   Altered sleeping 2 2 2 2 1   Tired, decreased energy 1 2 2 1 1   Change in appetite 1 0 1 1 1   Feeling bad or failure about yourself  2 2 3 2 2   Trouble concentrating 2 1 2 2 1   Moving slowly or fidgety/restless 1 2 3 1 2   Suicidal thoughts 0 0 1 0  1  PHQ-9 Score 12 12 18 12 12   Difficult doing work/chores Somewhat difficult Very difficult Somewhat difficult Somewhat difficult    GAD-7 Scores:     07/24/2023    3:21 PM 07/23/2023   11:10 AM 07/10/2023    2:46 PM 06/26/2023    2:38 PM  GAD 7 : Generalized Anxiety Score  Nervous, Anxious, on Edge 2 2 2 2   Control/stop worrying 1 2 2 1   Worry too much - different things 2 2 2 2   Trouble relaxing 2 2 3 2   Restless 2 2 3 1   Easily annoyed or irritable 2 1 3 2   Afraid - awful might happen 1 2 3 1   Total GAD 7 Score 12 13 18 11   Anxiety Difficulty Somewhat difficult Somewhat difficult Very difficult Somewhat difficult    Stress Current stressors:  Financial Sleep:  fair Appetite:  good Coping ability:  good Patient taking medications as prescribed:  Yes  Current medications:  Outpatient Encounter Medications as of 07/24/2023  Medication Sig   DULoxetine (CYMBALTA) 30 MG capsule Take 3 capsules (90 mg total) by mouth daily. **new dose   ibuprofen (ADVIL) 400 MG tablet Take 1 tablet (400 mg total) by mouth every 8 (eight) hours as needed for moderate pain or mild pain.   pregabalin (LYRICA) 100 MG capsule Take 100 mg by mouth at bedtime.   tiZANidine (ZANAFLEX)  2 MG tablet Take 2 mg by mouth daily.   No facility-administered encounter medications on file as of 07/24/2023.     Self-harm Behaviors Risk Assessment Self-harm risk factors:  Past thoughts, depression, anxiety, financial issues, unemployed Patient endorses recent thoughts of harming self:  Denies   Psychologist, occupational Health from 07/24/2023 in [facility] from 07/10/2023 in [facility] from 06/26/2023 in [facility]  C-SSRS RISK CATEGORY No Risk No Risk No Risk       Danger to Others Risk Assessment Danger to others risk factors:   None Patient endorses recent thoughts of harming others:  Denies   Substance Use Assessment Patient recently consumed alcohol:  Yes  Alcohol Use Disorder Identification Test (AUDIT):     05/07/2023    1:53 PM  Alcohol Use Disorder Test (AUDIT)  1. How often do you have a drink containing alcohol? 2  2. How many drinks containing alcohol do you have on a typical day when you are drinking? 0  3. How often do you have six or more drinks on one occasion? 0  AUDIT-C Score 2    Goals, Interventions and Follow-up Plan Goals: Increase healthy adjustment to current life circumstances Interventions: Mindfulness or Relaxation Training and CBT Cognitive Behavioral Therapy Follow-up Plan:  2 weeks    [name]

## 2023-07-30 ENCOUNTER — Telehealth (HOSPITAL_COMMUNITY): Payer: Self-pay

## 2023-07-30 NOTE — Telephone Encounter (Signed)
07/31/23 appt by pt

## 2023-07-31 ENCOUNTER — Encounter (HOSPITAL_COMMUNITY): Payer: Self-pay

## 2023-07-31 ENCOUNTER — Ambulatory Visit (HOSPITAL_COMMUNITY): Payer: Medicaid Other | Admitting: Psychiatry

## 2023-08-07 ENCOUNTER — Ambulatory Visit: Payer: Medicaid Other | Admitting: Professional Counselor

## 2023-08-07 DIAGNOSIS — F331 Major depressive disorder, recurrent, moderate: Secondary | ICD-10-CM | POA: Diagnosis not present

## 2023-08-07 NOTE — BH Specialist Note (Signed)
[facility] Telephone Follow-up  MRN: [id number] NAME: Jack [name] Date: 08/07/23  Start time: Start Time: 0300 End time: Stop Time: 0330 Total time: Total Time in Minutes (Visit): 30 Call number: Visit Number: Additional Visit  Reason for call today:  Patient returned for collaborative care folly up. Patient's symptoms have decreased slightly due to being more active reporting he is spending more time with his ex girlfriend and doing more outings with her. He reports having fun and connecting with her. He is also more optimistic about his disability claim. Patient connected with psychatry and will be following up with them. Patient is being referred to traditional therapy as he will need longer term traditional therapy. He engaged in 8 sessions and made some progress but still struggles day to day. This prompted the referral to psychiatry to determine if there is more complicated diagnosis or if he needs medication adjustments. Patient agreed to this referral.  PHQ-9 Scores:     08/07/2023    3:03 PM 07/24/2023    3:18 PM 07/23/2023   11:09 AM 07/10/2023    2:36 PM 06/26/2023    2:36 PM  Depression screen PHQ 2/9  Decreased Interest 1 1 1 2 1   Down, Depressed, Hopeless 1 2 2 2 2   PHQ - 2 Score 2 3 3 4 3   Altered sleeping 0 2 2 2 2   Tired, decreased energy 2 1 2 2 1   Change in appetite 1 1 0 1 1  Feeling bad or failure about yourself  2 2 2 3 2   Trouble concentrating 0 2 1 2 2   Moving slowly or fidgety/restless 2 1 2 3 1   Suicidal thoughts 0 0 0 1 0  PHQ-9 Score 9 12 12 18 12   Difficult doing work/chores Somewhat difficult Somewhat difficult Very difficult Somewhat difficult Somewhat difficult   GAD-7 Scores:     08/07/2023    3:10 PM 07/24/2023    3:21 PM 07/23/2023   11:10 AM 07/10/2023    2:46 PM  GAD 7 : Generalized Anxiety Score  Nervous, Anxious, on Edge 2 2 2 2   Control/stop worrying 1 1 2 2   Worry too much - different things 1 2 2 2   Trouble relaxing 2 2  2 3   Restless 1 2 2 3   Easily annoyed or irritable 2 2 1 3   Afraid - awful might happen 0 1 2 3   Total GAD 7 Score 9 12 13 18   Anxiety Difficulty Somewhat difficult Somewhat difficult Somewhat difficult Very difficult    Stress Current stressors:  Financial stress Sleep:  Fair Appetite:  Fair Coping ability:  Fair Patient taking medications as prescribed:  Yes  Current medications:  Outpatient Encounter Medications as of 08/07/2023  Medication Sig   DULoxetine (CYMBALTA) 30 MG capsule Take 3 capsules (90 mg total) by mouth daily. **new dose   ibuprofen (ADVIL) 400 MG tablet Take 1 tablet (400 mg total) by mouth every 8 (eight) hours as needed for moderate pain or mild pain.   pregabalin (LYRICA) 100 MG capsule Take 100 mg by mouth at bedtime.   tiZANidine (ZANAFLEX) 2 MG tablet Take 2 mg by mouth daily.   No facility-administered encounter medications on file as of 08/07/2023.     Self-harm Behaviors Risk Assessment Self-harm risk factors:   Patient endorses recent thoughts of harming self:    [place] Suicide Severity Rating Scale: Failed to redirect to the Timeline version of the REVFS SmartLink.   Danger to  Others Risk Assessment Danger to others risk factors:   Patient endorses recent thoughts of harming others:    Dynamic Appraisal of Situational Aggression (DASA):      No data to display           Substance Use Assessment Patient recently consumed alcohol:    Alcohol Use Disorder Identification Test (AUDIT):     05/07/2023    1:53 PM  Alcohol Use Disorder Test (AUDIT)  1. How often do you have a drink containing alcohol? 2  2. How many drinks containing alcohol do you have on a typical day when you are drinking? 0  3. How often do you have six or more drinks on one occasion? 0  AUDIT-C Score 2    Goals, Interventions and Follow-up Plan Goals: Increase healthy adjustment to current life circumstances Interventions: Behavioral Activation and CBT Cognitive  Behavioral Therapy Follow-up Plan: Refer to [facility] Outpatient Therapy    Jack [name]

## 2023-09-03 ENCOUNTER — Ambulatory Visit (INDEPENDENT_AMBULATORY_CARE_PROVIDER_SITE_OTHER): Payer: Medicaid Other | Admitting: Licensed Clinical Social Worker

## 2023-09-03 ENCOUNTER — Encounter (HOSPITAL_COMMUNITY): Payer: Self-pay

## 2023-09-03 DIAGNOSIS — F4310 Post-traumatic stress disorder, unspecified: Secondary | ICD-10-CM | POA: Diagnosis not present

## 2023-09-03 DIAGNOSIS — F411 Generalized anxiety disorder: Secondary | ICD-10-CM | POA: Insufficient documentation

## 2023-09-03 DIAGNOSIS — F332 Major depressive disorder, recurrent severe without psychotic features: Secondary | ICD-10-CM | POA: Insufficient documentation
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
## 2023-09-11 ENCOUNTER — Other Ambulatory Visit (HOSPITAL_COMMUNITY): Payer: Self-pay | Admitting: Family

## 2023-09-11 ENCOUNTER — Ambulatory Visit (HOSPITAL_COMMUNITY): Payer: Medicaid Other | Admitting: Family

## 2023-09-11 ENCOUNTER — Ambulatory Visit: Payer: Medicaid Other | Admitting: Professional Counselor

## 2023-09-11 DIAGNOSIS — F332 Major depressive disorder, recurrent severe without psychotic features: Secondary | ICD-10-CM

## 2023-09-11 DIAGNOSIS — F411 Generalized anxiety disorder: Secondary | ICD-10-CM

## 2023-09-11 DIAGNOSIS — F4310 Post-traumatic stress disorder, unspecified: Secondary | ICD-10-CM

## 2023-09-11 MED ORDER — SERTRALINE HCL 25 MG PO TABS
ORAL_TABLET | ORAL | 0 refills | Status: DC
Start: 2023-09-11 — End: 2023-09-17
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
## 2023-09-12 ENCOUNTER — Telehealth (HOSPITAL_COMMUNITY): Payer: Self-pay | Admitting: Professional

## 2023-09-14 ENCOUNTER — Ambulatory Visit (HOSPITAL_COMMUNITY): Payer: Medicaid Other | Admitting: Professional

## 2023-09-14 ENCOUNTER — Telehealth (HOSPITAL_COMMUNITY): Payer: Self-pay | Admitting: Professional

## 2023-09-14 DIAGNOSIS — F4310 Post-traumatic stress disorder, unspecified: Secondary | ICD-10-CM

## 2023-09-14 DIAGNOSIS — F419 Anxiety disorder, unspecified: Secondary | ICD-10-CM | POA: Diagnosis not present

## 2023-09-14 DIAGNOSIS — F332 Major depressive disorder, recurrent severe without psychotic features: Secondary | ICD-10-CM | POA: Diagnosis not present

## 2023-09-14 DIAGNOSIS — F411 Generalized anxiety disorder: Secondary | ICD-10-CM

## 2023-09-17 ENCOUNTER — Ambulatory Visit (INDEPENDENT_AMBULATORY_CARE_PROVIDER_SITE_OTHER): Payer: Medicaid Other | Admitting: Licensed Clinical Social Worker

## 2023-09-17 ENCOUNTER — Other Ambulatory Visit (HOSPITAL_COMMUNITY): Payer: Self-pay

## 2023-09-17 DIAGNOSIS — F332 Major depressive disorder, recurrent severe without psychotic features: Secondary | ICD-10-CM

## 2023-09-17 DIAGNOSIS — F411 Generalized anxiety disorder: Secondary | ICD-10-CM

## 2023-09-17 DIAGNOSIS — F4310 Post-traumatic stress disorder, unspecified: Secondary | ICD-10-CM | POA: Diagnosis not present

## 2023-09-17 MED ORDER — SERTRALINE HCL 50 MG PO TABS: 50.0000 mg | ORAL_TABLET | Freq: Every day | ORAL | 0 refills | Status: DC

## 2023-09-17 NOTE — Progress Notes (Signed)
THERAPIST PROGRESS NOTE   Session Date: 09/17/2023  Session Time: 1514 - 1600  Participation Level: Active  Behavioral Response: Casual, Neat, and Well GroomedAlertEuthymic  Type of Therapy: Individual Therapy  Treatment Goals addressed:  - LTG: Reduce frequency, intensity, and duration of depression symptoms so that daily functioning is improved (OP Depression) - LTG: Increase coping skills to manage depression and improve ability to perform daily activities (OP Depression) - LTG: "I want to be able to talk about my past without becoming so agitated and upset" (OP Depression) - STG: Report a decrease in anxiety symptoms as evidenced by an overall reduction in anxiety score by a minimum of 25% on the Generalized Anxiety Disorder Scale (GAD-7) (Anxiety) - STG: Rudi Rummage" will reduce frequency of avoidant behaviors by 50% as evidenced by self-report in therapy sessions (Anxiety) - LTG: "To be able to function and not be so worried about everything" (Anxiety)  ProgressTowards Goals: Not Progressing  Interventions: CBT, Motivational Interviewing, Strength-based, and Supportive  Summary: "Jack [name]" is a 57 y.o. male with past psych history of MDD, PTSD, GAD, presenting for follow-up therapy session in efforts to improve management of depressive and anxious symptoms. Patient actively engaged in session, providing recounts of recent events over the past weekend, sharing specifics surrounding traveling to [place] with son over the weekend and spending time with people close to pt. Revisited previous med man appt and challenges in obtaining prescription from pharmacy, as well as recommendation to participate in PHP in order to best meet pt's immediate MH sxs, prior to transitioning back to OPT in 3 weeks. Actively engaged in completion of PHP paperwork, and development of safety plan in preparation for program beginning on 12/16. Patient responded well to interventions. Patient continues to meet criteria  for MDD, PTSD, and GAD. Patient will continue to benefit from engagement in outpatient therapy due to being the least restrictive service to meet presenting needs.     09/17/2023    3:24 PM 09/03/2023    1:27 PM 08/07/2023    3:10 PM 07/24/2023    3:21 PM  GAD 7 : Generalized Anxiety Score  Nervous, Anxious, on Edge 1 2 2 2   Control/stop worrying 0 2 1 1   Worry too much - different things 1 2 1 2   Trouble relaxing 1 3 2 2   Restless 1 3 1 2   Easily annoyed or irritable 1 2 2 2   Afraid - awful might happen 0 1 0 1  Total GAD 7 Score 5 15 9 12   Anxiety Difficulty Not difficult at all Very difficult Somewhat difficult Somewhat difficult      09/17/2023    3:27 PM 09/14/2023    9:49 AM 09/03/2023    1:23 PM 08/07/2023    3:03 PM 07/24/2023    3:18 PM  Depression screen PHQ 2/9  Decreased Interest 1 1 1 1 1   Down, Depressed, Hopeless 1 2 2 1 2   PHQ - 2 Score 2 3 3 2 3   Altered sleeping 0 1 1 0 2  Tired, decreased energy 0 2 1 2 1   Change in appetite 0 0 0 1 1  Feeling bad or failure about yourself  1 2 2 2 2   Trouble concentrating 1 1 2  0 2  Moving slowly or fidgety/restless 1 1 1 2 1   Suicidal thoughts 0 0 0 0 0  PHQ-9 Score 5 10 10 9 12   Difficult doing work/chores Somewhat difficult Very difficult Very difficult Somewhat difficult Somewhat difficult  Flowsheet Row Counselor from 09/14/2023 in [facility] Counselor from 09/03/2023 in [facility] from 08/07/2023 in [facility]  C-SSRS RISK CATEGORY No Risk No Risk No Risk       Suicidal/Homicidal: No  Therapist Response: Clinician utilized CBT and MI to address presenting stressors and MH sxs. Actively listened to pt's recounts of past weekend events and time spent with close family/friends. Explored alternative tx recommendations and plan to proceed with PHP on 12/16, prior to transitioning  back to OPT on 10/16/23. Supported pt in completion of intake docs and development of safety plan. Therapist provided support and empathy to patient during session.  Plan: Return again in 4 weeks.  Diagnosis:  Encounter Diagnoses  Name Primary?   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  Patient/Guardian was advised Release of Information must be obtained prior to any record release in order to collaborate their care with an outside provider. Patient/Guardian was advised if they have not already done so to contact the registration department to sign all necessary forms in order for [place] to release information regarding their care.   Consent: Patient/Guardian gives verbal consent for treatment and assignment of benefits for services provided during this visit. Patient/Guardian expressed understanding and agreed to proceed.   Leisa Lenz, MSW, LCSW 09/17/2023,  3:17 PM

## 2023-09-19 ENCOUNTER — Telehealth (HOSPITAL_COMMUNITY): Payer: Self-pay | Admitting: Professional
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
## 2023-09-20 ENCOUNTER — Telehealth (HOSPITAL_COMMUNITY): Payer: Self-pay | Admitting: Professional

## 2023-09-24 ENCOUNTER — Ambulatory Visit (INDEPENDENT_AMBULATORY_CARE_PROVIDER_SITE_OTHER): Payer: Medicaid Other | Admitting: Licensed Clinical Social Worker

## 2023-09-24 DIAGNOSIS — F332 Major depressive disorder, recurrent severe without psychotic features: Secondary | ICD-10-CM | POA: Diagnosis not present

## 2023-09-24 DIAGNOSIS — F4310 Post-traumatic stress disorder, unspecified: Secondary | ICD-10-CM

## 2023-09-24 DIAGNOSIS — F411 Generalized anxiety disorder: Secondary | ICD-10-CM
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
## 2023-09-25 ENCOUNTER — Ambulatory Visit (INDEPENDENT_AMBULATORY_CARE_PROVIDER_SITE_OTHER): Payer: Medicaid Other | Admitting: Licensed Clinical Social Worker

## 2023-09-25 DIAGNOSIS — F4310 Post-traumatic stress disorder, unspecified: Secondary | ICD-10-CM

## 2023-09-25 DIAGNOSIS — F332 Major depressive disorder, recurrent severe without psychotic features: Secondary | ICD-10-CM

## 2023-09-25 DIAGNOSIS — F411 Generalized anxiety disorder: Secondary | ICD-10-CM

## 2023-09-26 ENCOUNTER — Ambulatory Visit (INDEPENDENT_AMBULATORY_CARE_PROVIDER_SITE_OTHER): Payer: Medicaid Other | Admitting: Licensed Clinical Social Worker

## 2023-09-26 DIAGNOSIS — F4310 Post-traumatic stress disorder, unspecified: Secondary | ICD-10-CM

## 2023-09-26 DIAGNOSIS — F332 Major depressive disorder, recurrent severe without psychotic features: Secondary | ICD-10-CM

## 2023-09-26 DIAGNOSIS — F411 Generalized anxiety disorder: Secondary | ICD-10-CM

## 2023-09-27 ENCOUNTER — Ambulatory Visit (INDEPENDENT_AMBULATORY_CARE_PROVIDER_SITE_OTHER): Payer: Medicaid Other | Admitting: Licensed Clinical Social Worker

## 2023-09-27 DIAGNOSIS — F332 Major depressive disorder, recurrent severe without psychotic features: Secondary | ICD-10-CM | POA: Diagnosis not present

## 2023-09-28 ENCOUNTER — Ambulatory Visit (INDEPENDENT_AMBULATORY_CARE_PROVIDER_SITE_OTHER): Payer: Medicaid Other | Admitting: Licensed Clinical Social Worker

## 2023-09-28 DIAGNOSIS — F332 Major depressive disorder, recurrent severe without psychotic features: Secondary | ICD-10-CM

## 2023-09-28 DIAGNOSIS — F4310 Post-traumatic stress disorder, unspecified: Secondary | ICD-10-CM

## 2023-09-28 DIAGNOSIS — F411 Generalized anxiety disorder: Secondary | ICD-10-CM

## 2023-10-01 ENCOUNTER — Ambulatory Visit (HOSPITAL_COMMUNITY): Payer: Medicaid Other | Admitting: Licensed Clinical Social Worker

## 2023-10-01 DIAGNOSIS — F332 Major depressive disorder, recurrent severe without psychotic features: Secondary | ICD-10-CM

## 2023-10-01 DIAGNOSIS — F4310 Post-traumatic stress disorder, unspecified: Secondary | ICD-10-CM

## 2023-10-02 ENCOUNTER — Ambulatory Visit (INDEPENDENT_AMBULATORY_CARE_PROVIDER_SITE_OTHER): Payer: Medicaid Other

## 2023-10-02 DIAGNOSIS — F332 Major depressive disorder, recurrent severe without psychotic features: Secondary | ICD-10-CM

## 2023-10-02 DIAGNOSIS — F4310 Post-traumatic stress disorder, unspecified: Secondary | ICD-10-CM

## 2023-10-02 DIAGNOSIS — F411 Generalized anxiety disorder: Secondary | ICD-10-CM

## 2023-10-02 MED ORDER — PRAZOSIN HCL 1 MG PO CAPS: 1.0000 mg | ORAL_CAPSULE | Freq: Every day | ORAL | 2 refills | Status: DC

## 2023-10-02 NOTE — Progress Notes (Signed)
Virtual Visit via Telephone Note  I connected with Jack [name] on 10/05/23 at  9:00 AM EST by telephone and verified that I am speaking with the correct person using two identifiers.  Location: Patient: home Provider: office   I discussed the limitations, risks, security and privacy concerns of performing an evaluation and management service by telephone and the availability of in person appointments. I also discussed with the patient that there may be a patient responsible charge related to this service. The patient expressed understanding and agreed to proceed.   I discussed the assessment and treatment plan with the patient. The patient was provided an opportunity to ask questions and all were answered. The patient agreed with the plan and demonstrated an understanding of the instructions.   The patient was advised to call back or seek an in-person evaluation if the symptoms worsen or if the condition fails to improve as anticipated.  I provided 15 minutes of non-face-to-face time during this encounter.   Oneta Rack, NP  BH MD/PA/NP OP Progress Note  10/05/2023 9:01 AM Jack [name]  MRN:  [id number]  Chief Complaint: " I am doing okay"  HPI: Patient was evaluated telephonically.  He denied any concerns at this visit.  Reports overall his mood has improved since attending the program.  States he has discontinued the Zoloft as requested by previous provider.  He has not started Minipress because it was not at the pharmacy.  He is rating his depression and anxiety 6 out of 10 with 10 being the worst.  States he has a good appetite.  States he is resting well throughout the night.  No concerns related to suicidal or homicidal ideations.  Denies auditory or visual hallucinations.  Jack [name] reports he is eager to start a Minipress due to his posttraumatic stress symptoms, reports medications was not at pharmacy as reported earlier.  Will resend medications and follow-up with  patient.  Support, encouragement and reassurance was provided.  Visit Diagnosis:    ICD-10-CM   1. Severe episode of recurrent major depressive disorder, without psychotic features (HCC)  F33.2     2. PTSD (post-traumatic stress disorder)  F43.10       Past Psychiatric History:   Past Medical History:  Past Medical History:  Diagnosis Date   Anxiety    Arthritis    GERD (gastroesophageal reflux disease)    Headache    occasional    History of kidney stones     Past Surgical History:  Procedure Laterality Date   BIOPSY  04/23/2023   Procedure: BIOPSY;  Surgeon: Lanelle Bal, DO;  [facility];  Service: Endoscopy;;   COLONOSCOPY WITH PROPOFOL N/A 04/23/2023   Procedure: COLONOSCOPY WITH PROPOFOL;  Surgeon: Lanelle Bal, DO;  [facility];  Service: Endoscopy;  Laterality: N/A;  9:00 AM, ASA 3   EXTRACORPOREAL SHOCK WAVE LITHOTRIPSY Right 05/30/2018   Procedure: RIGHT EXTRACORPOREAL SHOCK WAVE LITHOTRIPSY (ESWL);  Surgeon: Crist Fat, MD;  [facility];  Service: Urology;  Laterality: Right;   KNEE ARTHROSCOPY Right    KNEE SURGERY Left    3 arthroscopic,  two ACL reconstuctions   SHOULDER SURGERY Right    dislocation repair with a pin   TONSILLECTOMY AND ADENOIDECTOMY     TOTAL KNEE ARTHROPLASTY Left 07/12/2015   Procedure: TOTAL LEFT  KNEE ARTHROPLASTY;  Surgeon: Ollen Gross, MD;  [facility];  Service: Orthopedics;  Laterality: Left;   tubes in  ears      Family Psychiatric History:   Family History:  Family History  Problem Relation Age of Onset   Arthritis Mother     Social History:  Social History   Socioeconomic History   Marital status: Single    Spouse name: Not on file   Number of children: Not on file   Years of education: Not on file   Highest education level: Not on file  Occupational History   Not on file  Tobacco Use   Smoking status: Never   Smokeless tobacco: Former    Types: Careers information officer   Vaping status: Never Used  Substance and Sexual Activity   Alcohol use: Not Currently    Alcohol/week: 0.0 standard drinks of alcohol    Comment: occasional    Drug use: No   Sexual activity: Not on file  Other Topics Concern   Not on file  Social History Narrative   Not on file   Social Drivers of Health   Financial Resource Strain: Not on file  Food Insecurity: Not on file  Transportation Needs: Not on file  Physical Activity: Not on file  Stress: Not on file  Social Connections: Not on file    Allergies:  Allergies  Allergen Reactions   Tramadol Nausea And Vomiting    Metabolic Disorder Labs: Lab Results  Component Value Date   HGBA1C 5.4 04/18/2023   No results found for: "PROLACTIN" Lab Results  Component Value Date   CHOL 157 04/18/2023   TRIG 133 04/18/2023   HDL 42 04/18/2023   CHOLHDL 3.7 04/18/2023   VLDL 25 05/10/2012   LDLCALC 91 04/18/2023   LDLCALC 70 07/02/2019   Lab Results  Component Value Date   TSH 0.464 07/02/2019   TSH 1.260 02/09/2017    Therapeutic Level Labs: No results found for: "LITHIUM" No results found for: "VALPROATE" No results found for: "CBMZ"  Current Medications: Current Outpatient Medications  Medication Sig Dispense Refill   prazosin (MINIPRESS) 1 MG capsule Take 1 capsule (1 mg total) by mouth at bedtime. 30 capsule 2   DULoxetine (CYMBALTA) 30 MG capsule Take 3 capsules (90 mg total) by mouth daily. **new dose 270 capsule 3   ibuprofen (ADVIL) 400 MG tablet Take 1 tablet (400 mg total) by mouth every 8 (eight) hours as needed for moderate pain or mild pain. 30 tablet 0   pregabalin (LYRICA) 100 MG capsule Take 100 mg by mouth at bedtime.     tiZANidine (ZANAFLEX) 2 MG tablet Take 2 mg by mouth daily.     No current facility-administered medications for this visit.     Musculoskeletal:  Telephonic Psychiatric Specialty Exam: Review of Systems  There were no vitals taken for this visit.There is no  height or weight on file to calculate BMI.  General Appearance: NA  Eye Contact:  NA  Speech:  Clear and Coherent  Volume:  Normal  Mood:  Anxious and Depressed reported  Affect:  NA  Thought Process:  Coherent  Orientation:  Full (Time, Place, and Person)  Thought Content: Logical   Suicidal Thoughts:  No  Homicidal Thoughts:  No  Memory:  Immediate;   Good Recent;   Good  Judgement:  Good  Insight:  Good  Psychomotor Activity:  Normal  Concentration:  Concentration: Good  Recall:  Good  Fund of Knowledge: Good  Language: Good  Akathisia:  No  Handed:  Right  AIMS (if indicated): not done  Assets:  Communication Skills Desire for Improvement  ADL's:  Intact  Cognition: WNL  Sleep:  Good   Screenings: GAD-7    Advertising copywriter from 09/17/2023 in [facility] from 09/03/2023 in [facility] at [facility] from 08/07/2023 in [facility] from 07/24/2023 in [facility] Office Visit from 07/23/2023 in [facility]  Total GAD-7 Score 5 15 9 12 13       PHQ2-9    Flowsheet Row Counselor from 09/17/2023 in [facility] [place] Counselor from 09/14/2023 in [facility] Counselor from 09/03/2023 in [facility] at [facility] from 08/07/2023 in [facility] from 07/24/2023 in [facility]  PHQ-2 Total Score 2 3 3 2 3   PHQ-9 Total Score 5 10 10 9 12       Flowsheet Row Counselor from 09/14/2023 in [facility] Counselor from 09/03/2023 in [facility] at [facility] from 08/07/2023 in [facility]  C-SSRS RISK CATEGORY No Risk No Risk No Risk        Assessment and Plan:  Continue partial hospitalization programming [facility] Recent Minipress 1 mg p.o. every hour nightly Continue Cymbalta 60 mg daily -Discontinued Zoloft previous provider  Collaboration of Care: Collaboration of Care: Medication Management AEB see above adjustments  Patient/Guardian was advised Release of Information must be obtained prior to any record release in order to collaborate their care with an outside provider. Patient/Guardian was advised if they have not already done so to contact the registration department to sign all necessary forms in order for [place] to release information regarding their care.   Consent: Patient/Guardian gives verbal consent for treatment and assignment of benefits for services provided during this visit. Patient/Guardian expressed understanding and agreed to proceed.    Oneta Rack, NP 10/05/2023, 9:01 AM

## 2023-10-04 ENCOUNTER — Ambulatory Visit (INDEPENDENT_AMBULATORY_CARE_PROVIDER_SITE_OTHER): Payer: Medicaid Other

## 2023-10-04 DIAGNOSIS — F4310 Post-traumatic stress disorder, unspecified: Secondary | ICD-10-CM

## 2023-10-04 DIAGNOSIS — F332 Major depressive disorder, recurrent severe without psychotic features: Secondary | ICD-10-CM | POA: Diagnosis not present

## 2023-10-04 DIAGNOSIS — F411 Generalized anxiety disorder: Secondary | ICD-10-CM

## 2023-10-05 ENCOUNTER — Telehealth (HOSPITAL_COMMUNITY): Payer: Self-pay | Admitting: Licensed Clinical Social Worker

## 2023-10-05 ENCOUNTER — Encounter (HOSPITAL_COMMUNITY): Payer: Self-pay

## 2023-10-05 ENCOUNTER — Ambulatory Visit (HOSPITAL_COMMUNITY): Payer: Medicaid Other

## 2023-10-08 ENCOUNTER — Ambulatory Visit (HOSPITAL_COMMUNITY): Payer: Medicaid Other

## 2023-10-08 ENCOUNTER — Telehealth (HOSPITAL_COMMUNITY): Payer: Self-pay | Admitting: Licensed Clinical Social Worker

## 2023-10-08 ENCOUNTER — Encounter (HOSPITAL_COMMUNITY): Payer: Self-pay

## 2023-10-09 ENCOUNTER — Ambulatory Visit (INDEPENDENT_AMBULATORY_CARE_PROVIDER_SITE_OTHER): Payer: Medicaid Other | Admitting: Professional

## 2023-10-09 DIAGNOSIS — F332 Major depressive disorder, recurrent severe without psychotic features: Secondary | ICD-10-CM | POA: Diagnosis not present

## 2023-10-09 DIAGNOSIS — F411 Generalized anxiety disorder: Secondary | ICD-10-CM

## 2023-10-09 DIAGNOSIS — F4310 Post-traumatic stress disorder, unspecified: Secondary | ICD-10-CM

## 2023-10-09 NOTE — Psych (Signed)
Pt arrived to virtual PHP session however struggled with tech issue and was unable to be seen or heard. Pt left to try and resolve the issue. Pt did not return.

## 2023-10-09 NOTE — Psych (Signed)
Virtual Visit via Video Note  I connected with Jack [name] on 09/28/23 at  9:00 AM EST by a video enabled telemedicine application and verified that I am speaking with the correct person using two identifiers.  Location: Patient: patient home Provider: clinical home office   I discussed the limitations of evaluation and management by telemedicine and the availability of in person appointments. The patient expressed understanding and agreed to proceed.  I discussed the assessment and treatment plan with the patient. The patient was provided an opportunity to ask questions and all were answered. The patient agreed with the plan and demonstrated an understanding of the instructions.   The patient was advised to call back or seek an in-person evaluation if the symptoms worsen or if the condition fails to improve as anticipated.  Pt was provided 240 minutes of non-face-to-face time during this encounter.   Donia Guiles, LCSW   [facility] BH PHP THERAPIST PROGRESS NOTE  Jack [name] [id number]  Session Time: 9:00 - 10:00  Participation Level: Active  Behavioral Response: CasualAlertDepressed  Type of Therapy: Group Therapy  Treatment Goals addressed: Coping  Progress Towards Goals: Initial  Interventions: CBT, DBT, Supportive, and Reframing  Summary: Jack [name] is a 57 y.o. male who presents with depression, anxiety, and trauma symptoms Clinician led check-in regarding current stressors and situation, and review of patient completed daily inventory. Clinician utilized active listening and empathetic response and validated patient emotions. Clinician facilitated processing group on pertinent issues.?    Therapist Response:  Patient arrived within time allowed. Patient rates his mood at a 6.5 on a scale of 1-10 with 10 being best. Pt states he feels "frustrated." Pt states he slept 6 broken hours and ate 2x. Pt reports frustrations with technology and struggling to figure out his  phone and the group video session. Pt reports he saw his aunt yesterday and took a nap. Pt reports difficulty thinking about the future due to all the changes he is experiencing. Patient able to process. Patient engaged in discussion.          Session Time: 10:00 am - 11:00 am   Participation Level: Active   Behavioral Response: CasualAlertDepressed   Type of Therapy: Group Therapy   Treatment Goals addressed: Coping   Progress Towards Goals: Progressing   Interventions: CBT, DBT, Solution Focused, Strength-based, Supportive, and Reframing   Summary: Cln led discussion on ways to manage stressors and feelings over the weekend. Group members  brainstormed things to do over the weekend for multiple levels of energy, access, and moods. Cln reviewed crisis services should they be needed and provided pt's with the text crisis line, mobile crisis, national suicide hotline, [facility] 24/7 line, and information on [facility].      Therapist Response: Pt engaged in discussion and is able to identify 3 ideas of what to do over the weekend to keep their mind engaged.            Session Time: 11:00 -12:00   Participation Level: Active   Behavioral Response: CasualAlertDepressed   Type of Therapy: Group Therapy   Treatment Goals addressed: Coping   Progress Towards Goals: Progressing   Interventions: CBT, DBT, Solution Focused, Strength-based, Supportive, and Reframing   Summary: Cln introduced grounding technique of 132[address] and led practice with the group. Cln discussed the ways in which grounding can be helpful with different feelings and how to utilize the skill.    Therapist Response:  Pt engaged in practice and  reports understanding of skill.            Session Time: 12:00 -1:00   Participation Level: Active   Behavioral Response: CasualAlertDepressed   Type of Therapy: Group therapy   Treatment Goals addressed: Coping   Progress Towards Goals: Progressing    Interventions: OT group   Summary: 12:00 - 12:50: Occupational Therapy group with cln E. Hollan.  12:50 - 1:00 Clinician assessed for immediate needs, medication compliance and efficacy, and safety concerns.   Therapist Response: 12:00 - 12:50: Pt participated 12:50 - 1:00 pm: At check-out, patient reports no immediate concerns. Patient demonstrates progress as evidenced by continued engagement and responsiveness to treatment. Patient denies SI/HI/self-harm thoughts at the end of group.   Suicidal/Homicidal: Nowithout intent/plan  Plan: Pt will continue in PHP while working to decrease depression, anxiety, and trauma symptoms, increase emotion regulation, and increase ability to manage symptoms in a healthy manner.   Collaboration of Care: Medication Management AEB T Lewis, NP  Patient/Guardian was advised Release of Information must be obtained prior to any record release in order to collaborate their care with an outside provider. Patient/Guardian was advised if they have not already done so to contact the registration department to sign all necessary forms in order for [place] to release information regarding their care.   Consent: Patient/Guardian gives verbal consent for treatment and assignment of benefits for services provided during this visit. Patient/Guardian expressed understanding and agreed to proceed.   Diagnosis: Severe episode of recurrent major depressive disorder, without psychotic features (HCC) [F33.2]    1. Severe episode of recurrent major depressive disorder, without psychotic features (HCC)   2. PTSD (post-traumatic stress disorder)   3. GAD (generalized anxiety disorder)       Donia Guiles, LCSW

## 2023-10-09 NOTE — Progress Notes (Signed)
 Virtual Visit via Video Note  I connected with Jack [name] on 10/09/23 at  9:00 AM EST by a video enabled telemedicine application and verified that I am speaking with the correct person using two identifiers.  Location: Patient: Home Provider: office    I discussed the limitations of evaluation and management by telemedicine and the availability of in person appointments. The patient expressed understanding and agreed to proceed.   I discussed the assessment and treatment plan with the patient. The patient was provided an opportunity to ask questions and all were answered. The patient agreed with the plan and demonstrated an understanding of the instructions.   The patient was advised to call back or seek an in-person evaluation if the symptoms worsen or if the condition fails to improve as anticipated.  I provided 15 minutes of non-face-to-face time during this encounter.   Staci LOISE Kerns, NP   [facility] MD/PA/NP OP Progress Note  10/09/2023 3:49 PM KHYE HOCHSTETLER  MRN:  [id number]  Chief Complaint:  Chief Complaint  Patient presents with   Depression   Anxiety   HPI: Jack [name] seen and evaluated via telephonically.  He denied any concerns at this visit.  Denying any suicidal or homicidal ideations.  Denies auditory or visual hallucinations.  Reports he has been compliant with medication declined needing any medication refills at this visit.  Patient is rating his depression and anxiety 5 out of 10 with 10 being the worst.  He reports a good appetite.    Jack [name] states he is resting well throughout the night.  Does report some concerns related to Internet connection due to the area to which he lives in.  However, states overall group has been helpful and has learned some coping skills as he is able to take notes.   Per admission assessment note: Jack [name] 57 year old Caucasian male presents to establish care.  Patient was referred by integrative therapy services [facility].   Patient reports dealing with multiple stressors related to his worsening depression and anxiety symptoms. Coby carries a diagnosis with major depressive disorder, generalized anxiety disorder and posttraumatic stress disorder.    Visit Diagnosis:    ICD-10-CM   1. Severe episode of recurrent major depressive disorder, without psychotic features (HCC)  F33.2     2. PTSD (post-traumatic stress disorder)  F43.10     3. GAD (generalized anxiety disorder)  F41.1       Past Psychiatric History:   Past Medical History:  Past Medical History:  Diagnosis Date   Anxiety    Arthritis    GERD (gastroesophageal reflux disease)    Headache    occasional    History of kidney stones     Past Surgical History:  Procedure Laterality Date   BIOPSY  04/23/2023   Procedure: BIOPSY;  Surgeon: Cindie Carlin POUR, DO;  [facility]: [facility];  Service: Endoscopy;;   COLONOSCOPY WITH PROPOFOL  N/A 04/23/2023   Procedure: COLONOSCOPY WITH PROPOFOL ;  Surgeon: Cindie Carlin POUR, DO;  Location: [facility];  Service: Endoscopy;  Laterality: N/A;  9:00 AM, ASA 3   EXTRACORPOREAL SHOCK WAVE LITHOTRIPSY Right 05/30/2018   Procedure: RIGHT EXTRACORPOREAL SHOCK WAVE LITHOTRIPSY (ESWL);  Surgeon: Cam Morene ORN, MD;  Location: [facility];  Service: Urology;  Laterality: Right;   KNEE ARTHROSCOPY Right    KNEE SURGERY Left    3 arthroscopic,  two ACL reconstuctions   SHOULDER SURGERY Right    dislocation repair with a pin   TONSILLECTOMY AND  ADENOIDECTOMY     TOTAL KNEE ARTHROPLASTY Left 07/12/2015   Procedure: TOTAL LEFT  KNEE ARTHROPLASTY;  Surgeon: Dempsey Moan, MD;  Location: [facility];  Service: Orthopedics;  Laterality: Left;   tubes in ears      Family Psychiatric History:   Family History:  Family History  Problem Relation Age of Onset   Arthritis Mother     Social History:  Social History   Socioeconomic History   Marital status: Single    Spouse name: Not on file   Number of  children: Not on file   Years of education: Not on file   Highest education level: Not on file  Occupational History   Not on file  Tobacco Use   Smoking status: Never   Smokeless tobacco: Former    Types: Engineer, Drilling   Vaping status: Never Used  Substance and Sexual Activity   Alcohol use: Not Currently    Alcohol/week: 0.0 standard drinks of alcohol    Comment: occasional    Drug use: No   Sexual activity: Not on file  Other Topics Concern   Not on file  Social History Narrative   Not on file   Social Drivers of Health   Financial Resource Strain: Not on file  Food Insecurity: Not on file  Transportation Needs: Not on file  Physical Activity: Not on file  Stress: Not on file  Social Connections: Not on file    Allergies:  Allergies  Allergen Reactions   Tramadol  Nausea And Vomiting    Metabolic Disorder Labs: Lab Results  Component Value Date   HGBA1C 5.4 04/18/2023   No results found for: PROLACTIN Lab Results  Component Value Date   CHOL 157 04/18/2023   TRIG 133 04/18/2023   HDL 42 04/18/2023   CHOLHDL 3.7 04/18/2023   VLDL 25 05/10/2012   LDLCALC 91 04/18/2023   LDLCALC 70 07/02/2019   Lab Results  Component Value Date   TSH 0.464 07/02/2019   TSH 1.260 02/09/2017    Therapeutic Level Labs: No results found for: LITHIUM No results found for: VALPROATE No results found for: CBMZ  Current Medications: Current Outpatient Medications  Medication Sig Dispense Refill   DULoxetine  (CYMBALTA ) 30 MG capsule Take 3 capsules (90 mg total) by mouth daily. **new dose 270 capsule 3   ibuprofen  (ADVIL ) 400 MG tablet Take 1 tablet (400 mg total) by mouth every 8 (eight) hours as needed for moderate pain or mild pain. 30 tablet 0   prazosin  (MINIPRESS ) 1 MG capsule Take 1 capsule (1 mg total) by mouth at bedtime. 30 capsule 2   pregabalin (LYRICA) 100 MG capsule Take 100 mg by mouth at bedtime.     tiZANidine  (ZANAFLEX ) 2 MG tablet Take 2 mg  by mouth daily.     No current facility-administered medications for this visit.     Musculoskeletal:   Psychiatric Specialty Exam: Review of Systems  Psychiatric/Behavioral:  Negative for decreased concentration and sleep disturbance. The patient is nervous/anxious.   All other systems reviewed and are negative.   There were no vitals taken for this visit.There is no height or weight on file to calculate BMI.  General Appearance: Casual  Eye Contact:  Good  Speech:  Clear and Coherent  Volume:  Normal  Mood:  Anxious and Depressed  Affect:  Congruent  Thought Process:  Coherent  Orientation:  Full (Time, Place, and Person)  Thought Content: Logical   Suicidal Thoughts:  No  Homicidal  Thoughts:  No  Memory:  Immediate;   Good Recent;   Good  Judgement:  Good  Insight:  Good  Psychomotor Activity:  Normal  Concentration:  Concentration: Good  Recall:  Good  Fund of Knowledge: Good  Language: Good  Akathisia:  No  Handed:  Right  AIMS (if indicated): done  Assets:  Communication Skills Desire for Improvement Resilience Social Support  ADL's:  Intact  Cognition: WNL  Sleep:  Fair   Screenings: GAD-7    Advertising Copywriter from 09/17/2023 in [facility] [place] Counselor from 09/03/2023 in [facility] from 08/07/2023 in [facility] from 07/24/2023 in [facility] Office Visit from 07/23/2023 in [facility]  Total GAD-7 Score 5 15 9 12 13       PHQ2-9    Flowsheet Row Counselor from 09/17/2023 in [facility] [place] Counselor from 09/14/2023 in [facility] Counselor from 09[facility]03/2023 in [facility]  from 08/07/2023 in [facility] from 07/24/2023 in [facility]  PHQ-2 Total Score 2 3 3 2 3   PHQ-9 Total Score 5 10 10 9 12       Flowsheet Row Counselor from 09/14/2023 in [facility] Counselor from 09/03/2023 in [facility] from 08/07/2023 in [facility]  C-SSRS RISK CATEGORY No Risk No Risk No Risk        Assessment and Plan:  Continue partial hospitalization programming Continue medication as directed. Cymbalta  90 mg and Prazosin  1 mg nightly    Collaboration of Care: Collaboration of Care: Other follow-up with therapy services at discharge.  Patient/Guardian was advised Release of Information must be obtained prior to any record release in order to collaborate their care with an outside provider. Patient/Guardian was advised if they have not already done so to contact the registration department to sign all necessary forms in order for us  to release information regarding their care.   Consent: Patient/Guardian gives verbal consent for treatment and assignment of benefits for services provided during this visit. Patient/Guardian expressed understanding and agreed to proceed.    Staci LOISE Kerns, NP 10/09/2023, 3:49 PM

## 2023-10-09 NOTE — Psych (Signed)
Virtual Visit via Video Note  I connected with Jack [name] on 09/26/23 at  9:00 AM EST by a video enabled telemedicine application and verified that I am speaking with the correct person using two identifiers.  Location: Patient: patient home Provider: clinical home office   I discussed the limitations of evaluation and management by telemedicine and the availability of in person appointments. The patient expressed understanding and agreed to proceed.  I discussed the assessment and treatment plan with the patient. The patient was provided an opportunity to ask questions and all were answered. The patient agreed with the plan and demonstrated an understanding of the instructions.   The patient was advised to call back or seek an in-person evaluation if the symptoms worsen or if the condition fails to improve as anticipated.  Pt was provided 240 minutes of non-face-to-face time during this encounter.   Donia Guiles, LCSW   [facility] BH PHP THERAPIST PROGRESS NOTE  Jack [name] [id number]  Session Time: 9:00 - 10:00  Participation Level: Active  Behavioral Response: CasualAlertDepressed  Type of Therapy: Group Therapy  Treatment Goals addressed: Coping  Progress Towards Goals: Initial  Interventions: CBT, DBT, Supportive, and Reframing  Summary: Jack [name] is a 57 y.o. male who presents with depression, anxiety, and trauma symptoms Clinician led check-in regarding current stressors and situation, and review of patient completed daily inventory. Clinician utilized active listening and empathetic response and validated patient emotions. Clinician facilitated processing group on pertinent issues.?    Therapist Response:  Patient arrived within time allowed. Patient rates his mood at a 7.5 on a scale of 1-10 with 10 being best. Pt states he feels "depressed." Pt states he slept 5 broken hours and ate 3x. Pt reports he is "hurting bad" today and it is affecting his mood  negatively. Pt states he found out the woman he is dating has a mass on her breast and is scared for her. Pt states frustration figuring out the issue with his phone that impaired him in group yesterday, however it seems to be working now.  Patient able to process. Patient engaged in discussion.          Session Time: 10:00 am - 11:00 am   Participation Level: Active   Behavioral Response: CasualAlertDepressed   Type of Therapy: Group Therapy   Treatment Goals addressed: Coping   Progress Towards Goals: Progressing   Interventions: CBT, DBT, Solution Focused, Strength-based, Supportive, and Reframing   Summary:  Cln led processing group for pt's current struggles. Group members shared stressors and provided support and feedback. Cln brought in topics of boundaries, healthy relationships, and unhealthy thought processes to inform discussion.    Therapist Response:  Pt able to process and provide support to group.          Session Time: 11:00 -12:00   Participation Level: Active   Behavioral Response: CasualAlertDepressed   Type of Therapy: Group Therapy   Treatment Goals addressed: Coping   Progress Towards Goals: Progressing   Interventions: Strength-based, Supportive, and Reframing   Summary: Chaplaincy group with K. Claussen   Therapist Response: Pt participated and engaged in discussion.            Session Time: 12:00 -1:00   Participation Level: Active   Behavioral Response: CasualAlertDepressed   Type of Therapy: Group therapy   Treatment Goals addressed: Coping   Progress Towards Goals: Progressing   Interventions: OT group   Summary: 12:00 - 12:50: Occupational Therapy group with cln  E. Hollan.  12:50 - 1:00 Clinician assessed for immediate needs, medication compliance and efficacy, and safety concerns.   Therapist Response: 12:00 - 12:50: Pt participated 12:50 - 1:00 pm: At check-out, patient reports no immediate concerns. Patient demonstrates  progress as evidenced by continued engagement and responsiveness to treatment. Patient denies SI/HI/self-harm thoughts at the end of group.   Suicidal/Homicidal: Nowithout intent/plan  Plan: Pt will continue in PHP while working to decrease depression, anxiety, and trauma symptoms, increase emotion regulation, and increase ability to manage symptoms in a healthy manner.   Collaboration of Care: Medication Management AEB T Lewis, NP  Patient/Guardian was advised Release of Information must be obtained prior to any record release in order to collaborate their care with an outside provider. Patient/Guardian was advised if they have not already done so to contact the registration department to sign all necessary forms in order for [place] to release information regarding their care.   Consent: Patient/Guardian gives verbal consent for treatment and assignment of benefits for services provided during this visit. Patient/Guardian expressed understanding and agreed to proceed.   Diagnosis: Severe episode of recurrent major depressive disorder, without psychotic features (HCC) [F33.2]    1. Severe episode of recurrent major depressive disorder, without psychotic features (HCC)   2. PTSD (post-traumatic stress disorder)   3. GAD (generalized anxiety disorder)       Donia Guiles, LCSW

## 2023-10-09 NOTE — Psych (Signed)
 Virtual Visit via Video Note  I connected with Jack [name] on 10/09/23 at  9:00 AM EST by a video enabled telemedicine application and verified that I am speaking with the correct person using two identifiers.  Location: Patient: Home Provider: Clinical Home Office   I discussed the limitations of evaluation and management by telemedicine and the availability of in person appointments. The patient expressed understanding and agreed to proceed.  Follow Up Instructions:    I discussed the assessment and treatment plan with the patient. The patient was provided an opportunity to ask questions and all were answered. The patient agreed with the plan and demonstrated an understanding of the instructions.   The patient was advised to call back or seek an in-person evaluation if the symptoms worsen or if the condition fails to improve as anticipated.  I provided 240 minutes of non-face-to-face time during this encounter.   Benton JINNY Devoid, [facility]   [facility] BH PHP THERAPIST PROGRESS NOTE  Jack [name] [id number]  Session Time: 9:00-10:00  Participation Level: Active  Behavioral Response: CasualAlertDepressed  Type of Therapy: Group Therapy  Treatment Goals addressed: Coping  Progress Towards Goals: Progressing  Interventions: CBT, DBT, Solution Focused, Strength-based, Supportive, and Reframing  Summary: Jack [name] is a 57 y.o. male who presents with depression and anxiety symptoms. Clinician led check-in regarding current stressors and situation, and review of patient completed daily inventory. Clinician utilized active listening and empathetic response and validated patient emotions. Clinician facilitated processing group on pertinent issues.?   Therapist Response: Patient arrived within time allowed. Patient rates his mood at a 5 on a scale of 1-10 with 10 being best. Pt states he feels a little tired. Pt states he slept 5 hours and ate 2x. Pt reports he has had connection  issues which has been frustrating the last few days. He reports he is still dealing with the ups and downs of the holidays. He spent time with his parents and a few of his children yesterday. Patient able to process. Patient engaged in discussion.   Session Time: 10:00 am - 11:00 am   Participation Level: Active   Behavioral Response: CasualAlertDepressed   Type of Therapy: Group Therapy   Treatment Goals addressed: Coping   Progress Towards Goals: Progressing   Interventions: CBT, DBT, Solution Focused, Strength-based, Supportive, and Reframing   Therapist Response: Cln led discussion on stigma and how stigma can affect the way others see pt's getting mental health treatment. Group discussed medication hesitancy and finding providers they connect with in order to have open communication related to medications. Group discussed codependency and the importance of setting and maintaining boundaries to work to become less codependent. Cln explained spoon theory and how this can help pts communicate with others what they have to offer each day or hour. Therapist Response: Pt able to process and provide support to group. Pt reports he identifies with ruminations about what others say about his mental health putting him into a bad mood.         Session Time: 11:00 -12:00   Participation Level: Active   Behavioral Response: CasualAlertDepressed   Type of Therapy: Group Therapy   Treatment Goals addressed: Coping   Progress Towards Goals: Progressing   Interventions: CBT, DBT, Solution Focused, Strength-based, Supportive, and Reframing   Summary: Clinician introduced topic of Mindfulness." Group watched TedTalk "Don't try to be Mindful" and discussed. Cln discussed why mindfulness is important and pts identified where they can practice mindfulness in their  everyday lives and set ways to practice mindfulness like doing puzzles, coloring, arts and crafts, Transport Planner, etc.     Therapist Response: Pt engaged in discussion. Pt engaged in discussion. Pt identified 2 activities they do on autopilot to start practicing mindfulness: brushing teeth and making the bed. They identified an unpleasant autopilot activity of doing laundry to practice mindfulness during more stressful/unpleasant moments.       Session Time: 12:00 -1:00   Participation Level: Active   Behavioral Response: CasualAlertDepressed   Type of Therapy: Group therapy   Treatment Goals addressed: Coping   Progress Towards Goals: Progressing   Interventions: OT group   Summary: 12:00 - 12:50: Occupational Therapy group with cln E. Hollan.  12:50 - 1:00 Clinician assessed for immediate needs, medication compliance and efficacy, and safety concerns.   Therapist Response: 12:00 - 12:50: Pt engaged in discussion. 12:50 - 1:00 pm: At check-out, patient reports no immediate concerns. Patient demonstrates progress as evidenced by continued engagement and responsiveness to treatment. Patient denies SI/HI/self-harm thoughts at the end of group.  Suicidal/Homicidal: Nowithout intent/plan    Plan: Pt will continue in PHP while working to decrease depression and anxiety symptoms, increase emotion regulation, and increase ability to manage symptoms in a healthy manner.   Collaboration of Care: Medication Management AEB Staci Kerns  Patient/Guardian was advised Release of Information must be obtained prior to any record release in order to collaborate their care with an outside provider. Patient/Guardian was advised if they have not already done so to contact the registration department to sign all necessary forms in order for us  to release information regarding their care.   Consent: Patient/Guardian gives verbal consent for treatment and assignment of benefits for services provided during this visit. Patient/Guardian expressed understanding and agreed to proceed.   Diagnosis: Severe episode of recurrent  major depressive disorder, without psychotic features (HCC) [F33.2]    1. Severe episode of recurrent major depressive disorder, without psychotic features (HCC)   2. PTSD (post-traumatic stress disorder)   3. GAD (generalized anxiety disorder)       Benton JINNY Devoid, [facility] 10/09/2023

## 2023-10-11 ENCOUNTER — Ambulatory Visit (HOSPITAL_COMMUNITY): Payer: Medicaid Other | Admitting: Licensed Clinical Social Worker

## 2023-10-11 DIAGNOSIS — F332 Major depressive disorder, recurrent severe without psychotic features: Secondary | ICD-10-CM

## 2023-10-11 DIAGNOSIS — F4310 Post-traumatic stress disorder, unspecified: Secondary | ICD-10-CM

## 2023-10-12 ENCOUNTER — Ambulatory Visit (INDEPENDENT_AMBULATORY_CARE_PROVIDER_SITE_OTHER): Payer: Medicaid Other | Admitting: Licensed Clinical Social Worker

## 2023-10-12 DIAGNOSIS — F4310 Post-traumatic stress disorder, unspecified: Secondary | ICD-10-CM

## 2023-10-12 DIAGNOSIS — F332 Major depressive disorder, recurrent severe without psychotic features: Secondary | ICD-10-CM | POA: Diagnosis not present

## 2023-10-12 NOTE — Progress Notes (Addendum)
Virtual Visit via Telephone Note  I connected with Jack [name] on 10/12/23 at  9:00 AM EST by telephone and verified that I am speaking with the correct person using two identifiers.  Location: Patient: Home Provider: Office   I discussed the limitations, risks, security and privacy concerns of performing an evaluation and management service by telephone and the availability of in person appointments. I also discussed with the patient that there may be a patient responsible charge related to this service. The patient expressed understanding and agreed to proceed.    I discussed the assessment and treatment plan with the patient. The patient was provided an opportunity to ask questions and all were answered. The patient agreed with the plan and demonstrated an understanding of the instructions.   The patient was advised to call back or seek an in-person evaluation if the symptoms worsen or if the condition fails to improve as anticipated.  I provided 15 minutes of non-face-to-face time during this encounter.   Staci LOISE Kerns, NP    Health Partial Hospitalization Outpatient Program- [facility]  Discharge Summary  Jack [name] [id number]  Admission date: 09/11/2023 Discharge date:  10/11/2022  Reason for admission: Per admission assessment note: Jack [name] 58 year old Caucasian male presents to establish care.  Patient was referred by integrative therapy services Jack [name].  Patient reports dealing with multiple stressors related to his worsening depression and anxiety symptoms. Jack [name] carries a diagnosis with major depressive disorder, generalized anxiety disorder and posttraumatic stress disorder.  Reported medical diagnoses related to sciatic nerve pain, back pain/stenosis, left knee pain with  limited mobility.  He reports chronic pain, to which she has applied for disability.  States he is unable to hold a job due to his medical history with is back pain.       Progress in Program Toward Treatment Goals: Progressing patient attended and participated in daily group session with active and engaged participation.  Denying suicidal or homicidal ideations.  Denies auditory visual hallucinations.  Reports he has been compliant with medications since attending partial hospitalization programming.  Denied any medication refills at this time.  Progress (rationale): Take all of you medications as prescribed by your mental healthcare provider.  Report any adverse effects and reactions from your medications to your outpatient provider promptly.  Do not engage in alcohol and or illegal drug use while on prescription medicines. Keep all scheduled appointments. This is to ensure that you are getting refills on time and to avoid any interruption in your medication.  If you are unable to keep an appointment call to reschedule.  Be sure to follow up with resources and follow ups given. In the event of worsening symptoms call the crisis hotline, 911, and or go to the nearest emergency department for appropriate evaluation and treatment of symptoms. Follow-up with your primary care provider for your medical issues, concerns and or health care needs.    Collaboration of Care: Other therapist Jack [name], Nurse Practitioner Jack [name]  Patient/Guardian was advised Release of Information must be obtained prior to any record release in order to collaborate their care with an outside provider. Patient/Guardian was advised if they have not already done so to contact the registration department to sign all necessary forms in order for us  to release information regarding their care.   Consent: Patient/Guardian gives verbal consent for treatment and assignment of benefits for services provided during this visit. Patient/Guardian expressed understanding and agreed to proceed.   Staci Kerns NP  10/12/2023 

## 2023-10-12 NOTE — Psych (Signed)
 Virtual Visit via Video Note  I connected with Jack [name] on 10/11/2023 at  9:00 AM EST by a video enabled telemedicine application and verified that I am speaking with the correct person using two identifiers.  Location: Patient: patient home Provider: clinical home office   I discussed the limitations of evaluation and management by telemedicine and the availability of in person appointments. The patient expressed understanding and agreed to proceed.  I discussed the assessment and treatment plan with the patient. The patient was provided an opportunity to ask questions and all were answered. The patient agreed with the plan and demonstrated an understanding of the instructions.   The patient was advised to call back or seek an in-person evaluation if the symptoms worsen or if the condition fails to improve as anticipated.  Pt was provided 240 minutes of non-face-to-face time during this encounter.   Randall Bastos, LCSW   [facility] BH PHP THERAPIST PROGRESS NOTE  ELIAZER HEMPHILL [id number]  Session Time: 9:00 - 10:00  Participation Level: Active  Behavioral Response: CasualAlertDepressed  Type of Therapy: Group Therapy  Treatment Goals addressed: Coping  Progress Towards Goals: Progressing  Interventions: CBT, DBT, Supportive, and Reframing  Summary: Jack [name] is a 58 y.o. male who presents with depression, anxiety, and trauma symptoms Clinician led check-in regarding current stressors and situation, and review of patient completed daily inventory. Clinician utilized active listening and empathetic response and validated patient emotions. Clinician facilitated processing group on pertinent issues.?    Therapist Response:  Patient arrived within time allowed. Patient rates his mood at a 5 on a scale of 1-10 with 10 being best. Pt states he feels alright. Pt states he slept 3.5 broken hours and ate 1x. Pt reports he visited an acquaintance who is dealing with terminal  prostate cancer. Pt states he was glad to visit with him however it was difficult and pt was ina contemplative mood for the rest of the day. Pt states he felt emotionally and physically wiped out after the visit. Pt struggles with protecting his energy. Patient able to process. Patient engaged in discussion.          Session Time: 10:00 am - 11:00 am   Participation Level: Active   Behavioral Response: CasualAlertDepressed   Type of Therapy: Group Therapy   Treatment Goals addressed: Coping   Progress Towards Goals: Progressing   Interventions: CBT, DBT, Solution Focused, Strength-based, Supportive, and Reframing   Summary: Cln led processing group for pt's current struggles. Group members shared stressors and provided support and feedback. Cln brought in topics of boundaries, healthy relationships, and unhealthy thought processes to inform discussion.    Therapist Response: Pt able to process and provide support to group.            Session Time: 11:00 -12:00   Participation Level: Active   Behavioral Response: CasualAlertDepressed   Type of Therapy: Group Therapy   Treatment Goals addressed: Coping   Progress Towards Goals: Progressing   Interventions: CBT, DBT, Solution Focused, Strength-based, Supportive, and Reframing   Summary: Cln led discussion on anger and the way it impacts us . Cln encouraged pt's to view anger as a warning from our bodies that something is not right. Cln utilized the anger iceberg to discuss the ways in which anger may be masking other feelings that are more difficult to express. Group discussed how they experience anger and what issues it has caused.    Therapist Response:  Pt engaged in discussion and  reports understanding.            Session Time: 12:00 -1:00   Participation Level: Active   Behavioral Response: CasualAlertDepressed   Type of Therapy: Group therapy   Treatment Goals addressed: Coping   Progress Towards Goals:  Progressing   Interventions: OT group   Summary: 12:00 - 12:50: Occupational Therapy group with cln E. Hollan.  12:50 - 1:00 Clinician assessed for immediate needs, medication compliance and efficacy, and safety concerns.   Therapist Response: 12:00 - 12:50: Pt participated 12:50 - 1:00 pm: At check-out, patient reports no immediate concerns. Patient demonstrates progress as evidenced by continued engagement and responsiveness to treatment. Patient denies SI/HI/self-harm thoughts at the end of group.   Suicidal/Homicidal: Nowithout intent/plan  Plan: Pt will continue in PHP while working to decrease depression, anxiety, and trauma symptoms, increase emotion regulation, and increase ability to manage symptoms in a healthy manner.   Collaboration of Care: Medication Management AEB T Lewis, NP  Patient/Guardian was advised Release of Information must be obtained prior to any record release in order to collaborate their care with an outside provider. Patient/Guardian was advised if they have not already done so to contact the registration department to sign all necessary forms in order for us  to release information regarding their care.   Consent: Patient/Guardian gives verbal consent for treatment and assignment of benefits for services provided during this visit. Patient/Guardian expressed understanding and agreed to proceed.   Diagnosis: Severe episode of recurrent major depressive disorder, without psychotic features (HCC) [F33.2]    1. Severe episode of recurrent major depressive disorder, without psychotic features (HCC)   2. PTSD (post-traumatic stress disorder)       Randall Bastos, LCSW

## 2023-10-14 ENCOUNTER — Other Ambulatory Visit (HOSPITAL_COMMUNITY): Payer: Self-pay | Admitting: Family

## 2023-10-15 ENCOUNTER — Telehealth (HOSPITAL_COMMUNITY): Payer: Medicaid Other | Admitting: Family

## 2023-10-16 ENCOUNTER — Ambulatory Visit (INDEPENDENT_AMBULATORY_CARE_PROVIDER_SITE_OTHER): Payer: Medicaid Other | Admitting: Licensed Clinical Social Worker

## 2023-10-16 ENCOUNTER — Ambulatory Visit (HOSPITAL_COMMUNITY): Payer: Medicaid Other | Admitting: Licensed Clinical Social Worker

## 2023-10-16 ENCOUNTER — Encounter (HOSPITAL_COMMUNITY): Payer: Self-pay

## 2023-10-16 ENCOUNTER — Ambulatory Visit (HOSPITAL_COMMUNITY): Payer: Medicaid Other

## 2023-10-16 DIAGNOSIS — Z91199 Patient's noncompliance with other medical treatment and regimen due to unspecified reason: Secondary | ICD-10-CM

## 2023-10-16 NOTE — Progress Notes (Signed)
 THERAPIST PROGRESS NOTE   Session Date: 10/16/2023  Session Time: 1400  Patient no-showed today's appointment; appointment was for follow up therapy. Pt did not provider prior notification of needing to cancel/reschedule today's appt.    [name], MSW, LCSW 10/16/2023,  4:38 PM

## 2023-10-17 ENCOUNTER — Ambulatory Visit (HOSPITAL_COMMUNITY): Payer: Medicaid Other

## 2023-10-18 ENCOUNTER — Ambulatory Visit (HOSPITAL_COMMUNITY): Payer: Medicaid Other

## 2023-10-18 NOTE — Psych (Signed)
 Virtual Visit via Video Note  I connected with Jack [name] on 10/12/2023 at  9:00 AM EST by a video enabled telemedicine application and verified that I am speaking with the correct person using two identifiers.  Location: Patient: patient home Provider: clinical home office   I discussed the limitations of evaluation and management by telemedicine and the availability of in person appointments. The patient expressed understanding and agreed to proceed.  I discussed the assessment and treatment plan with the patient. The patient was provided an opportunity to ask questions and all were answered. The patient agreed with the plan and demonstrated an understanding of the instructions.   The patient was advised to call back or seek an in-person evaluation if the symptoms worsen or if the condition fails to improve as anticipated.  Pt was provided 240 minutes of non-face-to-face time during this encounter.   Randall Bastos, LCSW   [facility] BH PHP THERAPIST PROGRESS NOTE  Jack [name] [id number]  Session Time: 9:00 - 10:00  Participation Level: Active  Behavioral Response: CasualAlertDepressed  Type of Therapy: Group Therapy  Treatment Goals addressed: Coping  Progress Towards Goals: Progressing  Interventions: CBT, DBT, Supportive, and Reframing  Summary: Jack [name] is a 58 y.o. male who presents with depression, anxiety, and trauma symptoms Clinician led check-in regarding current stressors and situation, and review of patient completed daily inventory. Clinician utilized active listening and empathetic response and validated patient emotions. Clinician facilitated processing group on pertinent issues.?    Therapist Response:  Patient arrived within time allowed. Patient rates his mood at a 5.5 on a scale of 1-10 with 10 being best. Pt states he feels okay. Pt states he slept 5 broken hours and ate 2x. Pt reports discussing grief yesterday brought up a lot and loss was on  his mind most of the day which affected his mood. Pt is able to identify coping skills he used to manage mood during this time. Patient able to process. Patient engaged in discussion.          Session Time: 10:00 am - 11:00 am   Participation Level: Active   Behavioral Response: CasualAlertDepressed   Type of Therapy: Group Therapy   Treatment Goals addressed: Coping   Progress Towards Goals: Progressing   Interventions: CBT, DBT, Solution Focused, Strength-based, Supportive, and Reframing   Summary: Cln led discussion on the ways our phones can cause barriers to our mental health. Group discussed struggles they have with their phones and the role it can play in unhealthy thought patterns and behaviors. Group worked to problem solve ways to cut down on negative behaviors.    Therapist Response: Pt engaged in discussion and was able to process.            Session Time: 11:00 -12:00   Participation Level: Active   Behavioral Response: CasualAlertDepressed   Type of Therapy: Group Therapy   Treatment Goals addressed: Coping   Progress Towards Goals: Progressing   Interventions: CBT, DBT, Solution Focused, Strength-based, Supportive, and Reframing   Summary: Cln led discussion on CBT thinking error: personalization. Cln drew connection between personalizing thoughts to increased self conciousness and lower self esteem.  Group discussed struggled with personalizing and created reframing statements.    Therapist Response: Pt engaged in discussion and reports understanding of personalization.              Session Time: 12:00 -1:00   Participation Level: Active   Behavioral Response: CasualAlertDepressed   Type of  Therapy: Group therapy   Treatment Goals addressed: Coping   Progress Towards Goals: Progressing   Interventions: OT group   Summary: 12:00 - 12:50: Occupational Therapy group with cln E. Hollan.  12:50 - 1:00 Clinician assessed for immediate needs,  medication compliance and efficacy, and safety concerns.   Therapist Response: 12:00 - 12:50: Pt participated 12:50 - 1:00 pm: At check-out, patient reports no immediate concerns. Patient demonstrates progress as evidenced by continued engagement and responsiveness to treatment. Patient denies SI/HI/self-harm thoughts at the end of group.   Suicidal/Homicidal: Nowithout intent/plan  Plan: Pt will discharge from PHP due to meeting treatment goals of decreased depression, anxiety, and trauma symptoms, increased emotion regulation, and increased ability to manage symptoms in a healthy manner. Pt will step down to outpatient within this agency. Pt denies SI/HI at discharge.   Collaboration of Care: Medication Management AEB T Lewis, NP  Patient/Guardian was advised Release of Information must be obtained prior to any record release in order to collaborate their care with an outside provider. Patient/Guardian was advised if they have not already done so to contact the registration department to sign all necessary forms in order for us  to release information regarding their care.   Consent: Patient/Guardian gives verbal consent for treatment and assignment of benefits for services provided during this visit. Patient/Guardian expressed understanding and agreed to proceed.   Diagnosis: Severe episode of recurrent major depressive disorder, without psychotic features (HCC) [F33.2]    1. Severe episode of recurrent major depressive disorder, without psychotic features (HCC)   2. PTSD (post-traumatic stress disorder)       Randall Bastos, LCSW

## 2023-10-19 ENCOUNTER — Ambulatory Visit (HOSPITAL_COMMUNITY): Payer: Medicaid Other

## 2023-10-29 ENCOUNTER — Encounter: Payer: Self-pay | Admitting: Family Medicine

## 2023-10-29 ENCOUNTER — Ambulatory Visit (INDEPENDENT_AMBULATORY_CARE_PROVIDER_SITE_OTHER): Payer: Medicaid Other | Admitting: Family Medicine

## 2023-10-29 VITALS — BP 143/93 | HR 69 | Temp 98.5°F | Ht 68.0 in | Wt 201.6 lb

## 2023-10-29 DIAGNOSIS — F411 Generalized anxiety disorder: Secondary | ICD-10-CM | POA: Diagnosis not present

## 2023-10-29 DIAGNOSIS — F332 Major depressive disorder, recurrent severe without psychotic features: Secondary | ICD-10-CM

## 2023-10-29 DIAGNOSIS — R030 Elevated blood-pressure reading, without diagnosis of hypertension: Secondary | ICD-10-CM | POA: Diagnosis not present

## 2023-10-29 NOTE — Progress Notes (Signed)
Subjective: CC: Chronic follow-up for depression PCP: Raliegh Ip, DO Jack [name] is a 58 y.o. male presenting to clinic today for:  1.  Major depressive disorder Patient is under the care of psychiatry and psychology.  His last OV with psychiatry was in December.  Next OV will not be until March.  He continues to follow-up regularly with his therapist.  He sees them approximately every 2 weeks.  His Cymbalta 90 mg has been continued and they added prazosin 1 mg at bedtime.  He does report a couple of vivid dreams, 1 good and 1 bad but otherwise reports no significant intolerance to the prazosin.  He does feel like he is not really making as much progress from a mental health standpoint as he had hoped.  He recently was in pretty intensive therapy with group therapy but does not report that this was especially helpful.  His psychiatrist had considered adding Zoloft but there was concern for possible serotonin syndrome/drug interaction with his high dose of Cymbalta.  Therefore this was abandoned.  He reports to me that since his last office visit he has been denied disability and is currently in appeals.  He reports sadness over his father, who suffers from macular degeneration and recently lost his license.   ROS: Per HPI  Allergies  Allergen Reactions   Tramadol Nausea And Vomiting   Past Medical History:  Diagnosis Date   Anxiety    Arthritis    GERD (gastroesophageal reflux disease)    Headache    occasional    History of kidney stones     Current Outpatient Medications:    DULoxetine (CYMBALTA) 30 MG capsule, Take 3 capsules (90 mg total) by mouth daily. **new dose, Disp: 270 capsule, Rfl: 3   ibuprofen (ADVIL) 400 MG tablet, Take 1 tablet (400 mg total) by mouth every 8 (eight) hours as needed for moderate pain or mild pain., Disp: 30 tablet, Rfl: 0   prazosin (MINIPRESS) 1 MG capsule, Take 1 capsule (1 mg total) by mouth at bedtime., Disp: 30 capsule, Rfl: 2    pregabalin (LYRICA) 100 MG capsule, Take 100 mg by mouth at bedtime., Disp: , Rfl:    tiZANidine (ZANAFLEX) 2 MG tablet, Take 2 mg by mouth daily., Disp: , Rfl:  Social History   Socioeconomic History   Marital status: Single    Spouse name: Not on file   Number of children: Not on file   Years of education: Not on file   Highest education level: 12th grade  Occupational History   Not on file  Tobacco Use   Smoking status: Never   Smokeless tobacco: Former    Types: Designer, multimedia Use   Vaping status: Never Used  Substance and Sexual Activity   Alcohol use: Not Currently    Alcohol/week: 0.0 standard drinks of alcohol    Comment: occasional    Drug use: No   Sexual activity: Not on file  Other Topics Concern   Not on file  Social History Narrative   Not on file   Social Drivers of Health   Financial Resource Strain: High Risk (10/26/2023)   Overall Financial Resource Strain (CARDIA)    Difficulty of Paying Living Expenses: Hard  Food Insecurity: Patient Declined (10/26/2023)   Hunger Vital Sign    Worried About Running Out of Food in the Last Year: Patient declined    Ran Out of Food in the Last Year: Patient declined  Transportation Needs: Unmet  Transportation Needs (10/26/2023)   PRAPARE - Administrator, Civil Service (Medical): No    Lack of Transportation (Non-Medical): Yes  Physical Activity: Unknown (10/26/2023)   Exercise Vital Sign    Days of Exercise per Week: Patient declined    Minutes of Exercise per Session: Not on file  Stress: Stress Concern Present (10/26/2023)   [employer] of Occupational Health - Occupational Stress Questionnaire    Feeling of Stress : Very much  Social Connections: Unknown (10/26/2023)   Social Connection and Isolation Panel [NHANES]    Frequency of Communication with Friends and Family: Patient declined    Frequency of Social Gatherings with Friends and Family: Patient declined    Attends Religious Services: Patient  declined    Database administrator or Organizations: No    Attends Engineer, structural: Not on file    Marital Status: Divorced  Catering manager Violence: Not on file   Family History  Problem Relation Age of Onset   Arthritis Mother     Objective: Office vital signs reviewed. BP (!) 134/92   Pulse 69   Temp 98.5 F (36.9 C)   Ht 5\' 8"  (1.727 m)   Wt 201 lb 9.6 oz (91.4 kg)   SpO2 100%   BMI 30.65 kg/m   Physical Examination:  General: Awake, alert, well nourished, No acute distress HEENT: sclera white, MMM Cardio: regular rate and rhythm, S1S2 heard, no murmurs appreciated Pulm: clear to auscultation bilaterally, no wheezes, rhonchi or rales; normal work of breathing on room air Psych: Mood somewhat labile.  He is tearful intermittently when talking about his dad     10/29/2023   11:45 AM 09/17/2023    3:27 PM 09/14/2023    9:49 AM  Depression screen PHQ 2/9  Decreased Interest 2 1 1   Down, Depressed, Hopeless 2 1 2   PHQ - 2 Score 4 2 3   Altered sleeping 1 0 1  Tired, decreased energy 2 0 2  Change in appetite 1 0 0  Feeling bad or failure about yourself  2 1 2   Trouble concentrating 2 1 1   Moving slowly or fidgety/restless 2 1 1   Suicidal thoughts 1 0 0  PHQ-9 Score 15 5 10   Difficult doing work/chores Very difficult Somewhat difficult Very difficult      10/29/2023   11:48 AM 10/29/2023   11:45 AM 09/17/2023    3:24 PM 09/03/2023    1:27 PM  GAD 7 : Generalized Anxiety Score  Nervous, Anxious, on Edge 3 0 1 2  Control/stop worrying 2 0 0 2  Worry too much - different things 2 0 1 2  Trouble relaxing 2 0 1 3  Restless 2 0 1 3  Easily annoyed or irritable 2 0 1 2  Afraid - awful might happen 1 0 0 1  Total GAD 7 Score 14 0 5 15  Anxiety Difficulty Very difficult  Not difficult at all Very difficult     Assessment/ Plan: 58 y.o. male   Severe episode of recurrent major depressive disorder, without psychotic features (HCC)  GAD (generalized  anxiety disorder)  Elevated blood pressure reading in office without diagnosis of hypertension  Sadly continues to struggle with depression and anxiety.  I will CC his psychiatrist as FYI.  Sounds like he can still use some additional support and medication management.  His blood pressure was not controlled during today's visit though has been controlled in the past.  I am recommending  that he follow-up with [place] in the next couple of weeks for blood pressure recheck and if still elevated we will plan to add low-dose amlodipine.  Will need to exercise caution given use of prazosin so as to not precipitate orthostasis   Raliegh Ip, DO [facility] [phone]

## 2023-10-30 ENCOUNTER — Ambulatory Visit (HOSPITAL_COMMUNITY): Payer: Medicaid Other | Admitting: Licensed Clinical Social Worker

## 2023-11-09 ENCOUNTER — Telehealth: Payer: Self-pay

## 2023-11-09 NOTE — Telephone Encounter (Signed)
Disability Form sent in error to PA pool.  Form has been reprinted and sent to HIM for processing.

## 2023-11-09 NOTE — Telephone Encounter (Signed)
Received fax for disability determination. Attached form to patient's chart and faxed to office. Prior authorization team do not handle this.

## 2023-11-12 ENCOUNTER — Ambulatory Visit (INDEPENDENT_AMBULATORY_CARE_PROVIDER_SITE_OTHER): Payer: Medicaid Other | Admitting: *Deleted

## 2023-11-12 VITALS — BP 124/87 | HR 101

## 2023-11-12 DIAGNOSIS — R030 Elevated blood-pressure reading, without diagnosis of hypertension: Secondary | ICD-10-CM

## 2023-11-12 NOTE — Progress Notes (Signed)
Patient is in office today for a nurse visit for Blood Pressure Check. Patient blood pressure was 124/87, Patient denies any symptoms.

## 2023-11-13 ENCOUNTER — Ambulatory Visit (INDEPENDENT_AMBULATORY_CARE_PROVIDER_SITE_OTHER): Payer: Medicaid Other | Admitting: Licensed Clinical Social Worker

## 2023-11-13 DIAGNOSIS — F411 Generalized anxiety disorder: Secondary | ICD-10-CM | POA: Diagnosis not present

## 2023-11-13 DIAGNOSIS — F332 Major depressive disorder, recurrent severe without psychotic features: Secondary | ICD-10-CM | POA: Diagnosis not present

## 2023-11-13 DIAGNOSIS — F4310 Post-traumatic stress disorder, unspecified: Secondary | ICD-10-CM

## 2023-11-13 NOTE — Progress Notes (Signed)
 THERAPIST PROGRESS NOTE   Session Date: 11/13/2023  Session Time: 1400 - 1458  Participation Level: Active  Behavioral Response: CasualAlertAnxious and Euthymic  Type of Therapy: Individual Therapy  Treatment Goals addressed:  - LTG: Reduce frequency, intensity, and duration of depression symptoms so that daily functioning is improved (OP Depression) - LTG: Increase coping skills to manage depression and improve ability to perform daily activities (OP Depression) - LTG: I want to be able to talk about my past without becoming so agitated and upset (OP Depression) - STG: Report a decrease in anxiety symptoms as evidenced by an overall reduction in anxiety score by a minimum of 25% on the Generalized Anxiety Disorder Scale (GAD-7) (Anxiety) - STG: Ozell Boas will reduce frequency of avoidant behaviors by 50% as evidenced by self-report in therapy sessions (Anxiety) - LTG: To be able to function and not be so worried about everything (Anxiety)  ProgressTowards Goals: Progressing  Interventions: CBT, Motivational Interviewing, and Supportive  Summary: Boas is a 58 y.o. male with past psych history of MDD, PTSD, and GAD, presenting for follow-up therapy session in efforts to improve management of depressive and anxious symptoms. Pt actively engaged in session, presenting in pleasant moods and congruent affect, with brief instances of tearfulness when speaking to challenging stressors. Actively engaging in re-administering of PHQ-9 and GAD-7, processing scores and observed sxs reported from recent weeks. Actively detailed recent events, sharing of experiences with sxs since past session, detailing course of tx through [facility] program, varying moods throughout the past month, and recent increase in socializing with family members working on cars together. Processed thoughts and feelings surrounding recent efforts at reconnecting with family and spending time engaging in mutually enjoyable  activities, identifying joy gained from doing so. Pt expressed concerns related to losing track of thoughts while engaging with others, processing this and additional factors related to distractibility and focus further, determining to be anxiety related. Began exploration of extensive hx of anxious sxs, dating back to early childhood, with pt detailing events beginning around 4-5yo when first transitioning to a new school setting, reporting of observing anxious sxs at that time.  Patient responded well to interventions. Patient continues to meet criteria for MDD, PTSD, and GAD. Patient will continue to benefit from engagement in outpatient therapy due to being the least restrictive service to meet presenting needs.     11/13/2023    2:08 PM 10/29/2023   11:48 AM 10/29/2023   11:45 AM 09/17/2023    3:24 PM  GAD 7 : Generalized Anxiety Score  Nervous, Anxious, on Edge 2 3 0 1  Control/stop worrying 2 2 0 0  Worry too much - different things 1 2 0 1  Trouble relaxing 2 2 0 1  Restless 2 2 0 1  Easily annoyed or irritable 2 2 0 1  Afraid - awful might happen 1 1 0 0  Total GAD 7 Score 12 14 0 5  Anxiety Difficulty Very difficult Very difficult  Not difficult at all      11/13/2023    2:11 PM 10/29/2023   11:45 AM 09/17/2023    3:27 PM 09/14/2023    9:49 AM 09/03/2023    1:23 PM  Depression screen PHQ 2/9  Decreased Interest 1 2 1 1 1   Down, Depressed, Hopeless 2 2 1 2 2   PHQ - 2 Score 3 4 2 3 3   Altered sleeping 2 1 0 1 1  Tired, decreased energy 1 2 0 2 1  Change  in appetite 1 1 0 0 0  Feeling bad or failure about yourself  2 2 1 2 2   Trouble concentrating 2 2 1 1 2   Moving slowly or fidgety/restless 0 2 1 1 1   Suicidal thoughts 0 1 0 0 0  PHQ-9 Score 11 15 5 10 10   Difficult doing work/chores Very difficult Very difficult Somewhat difficult Very difficult Very difficult   Suicidal/Homicidal: No  Therapist Response: Clinician utilized CBT, MI and supportive reflection interventions to  address presenting stressors and MH sxs. Actively engaged pt in check-in, assessing presenting moods and affect. Engaged pt in re-administering of PHQ-9 and GAD-7, further reviewing scores, trends, and processing reported sxs. Actively listened to pt's recounts of past two months since previous session, evoking thoughts, feelings, and perspectives in relation to experience with PHP, moods experienced since program completion, and efforts at managing depressive and anxious moods.  Clinician reassessed severity of depressive and anxious sxs, and presence of any safety concerns. Therapist provided support and empathy to patient during session.  Plan: Return again in 2 weeks.  Diagnosis:  Encounter Diagnoses  Name Primary?   Severe episode of recurrent major depressive disorder, without psychotic features (HCC) Yes   PTSD (post-traumatic stress disorder)    GAD (generalized anxiety disorder)      Collaboration of Care: Other none necessary at this time.  Patient/Guardian was advised Release of Information must be obtained prior to any record release in order to collaborate their care with an outside provider. Patient/Guardian was advised if they have not already done so to contact the registration department to sign all necessary forms in order for us  to release information regarding their care.   Consent: Patient/Guardian gives verbal consent for treatment and assignment of benefits for services provided during this visit. Patient/Guardian expressed understanding and agreed to proceed.   [name], MSW, LCSW 11/13/2023,  2:24 PM

## 2023-11-27 ENCOUNTER — Ambulatory Visit (INDEPENDENT_AMBULATORY_CARE_PROVIDER_SITE_OTHER): Payer: Medicaid Other | Admitting: Licensed Clinical Social Worker

## 2023-11-27 DIAGNOSIS — F332 Major depressive disorder, recurrent severe without psychotic features: Secondary | ICD-10-CM | POA: Diagnosis not present

## 2023-11-27 DIAGNOSIS — F4310 Post-traumatic stress disorder, unspecified: Secondary | ICD-10-CM | POA: Diagnosis not present

## 2023-11-27 DIAGNOSIS — F411 Generalized anxiety disorder: Secondary | ICD-10-CM | POA: Diagnosis not present

## 2023-11-27 NOTE — Progress Notes (Signed)
THERAPIST PROGRESS NOTE   Session Date: 11/27/2023  Session Time: 1402 - 1455  Participation Level: Active  Behavioral Response: CasualAlertAnxious and Euthymic  Type of Therapy: Individual Therapy  Treatment Goals addressed:  - LTG: Reduce frequency, intensity, and duration of depression symptoms so that daily functioning is improved (OP Depression) - LTG: Increase coping skills to manage depression and improve ability to perform daily activities (OP Depression) - LTG: "I want to be able to talk about my past without becoming so agitated and upset" (OP Depression) - STG: Report a decrease in anxiety symptoms as evidenced by an overall reduction in anxiety score by a minimum of 25% on the Generalized Anxiety Disorder Scale (GAD-7) (Anxiety) - STG: Rudi Rummage" will reduce frequency of avoidant behaviors by 50% as evidenced by self-report in therapy sessions (Anxiety) - LTG: "To be able to function and not be so worried about everything" (Anxiety)  ProgressTowards Goals: Progressing  Interventions: CBT, Motivational Interviewing, and Supportive  Summary: "Jack [name]" is a 58 y.o. male with past psych history of MDD, PTSD, and GAD, presenting for follow-up therapy session in efforts to improve management of depressive and anxious symptoms. Pt actively engaged in session, presenting in pleasant moods and congruent affect, with periods of tearfulness.   - Pt actively engaged in re-administering of screening depressive and anxious sxs via PHQ-9 and GAD-7, engaging further in processing current scores, prior screening scores, and noted downward trends. Pt further engaged in expressing of not feeling any different, and still finding self worrying constantly and feeling down about himself, processing factors that can impact variances in observed sxs, to include limited exposure, isolation, recent physical illness, and other factors.  - Began processing pt's understanding of anxiety, exploring 'Magic  Wand' technique, identifying pt's desires to not worry so much and be able to work through anxious thoughts. Processed understanding of anxiety in small amounts proving healthy and beneficial to keeping safe, versus increased levels proving unmanageable and crippling, leading to worsening irrational thoughts. - Began exploring past trauma experienced between ages of 58-6yo, identifying individual, and sharing abilities to recall specific location of events, and the continued thoughts and feelings experienced in relation to trauma, further sharing of feeling of having carried a heavy weight for the past 50 years, and challenges sharing specifics with close family members whom he wishes to entrust with details of events. - Explored how beginning journaling will potentially support pt in documenting thoughts, tracking moods, and calming mind when completed prior to going to sleep, to support in relaxed sleep, in aims of achieving uninterrupted, healthy sleep, to improve morning moods.  Patient responded well to interventions. Patient continues to meet criteria for MDD, PTSD, and GAD. Patient will continue to benefit from engagement in outpatient therapy due to being the least restrictive service to meet presenting needs.     11/27/2023    2:05 PM 11/13/2023    2:08 PM 10/29/2023   11:48 AM 10/29/2023   11:45 AM  GAD 7 : Generalized Anxiety Score  Nervous, Anxious, on Edge 2 2 3  0  Control/stop worrying 1 2 2  0  Worry too much - different things 1 1 2  0  Trouble relaxing 1 2 2  0  Restless 1 2 2  0  Easily annoyed or irritable 1 2 2  0  Afraid - awful might happen 0 1 1 0  Total GAD 7 Score 7 12 14  0  Anxiety Difficulty Somewhat difficult Very difficult Very difficult       11/27/2023  2:08 PM 11/13/2023    2:11 PM 10/29/2023   11:45 AM 09/17/2023    3:27 PM 09/14/2023    9:49 AM  Depression screen PHQ 2/9  Decreased Interest 1 1 2 1 1   Down, Depressed, Hopeless 2 2 2 1 2   PHQ - 2 Score 3 3 4 2 3    Altered sleeping 1 2 1  0 1  Tired, decreased energy 1 1 2  0 2  Change in appetite 0 1 1 0 0  Feeling bad or failure about yourself  1 2 2 1 2   Trouble concentrating 1 2 2 1 1   Moving slowly or fidgety/restless 0 0 2 1 1   Suicidal thoughts 0 0 1 0 0  PHQ-9 Score 7 11 15 5 10   Difficult doing work/chores Somewhat difficult Very difficult Very difficult Somewhat difficult Very difficult   Suicidal/Homicidal: No  Therapist Response: Clinician utilized CBT, MI and supportive reflection interventions to address presenting stressors and MH sxs. Actively engaged pt in check-in, assessing presenting moods and affect.  - Actively engaged pt in re-assessing depressive and anxious sxs via PHQ-9 and GAD-7, further engaging pt in processing of current scores, previous scores, and overall downward trends.  - Evoked pt's thoughts and perspectives surrounding variances in scores, processing various contributing factors to variances in scores.  -  Actively listened to pt's recounts of past two weeks since previous visit, evoking thoughts, feelings, and perspectives in relation to efforts at managing presentation of sxs. - Actively listened to pt's recounts of traumatic events occurring between 5-8yo, supporting pt in identifying various feelings and thoughts experienced surrounding trauma and aiding pt in understanding impact of trauma of functioning.  Clinician reassessed severity of depressive and anxious sxs, and presence of any safety concerns. Therapist provided support and empathy to patient during session.  Plan: Return again in 2 weeks.  Diagnosis:  Encounter Diagnoses  Name Primary?   Severe episode of recurrent major depressive disorder, without psychotic features (HCC) Yes   PTSD (post-traumatic stress disorder)    GAD (generalized anxiety disorder)     Collaboration of Care: Psychiatrist AEB provider notes available in EHR.  Patient/Guardian was advised Release of Information must be  obtained prior to any record release in order to collaborate their care with an outside provider. Patient/Guardian was advised if they have not already done so to contact the registration department to sign all necessary forms in order for [place] to release information regarding their care.   Consent: Patient/Guardian gives verbal consent for treatment and assignment of benefits for services provided during this visit. Patient/Guardian expressed understanding and agreed to proceed.   Leisa Lenz, MSW, LCSW 11/27/2023,  2:13 PM

## 2023-12-10 ENCOUNTER — Telehealth (HOSPITAL_BASED_OUTPATIENT_CLINIC_OR_DEPARTMENT_OTHER): Payer: MEDICAID | Admitting: Family

## 2023-12-10 DIAGNOSIS — F4310 Post-traumatic stress disorder, unspecified: Secondary | ICD-10-CM

## 2023-12-10 DIAGNOSIS — F332 Major depressive disorder, recurrent severe without psychotic features: Secondary | ICD-10-CM | POA: Diagnosis not present

## 2023-12-10 MED ORDER — PRAZOSIN HCL 1 MG PO CAPS: 1.0000 mg | ORAL_CAPSULE | Freq: Every day | ORAL | 2 refills | Status: DC

## 2023-12-10 MED ORDER — PRAZOSIN HCL 1 MG PO CAPS: 2.0000 mg | ORAL_CAPSULE | Freq: Every day | ORAL | 0 refills | Status: DC

## 2023-12-10 NOTE — Addendum Note (Signed)
 Addended by: Oneta Rack on: 12/10/2023 09:56 AM   Modules accepted: Level of Service

## 2023-12-10 NOTE — Progress Notes (Signed)
 Virtual Visit via Video Note  I connected with Jack [name] on 12/10/23 at  8:30 AM EST by a video enabled telemedicine application and verified that I am speaking with the correct person using two identifiers.  Location: Patient: Office Provider: Home   I discussed the limitations of evaluation and management by telemedicine and the availability of in person appointments. The patient expressed understanding and agreed to proceed.   I discussed the assessment and treatment plan with the patient. The patient was provided an opportunity to ask questions and all were answered. The patient agreed with the plan and demonstrated an understanding of the instructions.   The patient was advised to call back or seek an in-person evaluation if the symptoms worsen or if the condition fails to improve as anticipated.  I provided 15 minutes of non-face-to-face time during this encounter.   Oneta Rack, NP   [facility] MD/PA/NP OP Progress Note  12/10/2023 8:34 AM Jack [name]  MRN:  [id number]  Chief Complaint: Medication management   HPI: Jack [name] was seen and evaluated via care agility virtual platform.  Presents with a present and bright affect.  States " I feel about the same."  Stated his mood has been stable.  He is currently prescribed Cymbalta 90 mg, Lyrica 100 mg and Minipress 1 mg nightly.  Reports he has been taking and tolerating medications well.  Denying any medication side effects.  States he has been following up with therapy services.  States often times all sessions are difficult to unpack.  However, continues to live day by day.   Denied concerns related to suicidal or homicidal ideations.  Denies auditory or visual hallucinations.  Jack [name] continues to endorse vivid nightmares.  States often times his nightmares will awaken him from his sleep and he is unable to go back to sleep.  Discussed titrating Minipress 1 mg to 2 mg.  Education provided with monitoring blood pressure  signs and symptoms related to dizziness and potential fall risk.  He was receptive to plan.  Does report concerns related to insurance as he states that his [place] healthcare was recently changed to [place].  Reports he has plans to follow-up with billing department for additional clarification.  Support, encouragement reassurance was provided.  Major depressive disorder Generalized anxiety disorder Increase Minipress 1 mg to 2 mg nightly Patient's Cymbalta 90 mg and Lyrica 100 mg is currently prescribed by primary care provider due to reported knee and back pain.  Visit Diagnosis:    ICD-10-CM   1. Severe episode of recurrent major depressive disorder, without psychotic features (HCC)  F33.2     2. PTSD (post-traumatic stress disorder)  F43.10       Past Psychiatric History:   Past Medical History:  Past Medical History:  Diagnosis Date   Anxiety    Arthritis    GERD (gastroesophageal reflux disease)    Headache    occasional    History of kidney stones     Past Surgical History:  Procedure Laterality Date   BIOPSY  04/23/2023   Procedure: BIOPSY;  Surgeon: Lanelle Bal, DO;  [facility]: [facility];  Service: Endoscopy;;   COLONOSCOPY WITH PROPOFOL N/A 04/23/2023   Procedure: COLONOSCOPY WITH PROPOFOL;  Surgeon: Lanelle Bal, DO;  Location: [facility];  Service: Endoscopy;  Laterality: N/A;  9:00 AM, ASA 3   EXTRACORPOREAL SHOCK WAVE LITHOTRIPSY Right 05/30/2018   Procedure: RIGHT EXTRACORPOREAL SHOCK WAVE LITHOTRIPSY (ESWL);  Surgeon: Marlou Porch,  Earle Gell, MD;  Location: [facility];  Service: Urology;  Laterality: Right;   KNEE ARTHROSCOPY Right    KNEE SURGERY Left    3 arthroscopic,  two ACL reconstuctions   SHOULDER SURGERY Right    dislocation repair with a pin   TONSILLECTOMY AND ADENOIDECTOMY     TOTAL KNEE ARTHROPLASTY Left 07/12/2015   Procedure: TOTAL LEFT  KNEE ARTHROPLASTY;  Surgeon: Ollen Gross, MD;  Location: [facility];  Service: Orthopedics;  Laterality:  Left;   tubes in ears      Family Psychiatric History:   Family History:  Family History  Problem Relation Age of Onset   Arthritis Mother     Social History:  Social History   Socioeconomic History   Marital status: Single    Spouse name: Not on file   Number of children: Not on file   Years of education: Not on file   Highest education level: 12th grade  Occupational History   Not on file  Tobacco Use   Smoking status: Never   Smokeless tobacco: Former    Types: Engineer, drilling   Vaping status: Never Used  Substance and Sexual Activity   Alcohol use: Not Currently    Alcohol/week: 0.0 standard drinks of alcohol    Comment: occasional    Drug use: No   Sexual activity: Not on file  Other Topics Concern   Not on file  Social History Narrative   Not on file   Social Drivers of Health   Financial Resource Strain: High Risk (10/26/2023)   Overall Financial Resource Strain (CARDIA)    Difficulty of Paying Living Expenses: Hard  Food Insecurity: Patient Declined (10/26/2023)   Hunger Vital Sign    Worried About Running Out of Food in the Last Year: Patient declined    Ran Out of Food in the Last Year: Patient declined  Transportation Needs: Unmet Transportation Needs (10/26/2023)   PRAPARE - Administrator, Civil Service (Medical): No    Lack of Transportation (Non-Medical): Yes  Physical Activity: Unknown (10/26/2023)   Exercise Vital Sign    Days of Exercise per Week: Patient declined    Minutes of Exercise per Session: Not on file  Stress: Stress Concern Present (10/26/2023)   [employer] of Occupational Health - Occupational Stress Questionnaire    Feeling of Stress : Very much  Social Connections: Unknown (10/26/2023)   Social Connection and Isolation Panel [NHANES]    Frequency of Communication with Friends and Family: Patient declined    Frequency of Social Gatherings with Friends and Family: Patient declined    Attends Religious Services:  Patient declined    Database administrator or Organizations: No    Attends Engineer, structural: Not on file    Marital Status: Divorced    Allergies:  Allergies  Allergen Reactions   Tramadol Nausea And Vomiting    Metabolic Disorder Labs: Lab Results  Component Value Date   HGBA1C 5.4 04/18/2023   No results found for: "PROLACTIN" Lab Results  Component Value Date   CHOL 157 04/18/2023   TRIG 133 04/18/2023   HDL 42 04/18/2023   CHOLHDL 3.7 04/18/2023   VLDL 25 05/10/2012   LDLCALC 91 04/18/2023   LDLCALC 70 07/02/2019   Lab Results  Component Value Date   TSH 0.464 07/02/2019   TSH 1.260 02/09/2017    Therapeutic Level Labs: No results found for: "LITHIUM" No results found for: "VALPROATE" No results found for: "  CBMZ"  Current Medications: Current Outpatient Medications  Medication Sig Dispense Refill   DULoxetine (CYMBALTA) 30 MG capsule Take 3 capsules (90 mg total) by mouth daily. **new dose 270 capsule 3   ibuprofen (ADVIL) 400 MG tablet Take 1 tablet (400 mg total) by mouth every 8 (eight) hours as needed for moderate pain or mild pain. 30 tablet 0   prazosin (MINIPRESS) 1 MG capsule Take 1 capsule (1 mg total) by mouth at bedtime. 30 capsule 2   pregabalin (LYRICA) 100 MG capsule Take 100 mg by mouth at bedtime.     tiZANidine (ZANAFLEX) 2 MG tablet Take 2 mg by mouth daily.     No current facility-administered medications for this visit.     Musculoskeletal: Virtual platform  Psychiatric Specialty Exam: Review of Systems  Constitutional: Negative.   HENT: Negative.    Psychiatric/Behavioral:  Positive for sleep disturbance. Negative for behavioral problems. The patient is nervous/anxious.   All other systems reviewed and are negative.   There were no vitals taken for this visit.There is no height or weight on file to calculate BMI.  General Appearance: Casual  Eye Contact:  Good  Speech:  Clear and Coherent  Volume:  Normal  Mood:   Anxious and Depressed  Affect:  Appropriate  Thought Process:  Coherent  Orientation:  Full (Time, Place, and Person)  Thought Content: Logical   Suicidal Thoughts:  No  Homicidal Thoughts:  No  Memory:  Immediate;   Good Recent;   Good  Judgement:  Good  Insight:  Good  Psychomotor Activity:  Normal  Concentration:  Concentration: Good  Recall:  Good  Fund of Knowledge: Good  Language: Good  Akathisia:  No  Handed:  Right  AIMS (if indicated): not done  Assets:  Communication Skills Desire for Improvement Resilience Social Support  ADL's:  Intact  Cognition: WNL  Sleep:  Fair   Screenings: AUDIT    Garment/textile technologist Visit from 10/29/2023 in [facility]  Alcohol Use Disorder Identification Test Final Score (AUDIT) 3       GAD-7    Flowsheet Row Counselor from 11/27/2023 in [facility] [place] Counselor from 11/13/2023 in [facility] Visit from 10/29/2023 in [facility] Counselor from 09/17/2023 in [facility] [facility] [place] Counselor from 09/03/2023 in [facility]  Total GAD-7 Score 7 12 14 5 15       PHQ2-9    Flowsheet Row Counselor from 11/27/2023 in [facility] from 11/13/2023 in [facility] Visit from 10/29/2023 in [facility] Counselor from 09/17/2023 in [facility] [place] Counselor from 09/14/2023 in [place]  PHQ-2 Total Score 3 3 4 2 3   PHQ-9 Total Score 7 11 15 5 10       Flowsheet Row Counselor from 09/14/2023 in [facility] Counselor from 09/03/2023 in [facility] from 08/07/2023 in [facility]  C-SSRS RISK CATEGORY No Risk No Risk No Risk        Assessment and Plan: Jack [name] presents for medication management follow-up appointment.  He is currently prescribed minimal press 1 mg by this provider.  Per his primary care  he is to continue Cymbalta and Lyrica for reported knee and back pain.  He carries a diagnosis related to major depressive disorder generalized anxiety disorder and posttraumatic stress disorder.  He continues to endorse vivid dreams/nightmares.  Discussed titrating Minipress 1 mg to 2 mg nightly.  Reports overall his mood has stabilized.  States he does feel effects of medications however he is not sure which medication is helping with his symptoms as he takes all of his medications nightly.  Reports therapy services is going well.  Patient to continue to follow-up as needed.  Will schedule follow-up appointment 4 to 5 months.  He was receptive to plan.    Collaboration of Care: Collaboration of Care: Medication Management AEB increase Minipress 1 mg to 2 mg nightly -Education provided with signs and symptoms related to hypotension i.e. dizziness, potential fall risk who was receptive to plan   Patient/Guardian was advised Release of Information must be obtained prior to any record release in order to collaborate their care with an outside provider. Patient/Guardian was advised if they have not already done so to contact the registration department to sign all necessary forms in order for [place] to release information regarding their care.   Consent: Patient/Guardian gives verbal consent for treatment and assignment of benefits for services provided during this visit. Patient/Guardian expressed understanding and agreed to proceed.    Oneta Rack, NP 12/10/2023, 8:34 AM

## 2023-12-11 ENCOUNTER — Ambulatory Visit (INDEPENDENT_AMBULATORY_CARE_PROVIDER_SITE_OTHER): Payer: MEDICAID | Admitting: Licensed Clinical Social Worker

## 2023-12-11 DIAGNOSIS — F332 Major depressive disorder, recurrent severe without psychotic features: Secondary | ICD-10-CM | POA: Diagnosis not present

## 2023-12-11 DIAGNOSIS — F411 Generalized anxiety disorder: Secondary | ICD-10-CM | POA: Diagnosis not present

## 2023-12-11 NOTE — Progress Notes (Unsigned)
 THERAPIST PROGRESS NOTE   Session Date: 12/11/2023  Session Time: 1408 - 1503  Participation Level: Active  Behavioral Response: CasualAlertAnxious, Euthymic, and tearfulness  Type of Therapy: Individual Therapy  Treatment Goals addressed:  - LTG: Reduce frequency, intensity, and duration of depression symptoms so that daily functioning is improved (OP Depression) - LTG: Increase coping skills to manage depression and improve ability to perform daily activities (OP Depression) - LTG: "I want to be able to talk about my past without becoming so agitated and upset" (OP Depression) - STG: Report a decrease in anxiety symptoms as evidenced by an overall reduction in anxiety score by a minimum of 25% on the Generalized Anxiety Disorder Scale (GAD-7) (Anxiety) - STG: Rudi Rummage" will reduce frequency of avoidant behaviors by 50% as evidenced by self-report in therapy sessions (Anxiety) - LTG: "To be able to function and not be so worried about everything" (Anxiety)  ProgressTowards Goals: Progressing  Interventions: CBT, Motivational Interviewing, and Supportive  Summary: "Jack [name]" is a 58 y.o. male with past psych history of MDD, PTSD, and GAD, presenting for follow-up therapy in efforts to improve management of presenting symptoms. Pt actively engaged in session, presenting in overall pleasant moods and congruent affect, with brief episodes of tearfulness.   - Pt actively engaged in session check-in, inquiring of concerns expressed surrounding the stigma associted with mental health challenges and how providers manage to navigate pt's presenting stressors, engaging in processing thoughts and understanding surrounding likelihood of many individuals experiencing challenges with navigating stressors impacting mental health at varying points across ones lifespan. - Actively engaged in reassessing of depressive and anxious sxs via PHQ-9 and GAD-7, further exploring variances in presenting sxs over  recent weeks in comparison to prior weeks, and pt's challenges in managing stressors. Pt reports increased anx and depressive sxs being related to recent interaction with aunt, having spent recent days ruminating on events and exchanged words, sharing of the words said by aunt to be hurtful to pt and belittling. Pt further detailed of having engaged in increased conflict with elderly aunt, resulting in connecting with cousin and ultimately learning of there being miscommunication and misunderstanding surrounding pt's originally expressed needs. Pt further processed how he believes this could have been navigated differently to have prevented such conflict and stress. - Engaged in understanding of Cognitive triangle, processing understanding of relationship between thoughts, feelings, and behaviors, and the impact of thought processes on moods, further reviewing challenging negative thoughts technique employing skill in processing recent interaction with aunt, processing negative thoughts surrounding events.  Patient responded well to interventions. Patient continues to meet criteria for MDD, PTSD, and GAD. Patient will continue to benefit from engagement in outpatient therapy due to being the least restrictive service to meet presenting needs.     12/11/2023    2:17 PM 11/27/2023    2:05 PM 11/13/2023    2:08 PM 10/29/2023   11:48 AM  GAD 7 : Generalized Anxiety Score  Nervous, Anxious, on Edge 2 2 2 3   Control/stop worrying 2 1 2 2   Worry too much - different things 1 1 1 2   Trouble relaxing 2 1 2 2   Restless 2 1 2 2   Easily annoyed or irritable 1 1 2 2   Afraid - awful might happen 1 0 1 1  Total GAD 7 Score 11 7 12 14   Anxiety Difficulty Extremely difficult Somewhat difficult Very difficult Very difficult      12/11/2023    2:28 PM 11/27/2023  2:08 PM 11/13/2023    2:11 PM 10/29/2023   11:45 AM 09/17/2023    3:27 PM  Depression screen PHQ 2/9  Decreased Interest 2 1 1 2 1   Down, Depressed,  Hopeless 2 2 2 2 1   PHQ - 2 Score 4 3 3 4 2   Altered sleeping 1 1 2 1  0  Tired, decreased energy 2 1 1 2  0  Change in appetite 1 0 1 1 0  Feeling bad or failure about yourself  3 1 2 2 1   Trouble concentrating 2 1 2 2 1   Moving slowly or fidgety/restless 1 0 0 2 1  Suicidal thoughts 0 0 0 1 0  PHQ-9 Score 14 7 11 15 5   Difficult doing work/chores Very difficult Somewhat difficult Very difficult Very difficult Somewhat difficult   Suicidal/Homicidal: No  Therapist Response: Clinician utilized CBT, MI and supportive reflection interventions to address presenting stressors and MH sxs. Actively engaged pt in check-in, assessing presenting moods and affect.  - Actively supported pt in navigation of initial questions surrounding providers support for pt's navigating presenting MH sxs. - Reassessed presenting depressive and anxious sxs via PHQ-9 and GAD-7, engaging pt in further discussion surrounding increase in reported sxs over past two weeks in comparison to prior weeks, supporting pt in reflection of recent events, validating expressed thoughts and feelings.  - Introduced cognitive triangle concept to pt, engaging in processing of thoughts and understanding of relationship between thoughts, feelings, and behaviors. - Briefly introduced 'Challenging Negative Thoughts' technique to support pt in beginning to navigate negative, irrational thoughts, and adjust perspectives.  Clinician reassessed severity of depressive and anxious sxs, and presence of any safety concerns. Therapist provided support and empathy to patient during session.  Plan: Return again in 2 weeks.  Review efforts towards incorporating journaling thoughts and feelings into routine, and how has proven to support in managing stress.  Diagnosis:  Encounter Diagnoses  Name Primary?   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  Patient/Guardian was advised Release of Information must be obtained prior to any record release in order to collaborate their care with an outside provider. Patient/Guardian was advised if they have not already done so to contact the registration department to sign all necessary forms in order for [place] to release information regarding their care.   Consent: Patient/Guardian gives verbal consent for treatment and assignment of benefits for services provided during this visit. Patient/Guardian expressed understanding and agreed to proceed.   Leisa Lenz, MSW, LCSW 12/11/2023,  2:30 PM

## 2023-12-25 ENCOUNTER — Ambulatory Visit (INDEPENDENT_AMBULATORY_CARE_PROVIDER_SITE_OTHER): Payer: MEDICAID | Admitting: Licensed Clinical Social Worker

## 2023-12-25 DIAGNOSIS — F411 Generalized anxiety disorder: Secondary | ICD-10-CM | POA: Diagnosis not present

## 2023-12-25 DIAGNOSIS — F332 Major depressive disorder, recurrent severe without psychotic features: Secondary | ICD-10-CM | POA: Diagnosis not present

## 2023-12-25 DIAGNOSIS — F4310 Post-traumatic stress disorder, unspecified: Secondary | ICD-10-CM

## 2023-12-25 MED ORDER — PRAZOSIN HCL 1 MG PO CAPS: 2.0000 mg | ORAL_CAPSULE | Freq: Every day | ORAL | 0 refills | Status: AC
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
## 2023-12-25 NOTE — Addendum Note (Signed)
 Addended by: Oneta Rack on: 12/25/2023 02:22 PM   Modules accepted: Orders

## 2024-01-10 ENCOUNTER — Ambulatory Visit (HOSPITAL_COMMUNITY): Payer: MEDICAID | Admitting: Licensed Clinical Social Worker

## 2024-01-10 DIAGNOSIS — F332 Major depressive disorder, recurrent severe without psychotic features: Secondary | ICD-10-CM

## 2024-01-10 DIAGNOSIS — F4310 Post-traumatic stress disorder, unspecified: Secondary | ICD-10-CM | POA: Diagnosis not present

## 2024-01-10 DIAGNOSIS — F411 Generalized anxiety disorder: Secondary | ICD-10-CM
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
## 2024-01-21 ENCOUNTER — Other Ambulatory Visit (HOSPITAL_COMMUNITY): Payer: Self-pay | Admitting: Family

## 2024-01-24 ENCOUNTER — Encounter (HOSPITAL_COMMUNITY): Payer: Self-pay

## 2024-01-24 ENCOUNTER — Ambulatory Visit (HOSPITAL_COMMUNITY): Payer: MEDICAID | Admitting: Licensed Clinical Social Worker

## 2024-01-28 ENCOUNTER — Ambulatory Visit: Payer: MEDICAID | Admitting: Family Medicine

## 2024-01-28 ENCOUNTER — Encounter: Payer: Self-pay | Admitting: Family Medicine

## 2024-01-28 VITALS — BP 140/85 | HR 78 | Temp 98.5°F | Ht 68.0 in | Wt 204.2 lb

## 2024-01-28 DIAGNOSIS — F419 Anxiety disorder, unspecified: Secondary | ICD-10-CM

## 2024-01-28 DIAGNOSIS — F4310 Post-traumatic stress disorder, unspecified: Secondary | ICD-10-CM

## 2024-01-28 DIAGNOSIS — F332 Major depressive disorder, recurrent severe without psychotic features: Secondary | ICD-10-CM

## 2024-01-28 DIAGNOSIS — M5441 Lumbago with sciatica, right side: Secondary | ICD-10-CM

## 2024-01-28 DIAGNOSIS — G8929 Other chronic pain: Secondary | ICD-10-CM

## 2024-01-28 MED ORDER — DULOXETINE HCL 30 MG PO CPEP: 90.0000 mg | ORAL_CAPSULE | Freq: Every day | ORAL | 3 refills | Status: AC

## 2024-01-28 NOTE — Progress Notes (Signed)
 Subjective: CC: Chronic follow-up PCP: Eliodoro Guerin, DO [id number] Jack [name] is a 58 y.o. male presenting to clinic today for:  1.  Anxiety, depression, PTSD Patient continues to see Dr. Drury Geralds for therapy every 2 weeks and Dr. Harles Lied for medication management every 2 to 3 months.  He reports that he has is up and down days but overall things seem to be relatively stable.  He is on 2 mg of Minipress  and 90 mg of Cymbalta  and these were the recommendations to continue.  He reports that he is in appeals for disability again and this does weigh on his spirit.  He continues to worry about his father, who suffers from blindness and lost his license.  He also reports that he injured his right knee about a month ago when he slipped and hyperflexed the right knee.  Initially, it really was not bothersome but roughly a week later he started having quite a bit of pain, particularly laterally.  He is to undergo an MRI of the knee on Saturday with the [facility] to further explore what is going on.  He does have history of medial meniscal injury in the past on this side that required repair x 2.  He will be seeing Dr. Aluiscio on Thursday to discuss MRI results   ROS: Per HPI  Allergies  Allergen Reactions   Tramadol  Nausea And Vomiting   Past Medical History:  Diagnosis Date   Anxiety    Arthritis    GERD (gastroesophageal reflux disease)    Headache    occasional    History of kidney stones     Current Outpatient Medications:    DULoxetine  (CYMBALTA ) 30 MG capsule, Take 3 capsules (90 mg total) by mouth daily. **new dose, Disp: 270 capsule, Rfl: 3   ibuprofen  (ADVIL ) 400 MG tablet, Take 1 tablet (400 mg total) by mouth every 8 (eight) hours as needed for moderate pain or mild pain., Disp: 30 tablet, Rfl: 0   prazosin  (MINIPRESS ) 1 MG capsule, Take 2 capsules (2 mg total) by mouth at bedtime., Disp: 60 capsule, Rfl: 0   pregabalin (LYRICA) 100 MG capsule, Take 100 mg by mouth at bedtime.,  Disp: , Rfl:    tiZANidine (ZANAFLEX) 2 MG tablet, Take 2 mg by mouth daily., Disp: , Rfl:  Social History   Socioeconomic History   Marital status: Single    Spouse name: Not on file   Number of children: Not on file   Years of education: Not on file   Highest education level: 12th grade  Occupational History   Not on file  Tobacco Use   Smoking status: Never   Smokeless tobacco: Former    Types: Designer, multimedia Use   Vaping status: Never Used  Substance and Sexual Activity   Alcohol use: Not Currently    Alcohol/week: 0.0 standard drinks of alcohol    Comment: occasional    Drug use: No   Sexual activity: Not on file  Other Topics Concern   Not on file  Social History Narrative   Not on file   Social Drivers of Health   Financial Resource Strain: High Risk (10/26/2023)   Overall Financial Resource Strain (CARDIA)    Difficulty of Paying Living Expenses: Hard  Food Insecurity: Patient Declined (10/26/2023)   Hunger Vital Sign    Worried About Running Out of Food in the Last Year: Patient declined    Ran Out of Food in the Last Year: Patient declined  Transportation Needs: Unmet Transportation Needs (10/26/2023)   PRAPARE - Transportation    Lack of Transportation (Medical): No    Lack of Transportation (Non-Medical): Yes  Physical Activity: Unknown (10/26/2023)   Exercise Vital Sign    Days of Exercise per Week: Patient declined    Minutes of Exercise per Session: Not on file  Stress: Stress Concern Present (10/26/2023)   [employer] of Occupational Health - Occupational Stress Questionnaire    Feeling of Stress : Very much  Social Connections: Unknown (10/26/2023)   Social Connection and Isolation Panel [NHANES]    Frequency of Communication with Friends and Family: Patient declined    Frequency of Social Gatherings with Friends and Family: Patient declined    Attends Religious Services: Patient declined    Database administrator or Organizations: No    Attends  Engineer, structural: Not on file    Marital Status: Divorced  Catering manager Violence: Not on file   Family History  Problem Relation Age of Onset   Arthritis Mother     Objective: Office vital signs reviewed. BP (!) 140/85   Pulse 78   Temp 98.5 F (36.9 C)   Ht 5\' 8"  (1.727 m)   Wt 204 lb 3.2 oz (92.6 kg)   SpO2 98%   BMI 31.05 kg/m   Physical Examination:  General: Awake, alert, nontoxic male, No acute distress MSK: Ambulating independently with antalgic gait.  He has a brace on the right knee.  There is no gross soft tissue swelling, erythema or deformity appreciated Psych: Mood initially is very happy but once we start discussing his father he becomes tearful.     01/28/2024    9:28 AM 12/25/2023    2:33 PM 12/11/2023    2:28 PM  Depression screen PHQ 2/9  Decreased Interest 1    Down, Depressed, Hopeless 2    PHQ - 2 Score 3    Altered sleeping 2    Tired, decreased energy 1    Change in appetite 1    Feeling bad or failure about yourself  2    Trouble concentrating 1    Moving slowly or fidgety/restless 2    Suicidal thoughts 0    PHQ-9 Score 12    Difficult doing work/chores Very difficult       Information is confidential and restricted. Go to Review Flowsheets to unlock data.      01/28/2024    9:28 AM 12/25/2023    2:29 PM 12/11/2023    2:17 PM 11/27/2023    2:05 PM  GAD 7 : Generalized Anxiety Score  Nervous, Anxious, on Edge 2     Control/stop worrying 2     Worry too much - different things 2     Trouble relaxing 1     Restless 2     Easily annoyed or irritable 2     Afraid - awful might happen 1     Total GAD 7 Score 12     Anxiety Difficulty Somewhat difficult        Information is confidential and restricted. Go to Review Flowsheets to unlock data.      Assessment/ Plan: 58 y.o. male   Severe episode of recurrent major depressive disorder, without psychotic features (HCC)  Anxiety - Plan: DULoxetine  (CYMBALTA ) 30 MG  capsule  PTSD (post-traumatic stress disorder)  Chronic right-sided low back pain with right-sided sciatica - Plan: DULoxetine  (CYMBALTA ) 30 MG capsule  I renewed his medications.  He is to continue following up with psychiatry and therapy as directed.  We will plan to follow-up in 3 to 4 months for annual physical with fasting labs, sooner if concerns arise   Shaila Gilchrest Bambi Bonine, DO [facility] [phone]

## 2024-02-07 ENCOUNTER — Ambulatory Visit (INDEPENDENT_AMBULATORY_CARE_PROVIDER_SITE_OTHER): Payer: MEDICAID | Admitting: Licensed Clinical Social Worker

## 2024-02-07 DIAGNOSIS — F411 Generalized anxiety disorder: Secondary | ICD-10-CM

## 2024-02-07 DIAGNOSIS — F332 Major depressive disorder, recurrent severe without psychotic features: Secondary | ICD-10-CM

## 2024-02-07 NOTE — Progress Notes (Unsigned)
 THERAPIST PROGRESS NOTE   Session Date: 02/07/2024  Session Time: 1405 - 1459  Participation Level: Active  Behavioral Response: CasualAlertAnxious and Euthymic  Type of Therapy: Individual Therapy  Treatment Goals addressed:  - LTG: Reduce frequency, intensity, and duration of depression symptoms so that daily functioning is improved (OP Depression) - LTG: Increase coping skills to manage depression and improve ability to perform daily activities (OP Depression) - LTG: "I want to be able to talk about my past without becoming so agitated and upset" (OP Depression) - STG: Report a decrease in anxiety symptoms as evidenced by an overall reduction in anxiety score by a minimum of 25% on the Generalized Anxiety Disorder Scale (GAD-7) (Anxiety) - STG: Jack [name]" will reduce frequency of avoidant behaviors by 50% as evidenced by self-report in therapy sessions (Anxiety) - LTG: "To be able to function and not be so worried about everything" (Anxiety)  ProgressTowards Goals: Progressing  Interventions: CBT, Motivational Interviewing, and Supportive  Summary: "Jack [name]" is a 58 y.o. male with past psych history of MDD, PTSD, and GAD, presenting for follow-up therapy in efforts to improve management of presenting symptoms.  Patient actively engaged in session, presenting in pleasant moods and congruent affect throughout duration of visit, with minimal instances of anxiousness or distress.  Patient engaged in introductory check-in, sharing of things to be maintaining over the past month, and detailing of having canceled prior scheduled appointment 2 weeks ago due to a funeral.  Patient shared of having noticed mild improvements in moods with recent medication adjustments over the past months.  Patient actively engaged in detailing recent events related to knee injury, expressing of being thankful for injury to right knee to be only a bone bruise rather than a potential tear.  Further engaged in  reflection of recent PCP appointment in processing of GAD-7 and PHQ-9 screenings, noting of improvements in downward trend over recent months.  Actively engaged in review of individualized treatment goals outlined in treatment plan, exploring progress and/or lack thereof thus far throughout treatment, and areas for continued work throughout future course of treatment.  Patient responded well to interventions. Patient continues to meet criteria for MDD, PTSD, and GAD. Patient will continue to benefit from engagement in outpatient therapy due to being the least restrictive service to meet presenting needs.     01/28/2024    9:28 AM 12/25/2023    2:29 PM 12/11/2023    2:17 PM 11/27/2023    2:05 PM  GAD 7 : Generalized Anxiety Score  Nervous, Anxious, on Edge 2 3 2 2   Control/stop worrying 2 3 2 1   Worry too much - different things 2 3 1 1   Trouble relaxing 1 3 2 1   Restless 2 2 2 1   Easily annoyed or irritable 2 3 1 1   Afraid - awful might happen 1 1 1  0  Total GAD 7 Score 12 18 11 7   Anxiety Difficulty Somewhat difficult Extremely difficult Extremely difficult Somewhat difficult      01/28/2024    9:28 AM 12/25/2023    2:33 PM 12/11/2023    2:28 PM 11/27/2023    2:08 PM 11/13/2023    2:11 PM  Depression screen PHQ 2/9  Decreased Interest 1 2 2 1 1   Down, Depressed, Hopeless 2 3 2 2 2   PHQ - 2 Score 3 5 4 3 3   Altered sleeping 2 2 1 1 2   Tired, decreased energy 1 2 2 1 1   Change in appetite 1 2 1  0 1  Feeling bad or failure about yourself  2 3 3 1 2   Trouble concentrating 1 3 2 1 2   Moving slowly or fidgety/restless 2 3 1  0 0  Suicidal thoughts 0 0 0 0 0  PHQ-9 Score 12 20 14 7 11   Difficult doing work/chores Very difficult Extremely dIfficult Very difficult Somewhat difficult Very difficult   Suicidal/Homicidal: No  Therapist Response: Clinician utilized CBT, MI and supportive reflection interventions to address presenting stressors and MH sxs.  Clinician openly agreed the patient,  engaging the patient in introductory check-in, assessing presenting mood and affect and further encouraging patient's reflection of recent events and factors contributing to presenting moves.  Actively listened the patient's reflections of recent events and progressions surrounding prior identified stressor, specific to injury, utilizing supportive listening, validating patient expressed thoughts and feelings.  Actively listened to the patient's reflections of recent events, utilizing open-ended questions to further explore patient's thoughts, feelings, and perspectives surrounding events and presenting stressors.  Actively engaged patient in 6 months review of individualized treatment goals outlined in treatment plan, processing progress and/or lack thereof, and desired areas for continued work.  Clinician reassessed severity of depressive and anxious sxs, and presence of any safety concerns. Therapist provided support and empathy to patient during session.  Plan: Return again in 2 weeks.  Review efforts towards incorporating journaling thoughts and feelings into routine, and how has proven to support in managing stress.  Diagnosis:  Encounter Diagnoses  Name Primary?   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  Patient/Guardian was advised Release of Information must be obtained prior to any record release in order to collaborate their care with an outside provider. Patient/Guardian was advised if they have not already done so to contact the registration department to sign all necessary forms in order for us  to release information regarding their care.   Consent: Patient/Guardian gives verbal consent for treatment and assignment of benefits for services provided during this visit. Patient/Guardian expressed understanding and agreed  to proceed.   Patsi Boots, MSW, LCSW 02/07/2024,  2:59 PM

## 2024-02-25 ENCOUNTER — Ambulatory Visit (INDEPENDENT_AMBULATORY_CARE_PROVIDER_SITE_OTHER): Payer: MEDICAID | Admitting: Licensed Clinical Social Worker

## 2024-02-25 ENCOUNTER — Encounter (HOSPITAL_COMMUNITY): Payer: Self-pay

## 2024-02-25 NOTE — Progress Notes (Signed)
 THERAPIST PROGRESS NOTE   Session Date: 02/25/2024  Session Time: 1400  Pt contacted office at time of visit, notifying office personnel of inabilities to access caregility video visit. LCSW sent 2x connection link texts at 1403 and 1409 in aims of supporting pt in gaining access. Pt was not able to connect to call. Today's visit will be considered canceled due to technical/access issues. Pt will be advised when contacting office to connect with mychart support to navigate accessibility challenges to ensure access for future visits.   Patsi Boots, MSW, LCSW 02/25/2024,  2:24 PM

## 2024-03-03 ENCOUNTER — Encounter (HOSPITAL_COMMUNITY): Payer: Self-pay

## 2024-03-10 ENCOUNTER — Encounter (HOSPITAL_COMMUNITY): Payer: Self-pay

## 2024-03-10 ENCOUNTER — Ambulatory Visit (INDEPENDENT_AMBULATORY_CARE_PROVIDER_SITE_OTHER): Payer: MEDICAID | Admitting: Licensed Clinical Social Worker

## 2024-03-10 NOTE — Progress Notes (Signed)
 THERAPIST PROGRESS NOTE   Session Date: 03/10/2024  Session Time: 1400  Pt contacted office prior to time of visit again, detailing of repeated challenges connecting to video visit. LCSW sent 3x connection link texts at 1403, 1408, and 1410 in aims of supporting pt in gaining access. Pt was not able to connect to call. LCSW sent caregility phone call link at 1432, to which pt connected however did not have mic access, proving pt unable to be heard on call. Office personnel contacted pt switching future scheduled visits to in-person and rescheduling today's visit to later this week.   Patsi Boots, MSW, LCSW 03/10/2024,  2:47 PM

## 2024-03-12 ENCOUNTER — Ambulatory Visit (INDEPENDENT_AMBULATORY_CARE_PROVIDER_SITE_OTHER): Payer: MEDICAID | Admitting: Licensed Clinical Social Worker

## 2024-03-12 DIAGNOSIS — Z91199 Patient's noncompliance with other medical treatment and regimen due to unspecified reason: Secondary | ICD-10-CM

## 2024-03-12 NOTE — Progress Notes (Signed)
 THERAPIST PROGRESS NOTE   Session Date: 03/12/2024  Session Time: 1500  Patient no-showed today's appointment; appointment was for follow up therapy. Pt did not provider prior notification of needing to cancel/reschedule today's appt.    Patsi Boots, MSW, LCSW 03/12/2024,  3:28 PM

## 2024-03-24 ENCOUNTER — Ambulatory Visit (HOSPITAL_COMMUNITY): Payer: MEDICAID | Admitting: Licensed Clinical Social Worker

## 2024-04-02 ENCOUNTER — Ambulatory Visit (INDEPENDENT_AMBULATORY_CARE_PROVIDER_SITE_OTHER): Payer: MEDICAID | Admitting: Licensed Clinical Social Worker

## 2024-04-02 DIAGNOSIS — F411 Generalized anxiety disorder: Secondary | ICD-10-CM | POA: Diagnosis not present

## 2024-04-02 DIAGNOSIS — F332 Major depressive disorder, recurrent severe without psychotic features: Secondary | ICD-10-CM

## 2024-04-02 NOTE — Progress Notes (Unsigned)
 THERAPIST PROGRESS NOTE   Session Date: 04/02/2024  Session Time: 1510 - 1610  Participation Level: Active  Behavioral Response: CasualAlertAnxious and Euthymic  Type of Therapy: Individual Therapy  Treatment Goals addressed:  - LTG: Reduce frequency, intensity, and duration of depression symptoms so that daily functioning is improved (OP Depression) - LTG: Increase coping skills to manage depression and improve ability to perform daily activities (OP Depression) - LTG: I want to be able to talk about my past without becoming so agitated and upset (OP Depression) - STG: Report a decrease in anxiety symptoms as evidenced by an overall reduction in anxiety score by a minimum of 25% on the Generalized Anxiety Disorder Scale (GAD-7) (Anxiety) - STG: Jack [name] will reduce frequency of avoidant behaviors by 50% as evidenced by self-report in therapy sessions (Anxiety) - LTG: To be able to function and not be so worried about everything (Anxiety)  ProgressTowards Goals: Progressing  Interventions: CBT, Motivational Interviewing, and Supportive  Summary: [name] is a 58 y.o. male with past psych history of MDD, PTSD, and GAD, presenting for follow-up therapy in efforts to improve management of presenting symptoms.  Patient actively engaged in session, presenting in pleasant moods and congruent affect throughout duration of visit, with minimal instances of anxiousness or distress.  Patient engaged in introductory check-in, sharing of having gotten disability approved recently, ***   things to be maintaining over the past month, and detailing of having canceled prior scheduled appointment 2 weeks ago due to a funeral.  Patient shared of having noticed mild improvements in moods with recent medication adjustments over the past months.  Patient actively engaged in detailing recent events related to knee injury, expressing of being thankful for injury to right knee to be only a bone bruise  rather than a potential tear.  Further engaged in reflection of recent PCP appointment in processing of GAD-7 and PHQ-9 screenings, noting of improvements in downward trend over recent months.  Actively engaged in review of individualized treatment goals outlined in treatment plan, exploring progress and/or lack thereof thus far throughout treatment, and areas for continued work throughout future course of treatment.  Patient responded well to interventions. Patient continues to meet criteria for MDD, PTSD, and GAD. Patient will continue to benefit from engagement in outpatient therapy due to being the least restrictive service to meet presenting needs.     04/02/2024    3:38 PM 01/28/2024    9:28 AM 12/25/2023    2:29 PM 12/11/2023    2:17 PM  GAD 7 : Generalized Anxiety Score  Nervous, Anxious, on Edge 1 2 3 2   Control/stop worrying 2 2 3 2   Worry too much - different things 2 2 3 1   Trouble relaxing 2 1 3 2   Restless 1 2 2 2   Easily annoyed or irritable 3 2 3 1   Afraid - awful might happen 1 1 1 1   Total GAD 7 Score 12 12 18 11   Anxiety Difficulty Extremely difficult Somewhat difficult Extremely difficult Extremely difficult      04/02/2024    3:43 PM 01/28/2024    9:28 AM 12/25/2023    2:33 PM 12/11/2023    2:28 PM 11/27/2023    2:08 PM  Depression screen PHQ 2/9  Decreased Interest 1 1 2 2 1   Down, Depressed, Hopeless 1 2 3 2 2   PHQ - 2 Score 2 3 5 4 3   Altered sleeping 3 2 2 1 1   Tired, decreased energy 2 1 2  2  1  Change in appetite 1 1 2 1  0  Feeling bad or failure about yourself  1 2 3 3 1   Trouble concentrating 2 1 3 2 1   Moving slowly or fidgety/restless 1 2 3 1  0  Suicidal thoughts 0 0 0 0 0  PHQ-9 Score 12 12 20 14 7   Difficult doing work/chores Extremely dIfficult Very difficult Extremely dIfficult Very difficult Somewhat difficult   Suicidal/Homicidal: No  Therapist Response: Clinician utilized CBT, MI and supportive reflection interventions to address presenting  stressors and MH sxs.  Clinician openly agreed the patient, engaging the patient in introductory check-in, assessing presenting mood and affect and further encouraging patient's reflection of recent events and factors contributing to presenting moves.  Actively listened the patient's reflections of recent events and progressions surrounding prior identified stressor, specific to injury, utilizing supportive listening, validating patient expressed thoughts and feelings.  Actively listened to the patient's reflections of recent events, utilizing open-ended questions to further explore patient's thoughts, feelings, and perspectives surrounding events and presenting stressors.  Actively engaged patient in 6 months review of individualized treatment goals outlined in treatment plan, processing progress and/or lack thereof, and desired areas for continued work.  Clinician reassessed severity of depressive and anxious sxs, and presence of any safety concerns. Therapist provided support and empathy to patient during session.  Plan: Return again in 2 weeks.  Review efforts towards incorporating journaling thoughts and feelings into routine, and how has proven to support in managing stress.  Diagnosis:  Encounter Diagnoses  Name Primary?   GAD (generalized anxiety disorder) Yes   Severe episode of recurrent major depressive disorder, without psychotic features (HCC)       Collaboration of Care: Psychiatrist AEB provider notes available in EHR.  Patient/Guardian was advised Release of Information must be obtained prior to any record release in order to collaborate their care with an outside provider. Patient/Guardian was advised if they have not already done so to contact the registration department to sign all necessary forms in order for us  to release information regarding their care.   Consent: Patient/Guardian gives verbal consent for treatment and assignment of benefits for services provided during  this visit. Patient/Guardian expressed understanding and agreed to proceed.   [name], MSW, LCSW 04/02/2024,  3:47 PM

## 2024-04-07 ENCOUNTER — Ambulatory Visit (HOSPITAL_COMMUNITY): Payer: MEDICAID | Admitting: Licensed Clinical Social Worker

## 2024-04-24 ENCOUNTER — Ambulatory Visit (HOSPITAL_COMMUNITY): Payer: MEDICAID | Admitting: Licensed Clinical Social Worker

## 2024-04-24 DIAGNOSIS — F332 Major depressive disorder, recurrent severe without psychotic features: Secondary | ICD-10-CM | POA: Diagnosis not present

## 2024-04-24 DIAGNOSIS — F411 Generalized anxiety disorder: Secondary | ICD-10-CM

## 2024-04-24 NOTE — Progress Notes (Signed)
 THERAPIST PROGRESS NOTE   Session Date: 04/24/2024  Session Time: 1515 - 1612  Participation Level: Active  Behavioral Response: CasualAlertEuthymic  Type of Therapy: Individual Therapy  Treatment Goals addressed:  - LTG: Reduce frequency, intensity, and duration of depression symptoms so that daily functioning is improved (OP Depression) - LTG: Increase coping skills to manage depression and improve ability to perform daily activities (OP Depression) - LTG: I want to be able to talk about my past without becoming so agitated and upset (OP Depression) - STG: Report a decrease in anxiety symptoms as evidenced by an overall reduction in anxiety score by a minimum of 25% on the Generalized Anxiety Disorder Scale (GAD-7) (Anxiety) - STG: Jack Jack [name] will reduce frequency of avoidant behaviors by 50% as evidenced by self-report in therapy sessions (Anxiety) - LTG: To be able to function and not be so worried about everything (Anxiety)  ProgressTowards Goals: Progressing  Interventions: CBT, Motivational Interviewing, and Supportive  Summary: Jack [name] is a 58 y.o. male with past psych history of MDD, PTSD, and GAD, presenting for follow-up therapy in efforts to improve management of presenting symptoms.  Patient actively engaged in session, presenting in pleasant moods and congruent affect throughout duration of visit.  Patient engaged in introductory check-in, sharing of doing alright, detailing of continued challenges with knee, with gradual improvements. Pt actively engaged in reflection of events and factors contributing to presenting moods over recent weeks, sharing of having recently cut girlfriends grass as a simple act of kindness, further reflecting on importance of kindness and treating people right, sharing of having been raised to do so. Explored pt's recent engagement in activities outside of the home, sharing of having not ridden trike much due to weather challenges.  Actively engaged in exploration of 'Cognitive Distortion' handout, processing how repetitive, irrational thoughts, prove to support distorted perspectives, which further supports and/or reinforces negative thought patterns, exploration various different distorted cognitions and how these prove to shape viewpoints and perspectives of self, others, events, and world as a whole.  Patient responded well to interventions. Patient continues to meet criteria for MDD, PTSD, and GAD. Patient will continue to benefit from engagement in outpatient therapy due to being the least restrictive service to meet presenting needs.     04/02/2024    3:38 PM 01/28/2024    9:28 AM 12/25/2023    2:29 PM 12/11/2023    2:17 PM  GAD 7 : Generalized Anxiety Score  Nervous, Anxious, on Edge 1 2 3 2   Control/stop worrying 2 2 3 2   Worry too much - different things 2 2 3 1   Trouble relaxing 2 1 3 2   Restless 1 2 2 2   Easily annoyed or irritable 3 2 3 1   Afraid - awful might happen 1 1 1 1   Total GAD 7 Score 12 12 18 11   Anxiety Difficulty Extremely difficult Somewhat difficult Extremely difficult Extremely difficult      04/02/2024    3:43 PM 01/28/2024    9:28 AM 12/25/2023    2:33 PM 12/11/2023    2:28 PM 11/27/2023    2:08 PM  Depression screen PHQ 2/9  Decreased Interest 1 1 2 2 1   Down, Depressed, Hopeless 1 2 3 2 2   PHQ - 2 Score 2 3 5 4 3   Altered sleeping 3 2 2 1 1   Tired, decreased energy 2 1 2 2 1   Change in appetite 1 1 2 1  0  Feeling bad or failure about yourself  1  2 3 3 1   Trouble concentrating 2 1 3 2 1   Moving slowly or fidgety/restless 1 2 3 1  0  Suicidal thoughts 0 0 0 0 0  PHQ-9 Score 12 12 20 14 7   Difficult doing work/chores Extremely dIfficult Very difficult Extremely dIfficult Very difficult Somewhat difficult   Suicidal/Homicidal: No  Therapist Response: Clinician utilized CBT, MI and supportive reflection interventions to address presenting stressors and MH sxs.  Clinician actively  greeted patient upon presenting for today's visit, assessing presenting moods and affect, and engaging in introductory check-in. Utilized open-ended questions in order to prompt patient's recounts of the events of the past 3 weeks, and factors contributing to presenting moods. Actively listened to patient's recounts of recent events, supporting patient and processing of thoughts and feelings surrounding ongoing stressors. Provided patient with cognitive distortions handout, supporting patient and processing understanding of relationship between distorted cognitions and perspectives, and outlook towards self, others, and overall world, further supporting patient and processing how negative, and/or irrational thoughts prove to be supported by cognitive distortions, as well as themselves proving to reinforce negative thought patterns.  Clinician reassessed severity of depressive and anxious sxs, and presence of any safety concerns. Therapist provided support and empathy to patient during session.  Homework: Continue exploration of activities and/or hobbies of interest to increase daily activity and socialization.  Plan: Return again in 4 weeks.  Review efforts towards incorporating journaling thoughts and feelings into routine, and how has proven to support in managing stress.  Diagnosis:  Encounter Diagnoses  Name Primary?   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  Patient/Guardian was advised Release of Information must be obtained prior to any record release in order to collaborate their care with an outside provider. Patient/Guardian was advised if they have not already done so to contact the registration department to sign all necessary forms in order for us  to release information regarding their care.   Consent:  Patient/Guardian gives verbal consent for treatment and assignment of benefits for services provided during this visit. Patient/Guardian expressed understanding and agreed to proceed.   [name], MSW, LCSW 04/24/2024,  3:18 PM

## 2024-05-07 ENCOUNTER — Ambulatory Visit (INDEPENDENT_AMBULATORY_CARE_PROVIDER_SITE_OTHER): Payer: MEDICAID | Admitting: Licensed Clinical Social Worker

## 2024-05-07 DIAGNOSIS — F411 Generalized anxiety disorder: Secondary | ICD-10-CM | POA: Diagnosis not present

## 2024-05-07 DIAGNOSIS — F332 Major depressive disorder, recurrent severe without psychotic features: Secondary | ICD-10-CM

## 2024-05-07 NOTE — Progress Notes (Unsigned)
 THERAPIST PROGRESS NOTE   Session Date: 05/07/2024  Session Time: 1305 - 1402  Participation Level: Active  Behavioral Response: CasualAlertEuthymic  Type of Therapy: Individual Therapy  Treatment Goals addressed:  - LTG: Reduce frequency, intensity, and duration of depression symptoms so that daily functioning is improved (OP Depression) - LTG: Increase coping skills to manage depression and improve ability to perform daily activities (OP Depression) - LTG: I want to be able to talk about my past without becoming so agitated and upset (OP Depression) - STG: Report a decrease in anxiety symptoms as evidenced by an overall reduction in anxiety score by a minimum of 25% on the Generalized Anxiety Disorder Scale (GAD-7) (Anxiety) - STG: Jack [name] will reduce frequency of avoidant behaviors by 50% as evidenced by self-report in therapy sessions (Anxiety) - LTG: To be able to function and not be so worried about everything (Anxiety)  ProgressTowards Goals: Progressing  Interventions: CBT, Motivational Interviewing, and Supportive  Summary: [name] is a 58 y.o. male with past psych history of MDD, PTSD, and GAD, presenting for follow-up therapy in efforts to improve management of presenting symptoms.  Patient actively engaged in session, presenting in pleasant moods and congruent affect throughout duration of visit.  Patient engaged in introductory check-in, sharing of doing well overall, detailing of struggling with back pain today, sharing of having helped care for aunt last night and possible discomfort from having been seating most of the night. Actively engaged in detailing recounts of recent events, sharing of having taken trike out over the past week and enjoyed ride, however finding the weather temperatures being too hot to find enjoyment in ride. Pt further processed therapeutic value in riding motorcycle/trike, observing greater relaxation following ride. Actively engaged in  reassessing presenting depressive and anxious sxs observed/experienced over the past two weeks via PHQ-9 and GAD-7, noting of reduction in depressive sxs to mild range, with maintained observed anxious sxs within moderate range. Actively engaged in processing hx of anxiety ranging back to childhood and sxs being typical of pt day-to-day functioning.  Actively engaged in reflection of day-to-day routine, exploring patient's sleep habits, dietary habits, and implications of poor sleep hygiene and poor diet on depression, anxiety, and overall moods.  Explored importance of prioritizing self, and efforts to increase self-care, self worth, and overall management of symptoms.  Patient responded well to interventions. Patient continues to meet criteria for MDD, PTSD, and GAD. Patient will continue to benefit from engagement in outpatient therapy due to being the least restrictive service to meet presenting needs.     05/07/2024    1:32 PM 04/02/2024    3:38 PM 01/28/2024    9:28 AM 12/25/2023    2:29 PM  GAD 7 : Generalized Anxiety Score  Nervous, Anxious, on Edge 2 1 2 3   Control/stop worrying 2 2 2 3   Worry too much - different things 1 2 2 3   Trouble relaxing 2 2 1 3   Restless 3 1 2 2   Easily annoyed or irritable 2 3 2 3   Afraid - awful might happen 0 1 1 1   Total GAD 7 Score 12 12 12 18   Anxiety Difficulty Very difficult Extremely difficult Somewhat difficult Extremely difficult      05/07/2024    1:25 PM 04/02/2024    3:43 PM 01/28/2024    9:28 AM 12/25/2023    2:33 PM 12/11/2023    2:28 PM  Depression screen PHQ 2/9  Decreased Interest 1 1 1 2 2   Down, Depressed,  Hopeless 1 1 2 3 2   PHQ - 2 Score 2 2 3 5 4   Altered sleeping 2 3 2 2 1   Tired, decreased energy 1 2 1 2 2   Change in appetite 1 1 1 2 1   Feeling bad or failure about yourself  1 1 2 3 3   Trouble concentrating 1 2 1 3 2   Moving slowly or fidgety/restless 0 1 2 3 1   Suicidal thoughts 0 0 0 0 0  PHQ-9 Score 8 12 12 20 14    Difficult doing work/chores Somewhat difficult Extremely dIfficult Very difficult Extremely dIfficult Very difficult   Suicidal/Homicidal: No  Therapist Response: Clinician utilized CBT, MI and supportive reflection interventions to address presenting stressors and MH sxs.  Clinician actively greeted patient upon presenting for today's visit, assessing presenting mood and affect. Openly engaged patient in introductory check-in, prompting recounts of how patient has been doing over the past 2 weeks, factors contributing to presenting moods, and exploration of experience challenges and/or concerns. Actively listened the patient's recounts of the events of the past 2 weeks, utilizing CBT in aims of supporting patient and processing therapeutic nature of writing motorcycle/trike. Utilized open-ended questions to further elicit patient's thoughts feelings and perspectives in relation to recent events, aiding and exploration of recent challenges and experienced symptoms. Engage patient in review of symptoms experienced over the past 2 weeks via PHQ-9 and GAD-7, further exploring current screening scores, noted reduction in depression screening, and general maintained moderate anxiousness reflected in GAD-7, actively processing patient's thoughts and perspectives surrounding such an individual efforts at managing symptoms. Utilized CBT and psycho Ed and efforts of supporting patient and processing thoughts and feelings surrounding lack of routine, and the implications of lacking routine has for depression and anxiety and overall moods. Provided support and validation for patient's expressed thoughts and feelings, utilizing MI techniques to aid in processing importance for patient to establish routine engagement in hobbies and activities he enjoys to support and management of symptoms.  Clinician reassessed severity of depressive and anxious sxs, and presence of any safety concerns. Therapist provided  support and empathy to patient during session.  Homework: Continue exploration of activities and/or hobbies of interest to increase daily activity, development of routine, implementation of self-care, and overall improvements of moods.  Plan: Return again in 2 weeks.  Review efforts towards incorporating journaling thoughts and feelings into routine, and how has proven to support in managing stress.  Diagnosis:  Encounter Diagnoses  Name Primary?   GAD (generalized anxiety disorder) Yes   Severe episode of recurrent major depressive disorder, without psychotic features (HCC)      Collaboration of Care: Psychiatrist AEB provider notes available in EHR.  Patient/Guardian was advised Release of Information must be obtained prior to any record release in order to collaborate their care with an outside provider. Patient/Guardian was advised if they have not already done so to contact the registration department to sign all necessary forms in order for us  to release information regarding their care.   Consent: Patient/Guardian gives verbal consent for treatment and assignment of benefits for services provided during this visit. Patient/Guardian expressed understanding and agreed to proceed.   [name], MSW, LCSW 05/07/2024,  1:35 PM

## 2024-05-21 ENCOUNTER — Encounter: Payer: MEDICAID | Admitting: Family Medicine

## 2024-05-21 DIAGNOSIS — E669 Obesity, unspecified: Secondary | ICD-10-CM | POA: Insufficient documentation

## 2024-05-21 DIAGNOSIS — I10 Essential (primary) hypertension: Secondary | ICD-10-CM | POA: Insufficient documentation

## 2024-05-21 NOTE — Progress Notes (Deleted)
 Jack [name] is a 58 y.o. male presents to office today for annual physical exam examination.    Concerns today include: 1. ***  Occupation: ***, Marital status: ***, Substance use: *** Health Maintenance Due  Topic Date Due   Hepatitis B Vaccines (1 of 3 - 19+ 3-dose series) Never done   Pneumococcal Vaccine: 50+ Years (1 of 1 - PCV) Never done   Zoster Vaccines- Shingrix (1 of 2) Never done   COVID-19 Vaccine (1 - 2024-25 season) Never done   INFLUENZA VACCINE  05/09/2024   Refills needed today: ***  Immunization History  Administered Date(s) Administered   Tdap 01/01/2012, 07/24/2023   Past Medical History:  Diagnosis Date   Anxiety    Arthritis    GERD (gastroesophageal reflux disease)    Headache    occasional    History of kidney stones    Social History   Socioeconomic History   Marital status: Single    Spouse name: Not on file   Number of children: Not on file   Years of education: Not on file   Highest education level: 12th grade  Occupational History   Not on file  Tobacco Use   Smoking status: Never   Smokeless tobacco: Former    Types: Chew  Vaping Use   Vaping status: Never Used  Substance and Sexual Activity   Alcohol use: Not Currently    Alcohol/week: 0.0 standard drinks of alcohol    Comment: occasional    Drug use: No   Sexual activity: Not on file  Other Topics Concern   Not on file  Social History Narrative   Not on file   Social Drivers of Health   Financial Resource Strain: High Risk (10/26/2023)   Overall Financial Resource Strain (CARDIA)    Difficulty of Paying Living Expenses: Hard  Food Insecurity: Patient Declined (10/26/2023)   Hunger Vital Sign    Worried About Running Out of Food in the Last Year: Patient declined    Ran Out of Food in the Last Year: Patient declined  Transportation Needs: Unmet Transportation Needs (10/26/2023)   PRAPARE - Transportation    Lack of Transportation (Medical): No    Lack of  Transportation (Non-Medical): Yes  Physical Activity: Unknown (10/26/2023)   Exercise Vital Sign    Days of Exercise per Week: Patient declined    Minutes of Exercise per Session: Not on file  Stress: Stress Concern Present (10/26/2023)   [employer] of Occupational Health - Occupational Stress Questionnaire    Feeling of Stress : Very much  Social Connections: Unknown (10/26/2023)   Social Connection and Isolation Panel    Frequency of Communication with Friends and Family: Patient declined    Frequency of Social Gatherings with Friends and Family: Patient declined    Attends Religious Services: Patient declined    Active Member of Clubs or Organizations: No    Attends Engineer, structural: Not on file    Marital Status: Divorced  Intimate Partner Violence: Not on file   Past Surgical History:  Procedure Laterality Date   BIOPSY  04/23/2023   Procedure: BIOPSY;  Surgeon: Cindie Carlin POUR, DO;  Location: [facility];  Service: Endoscopy;;   COLONOSCOPY WITH PROPOFOL  N/A 04/23/2023   Procedure: COLONOSCOPY WITH PROPOFOL ;  Surgeon: Cindie Carlin POUR, DO;  Location: [facility];  Service: Endoscopy;  Laterality: N/A;  9:00 AM, A[facility] 3   EXTRACORPOREAL SHOCK WAVE LITHOTRIPSY Right 05/30/2018   Procedure: RIGHT EXTRACORPOREAL  SHOCK WAVE LITHOTRIPSY (ESWL);  Surgeon: Cam Morene ORN, MD;  Location: [facility];  Service: Urology;  Laterality: Right;   KNEE ARTHROSCOPY Right    KNEE SURGERY Left    3 arthroscopic,  two ACL reconstuctions   SHOULDER SURGERY Right    dislocation repair with a pin   TONSILLECTOMY AND ADENOIDECTOMY     TOTAL KNEE ARTHROPLASTY Left 07/12/2015   Procedure: TOTAL LEFT  KNEE ARTHROPLASTY;  Surgeon: Dempsey Moan, MD;  Location: [facility];  Service: Orthopedics;  Laterality: Left;   tubes in ears     Family History  Problem Relation Age of Onset   Arthritis Mother     Current Outpatient Medications:    DULoxetine  (CYMBALTA ) 30 MG capsule, Take 3  capsules (90 mg total) by mouth daily. **new dose, Disp: 270 capsule, Rfl: 3   ibuprofen  (ADVIL ) 400 MG tablet, Take 1 tablet (400 mg total) by mouth every 8 (eight) hours as needed for moderate pain or mild pain., Disp: 30 tablet, Rfl: 0   prazosin  (MINIPRESS ) 1 MG capsule, Take 2 capsules (2 mg total) by mouth at bedtime., Disp: 60 capsule, Rfl: 0   pregabalin (LYRICA) 100 MG capsule, Take 100 mg by mouth at bedtime., Disp: , Rfl:    tiZANidine (ZANAFLEX) 2 MG tablet, Take 2 mg by mouth daily., Disp: , Rfl:   Allergies  Allergen Reactions   Tramadol  Nausea And Vomiting     ROS: Review of Systems {ros; complete:30496}    Physical exam {Exam, [phone]}      05/07/2024    1:25 PM 04/02/2024    3:43 PM 01/28/2024    9:28 AM  Depression screen PHQ 2/9  Decreased Interest   1  Down, Depressed, Hopeless   2  PHQ - 2 Score   3  Altered sleeping   2  Tired, decreased energy   1  Change in appetite   1  Feeling bad or failure about yourself    2  Trouble concentrating   1  Moving slowly or fidgety/restless   2  Suicidal thoughts   0  PHQ-9 Score   12  Difficult doing work/chores   Very difficult     Information is confidential and restricted. Go to Review Flowsheets to unlock data.      05/07/2024    1:32 PM 04/02/2024    3:38 PM 01/28/2024    9:28 AM 12/25/2023    2:29 PM  GAD 7 : Generalized Anxiety Score  Nervous, Anxious, on Edge   2   Control/stop worrying   2   Worry too much - different things   2   Trouble relaxing   1   Restless   2   Easily annoyed or irritable   2   Afraid - awful might happen   1   Total GAD 7 Score   12   Anxiety Difficulty   Somewhat difficult      Information is confidential and restricted. Go to Review Flowsheets to unlock data.     Assessment/ Plan: Jack [name] here for annual physical exam.   Annual physical exam  Vitamin D  deficiency  Obesity (BMI 30-39.9)  Essential hypertension  Need for pneumococcal  vaccination  Severe episode of recurrent major depressive disorder, without psychotic features (HCC)  Screening, anemia, deficiency, iron  Screening for malignant neoplasm of prostate  ***  Counseled on healthy lifestyle choices, including diet (rich in fruits, vegetables and lean meats and low in salt and  simple carbohydrates) and exercise (at least 30 minutes of moderate physical activity daily).  Patient to follow up ***  Neshia Mckenzie M. Jolinda, DO

## 2024-05-21 NOTE — Progress Notes (Unsigned)
 Jack [name] is a 58 y.o. male presents to office today for annual physical exam examination.    Concerns today include: 1. ***  Occupation: ***, Marital status: ***, Substance use: *** Health Maintenance Due  Topic Date Due   Hepatitis B Vaccines (1 of 3 - 19+ 3-dose series) Never done   Pneumococcal Vaccine: 50+ Years (1 of 1 - PCV) Never done   Zoster Vaccines- Shingrix (1 of 2) Never done   COVID-19 Vaccine (1 - 2024-25 season) Never done   INFLUENZA VACCINE  05/09/2024   Refills needed today: ***  Immunization History  Administered Date(s) Administered   Tdap 01/01/2012, 07/24/2023   Past Medical History:  Diagnosis Date   Anxiety    Arthritis    GERD (gastroesophageal reflux disease)    Headache    occasional    History of kidney stones    Social History   Socioeconomic History   Marital status: Single    Spouse name: Not on file   Number of children: Not on file   Years of education: Not on file   Highest education level: 12th grade  Occupational History   Not on file  Tobacco Use   Smoking status: Never   Smokeless tobacco: Former    Types: Chew  Vaping Use   Vaping status: Never Used  Substance and Sexual Activity   Alcohol use: Not Currently    Alcohol/week: 0.0 standard drinks of alcohol    Comment: occasional    Drug use: No   Sexual activity: Not on file  Other Topics Concern   Not on file  Social History Narrative   Not on file   Social Drivers of Health   Financial Resource Strain: High Risk (10/26/2023)   Overall Financial Resource Strain (CARDIA)    Difficulty of Paying Living Expenses: Hard  Food Insecurity: Patient Declined (10/26/2023)   Hunger Vital Sign    Worried About Running Out of Food in the Last Year: Patient declined    Ran Out of Food in the Last Year: Patient declined  Transportation Needs: Unmet Transportation Needs (10/26/2023)   PRAPARE - Transportation    Lack of Transportation (Medical): No    Lack of  Transportation (Non-Medical): Yes  Physical Activity: Unknown (10/26/2023)   Exercise Vital Sign    Days of Exercise per Week: Patient declined    Minutes of Exercise per Session: Not on file  Stress: Stress Concern Present (10/26/2023)   [employer] of Occupational Health - Occupational Stress Questionnaire    Feeling of Stress : Very much  Social Connections: Unknown (10/26/2023)   Social Connection and Isolation Panel    Frequency of Communication with Friends and Family: Patient declined    Frequency of Social Gatherings with Friends and Family: Patient declined    Attends Religious Services: Patient declined    Active Member of Clubs or Organizations: No    Attends Engineer, structural: Not on file    Marital Status: Divorced  Intimate Partner Violence: Not on file   Past Surgical History:  Procedure Laterality Date   BIOPSY  04/23/2023   Procedure: BIOPSY;  Surgeon: Cindie Carlin POUR, DO;  Location: [facility];  Service: Endoscopy;;   COLONOSCOPY WITH PROPOFOL  N/A 04/23/2023   Procedure: COLONOSCOPY WITH PROPOFOL ;  Surgeon: Cindie Carlin POUR, DO;  Location: [facility];  Service: Endoscopy;  Laterality: N/A;  9:00 AM, ASA 3   EXTRACORPOREAL SHOCK WAVE LITHOTRIPSY Right 05/30/2018   Procedure: RIGHT EXTRACORPOREAL  SHOCK WAVE LITHOTRIPSY (ESWL);  Surgeon: Cam Morene ORN, MD;  Location: [facility];  Service: Urology;  Laterality: Right;   KNEE ARTHROSCOPY Right    KNEE SURGERY Left    3 arthroscopic,  two ACL reconstuctions   SHOULDER SURGERY Right    dislocation repair with a pin   TONSILLECTOMY AND ADENOIDECTOMY     TOTAL KNEE ARTHROPLASTY Left 07/12/2015   Procedure: TOTAL LEFT  KNEE ARTHROPLASTY;  Surgeon: Dempsey Moan, MD;  Location: [facility];  Service: Orthopedics;  Laterality: Left;   tubes in ears     Family History  Problem Relation Age of Onset   Arthritis Mother     Current Outpatient Medications:    DULoxetine  (CYMBALTA ) 30 MG capsule, Take 3  capsules (90 mg total) by mouth daily. **new dose, Disp: 270 capsule, Rfl: 3   ibuprofen  (ADVIL ) 400 MG tablet, Take 1 tablet (400 mg total) by mouth every 8 (eight) hours as needed for moderate pain or mild pain., Disp: 30 tablet, Rfl: 0   prazosin  (MINIPRESS ) 1 MG capsule, Take 2 capsules (2 mg total) by mouth at bedtime., Disp: 60 capsule, Rfl: 0   pregabalin (LYRICA) 100 MG capsule, Take 100 mg by mouth at bedtime., Disp: , Rfl:    tiZANidine (ZANAFLEX) 2 MG tablet, Take 2 mg by mouth daily., Disp: , Rfl:   Allergies  Allergen Reactions   Tramadol  Nausea And Vomiting     ROS: Review of Systems {ros; complete:30496}    Physical exam {Exam, Complete:[phone]}      05/07/2024    1:25 PM 04/02/2024    3:43 PM 01/28/2024    9:28 AM  Depression screen PHQ 2/9  Decreased Interest   1  Down, Depressed, Hopeless   2  PHQ - 2 Score   3  Altered sleeping   2  Tired, decreased energy   1  Change in appetite   1  Feeling bad or failure about yourself    2  Trouble concentrating   1  Moving slowly or fidgety/restless   2  Suicidal thoughts   0  PHQ-9 Score   12  Difficult doing work/chores   Very difficult     Information is confidential and restricted. Go to Review Flowsheets to unlock data.      05/07/2024    1:32 PM 04/02/2024    3:38 PM 01/28/2024    9:28 AM 12/25/2023    2:29 PM  GAD 7 : Generalized Anxiety Score  Nervous, Anxious, on Edge   2   Control/stop worrying   2   Worry too much - different things   2   Trouble relaxing   1   Restless   2   Easily annoyed or irritable   2   Afraid - awful might happen   1   Total GAD 7 Score   12   Anxiety Difficulty   Somewhat difficult      Information is confidential and restricted. Go to Review Flowsheets to unlock data.     Assessment/ Plan: Jack [name] here for annual physical exam.   Annual physical exam  Vitamin D  deficiency  Obesity (BMI 30-39.9)  Essential hypertension  Need for pneumococcal  vaccination  Severe episode of recurrent major depressive disorder, without psychotic features (HCC)  Screening, anemia, deficiency, iron  Screening for malignant neoplasm of prostate  ***  Counseled on healthy lifestyle choices, including diet (rich in fruits, vegetables and lean meats and low in salt and  simple carbohydrates) and exercise (at least 30 minutes of moderate physical activity daily).  Patient to follow up ***  Matthewjames Petrasek M. Jolinda, DO

## 2024-05-22 ENCOUNTER — Ambulatory Visit (INDEPENDENT_AMBULATORY_CARE_PROVIDER_SITE_OTHER): Payer: MEDICAID | Admitting: Licensed Clinical Social Worker

## 2024-05-22 DIAGNOSIS — F411 Generalized anxiety disorder: Secondary | ICD-10-CM

## 2024-05-22 DIAGNOSIS — F332 Major depressive disorder, recurrent severe without psychotic features: Secondary | ICD-10-CM

## 2024-05-22 NOTE — Progress Notes (Unsigned)
 THERAPIST PROGRESS NOTE   Session Date: 05/22/2024  Session Time: 1410 - 1510  Participation Level: Active  Behavioral Response: CasualAlertEuthymic  Type of Therapy: Individual Therapy  Treatment Goals addressed:  - LTG: Reduce frequency, intensity, and duration of depression symptoms so that daily functioning is improved (OP Depression) - LTG: Increase coping skills to manage depression and improve ability to perform daily activities (OP Depression) - LTG: I want to be able to talk about my past without becoming so agitated and upset (OP Depression) - STG: Report a decrease in anxiety symptoms as evidenced by an overall reduction in anxiety score by a minimum of 25% on the Generalized Anxiety Disorder Scale (GAD-7) (Anxiety) - STG: Jack [name] will reduce frequency of avoidant behaviors by 50% as evidenced by self-report in therapy sessions (Anxiety) - LTG: To be able to function and not be so worried about everything (Anxiety)  ProgressTowards Goals: Progressing  Interventions: CBT, Motivational Interviewing, and Supportive  Summary: [name] is a 58 y.o. male with past psych history of MDD, PTSD, and GAD, presenting for follow-up therapy in efforts to improve management of presenting symptoms.  Patient actively engaged in session, presenting in primarily pleasant moods and congruent affect overall, with periods of sadness and tearfulness exhibited throughout duration of visit.  Patient engaged in introductory check-in, sharing of things being about the same as it always is I guess. Pt proved receptive to engaging in review of treatment progress in relation to individualized goals outlined in tx plan during today's visit, engaging in reassessing of presenting depressive and anxious sxs, further exploring observed variances in scores with increase in depressive sxs. Extensively processed noted challenges surrounding sleeping too much and/or too little, revisiting implications lack  of sleep has on abilities and desires to perform tasks, eat healthily, engage with others, observed increased irritability when engaging with others, and overall poor moods. Pt shared of recent challenges navigating loss of friend who passed from cancer over the past two weeks, further sharing of additional loss of family members and other individuals pt has been friends with, and the difficulties pt has with accepting losses. Actively engaged in assessing individual perspectives of progressions towards identified goals, noting of maintained, minimal progression, and identifying continued areas for work.   Patient responded well to interventions. Patient continues to meet criteria for MDD, PTSD, and GAD. Patient will continue to benefit from engagement in outpatient therapy due to being the least restrictive service to meet presenting needs.     05/22/2024    2:18 PM 05/07/2024    1:32 PM 04/02/2024    3:38 PM 01/28/2024    9:28 AM  GAD 7 : Generalized Anxiety Score  Nervous, Anxious, on Edge 2 2 1 2   Control/stop worrying 2 2 2 2   Worry too much - different things 1 1 2 2   Trouble relaxing 2 2 2 1   Restless 1 3 1 2   Easily annoyed or irritable 1 2 3 2   Afraid - awful might happen 1 0 1 1  Total GAD 7 Score 10 12 12 12   Anxiety Difficulty Somewhat difficult Very difficult Extremely difficult Somewhat difficult      05/22/2024    2:13 PM 05/07/2024    1:25 PM 04/02/2024    3:43 PM 01/28/2024    9:28 AM 12/25/2023    2:33 PM  Depression screen PHQ 2/9  Decreased Interest 2 1 1 1 2   Down, Depressed, Hopeless 2 1 1 2 3   PHQ - 2 Score 4  2 2 3 5   Altered sleeping 2 2 3 2 2   Tired, decreased energy 3 1 2 1 2   Change in appetite 1 1 1 1 2   Feeling bad or failure about yourself  2 1 1 2 3   Trouble concentrating 2 1 2 1 3   Moving slowly or fidgety/restless 0 0 1 2 3   Suicidal thoughts 0 0 0 0 0  PHQ-9 Score 14 8 12 12 20   Difficult doing work/chores Somewhat difficult Somewhat difficult  Extremely dIfficult Very difficult Extremely dIfficult   Suicidal/Homicidal: No  Therapist Response: Clinician utilized CBT, MI and supportive reflection interventions to address presenting stressors and MH sxs.  Clinician actively greeted patient upon presenting for today's visit, assessing presenting mood and affect. Engaged patient in introductory check-in, encouraging pt's recounts of events of recent weeks, and factors contributing to presenting moods.  Actively listened to pt's reflections of events, providing support and empathy for pt's identified feelings surrounding recent stressors.  Actively engaged in reassessing presenting depressive and anxious sxs via PHQ-9 and GAD-7, prompting further exploration of recent variances, and utilizing psychoed and CBT to support pt in processing implications of poor sleep on moods, abilities and desires to complete tasks, and eating habits. Engaged in 80mo review of current tx plan, exploring pt's perspectives of progressions towards goals and continued areas for work.  Clinician reassessed severity of depressive and anxious sxs, and presence of any safety concerns. Therapist provided support and empathy to patient during session.  Homework: Continue exploration of activities and/or hobbies of interest to increase daily activity, development of routine, implementation of self-care, and overall improvements of moods.  Plan: Return again in 2 weeks.  Review efforts towards incorporating journaling thoughts and feelings into routine, and how has proven to support in managing stress.  Diagnosis:  Encounter Diagnoses  Name Primary?   GAD (generalized anxiety disorder) Yes   Severe episode of recurrent major depressive disorder, without psychotic features (HCC)       Collaboration of Care: Psychiatrist AEB provider notes available in EHR.  Patient/Guardian was advised Release of Information must be obtained prior to any record release in order  to collaborate their care with an outside provider. Patient/Guardian was advised if they have not already done so to contact the registration department to sign all necessary forms in order for us  to release information regarding their care.   Consent: Patient/Guardian gives verbal consent for treatment and assignment of benefits for services provided during this visit. Patient/Guardian expressed understanding and agreed to proceed.   [name], MSW, LCSW 05/22/2024,  2:21 PM

## 2024-05-25 NOTE — Progress Notes (Incomplete)
 THERAPIST PROGRESS NOTE   Session Date: 05/22/2024  Session Time: 1410 - 1510  Participation Level: Active  Behavioral Response: CasualAlertEuthymic  Type of Therapy: Individual Therapy  Treatment Goals addressed:  - LTG: Reduce frequency, intensity, and duration of depression symptoms so that daily functioning is improved (OP Depression) - LTG: Increase coping skills to manage depression and improve ability to perform daily activities (OP Depression) - LTG: I want to be able to talk about my past without becoming so agitated and upset (OP Depression) - STG: Report a decrease in anxiety symptoms as evidenced by an overall reduction in anxiety score by a minimum of 25% on the Generalized Anxiety Disorder Scale (GAD-7) (Anxiety) - STG: Jack Jack [name] will reduce frequency of avoidant behaviors by 50% as evidenced by self-report in therapy sessions (Anxiety) - LTG: To be able to function and not be so worried about everything (Anxiety)  ProgressTowards Goals: Progressing  Interventions: CBT, Motivational Interviewing, and Supportive  Summary: Jack [name] is a 58 y.o. male with past psych history of MDD, PTSD, and GAD, presenting for follow-up therapy in efforts to improve management of presenting symptoms.  Patient actively engaged in session, presenting in primarily pleasant moods and congruent affect overall, with periods of sadness and tearfulness exhibited throughout duration of visit.  Patient engaged in introductory check-in, sharing of things being about the same as it always is I guess.       doing well overall, detailing of struggling with back pain today, sharing of having helped care for aunt last night and possible discomfort from having been seating most of the night. Actively engaged in detailing recounts of recent events, sharing of having taken trike out over the past week and enjoyed ride, however finding the weather temperatures being too hot to find enjoyment in ride.  Pt further processed therapeutic value in riding motorcycle/trike, observing greater relaxation following ride. Actively engaged in reassessing presenting depressive and anxious sxs observed/experienced over the past two weeks via PHQ-9 and GAD-7, noting of reduction in depressive sxs to mild range, with maintained observed anxious sxs within moderate range. Actively engaged in processing hx of anxiety ranging back to childhood and sxs being typical of pt day-to-day functioning.  Actively engaged in reflection of day-to-day routine, exploring patient's sleep habits, dietary habits, and implications of poor sleep hygiene and poor diet on depression, anxiety, and overall moods.  Explored importance of prioritizing self, and efforts to increase self-care, self worth, and overall management of symptoms.  Patient responded well to interventions. Patient continues to meet criteria for MDD, PTSD, and GAD. Patient will continue to benefit from engagement in outpatient therapy due to being the least restrictive service to meet presenting needs.     05/22/2024    2:18 PM 05/07/2024    1:32 PM 04/02/2024    3:38 PM 01/28/2024    9:28 AM  GAD 7 : Generalized Anxiety Score  Nervous, Anxious, on Edge 2 2 1 2   Control/stop worrying 2 2 2 2   Worry too much - different things 1 1 2 2   Trouble relaxing 2 2 2 1   Restless 1 3 1 2   Easily annoyed or irritable 1 2 3 2   Afraid - awful might happen 1 0 1 1  Total GAD 7 Score 10 12 12 12   Anxiety Difficulty Somewhat difficult Very difficult Extremely difficult Somewhat difficult      05/22/2024    2:13 PM 05/07/2024    1:25 PM 04/02/2024    3:43 PM 01/28/2024  9:28 AM 12/25/2023    2:33 PM  Depression screen PHQ 2/9  Decreased Interest 2 1 1 1 2   Down, Depressed, Hopeless 2 1 1 2 3   PHQ - 2 Score 4 2 2 3 5   Altered sleeping 2 2 3 2 2   Tired, decreased energy 3 1 2 1 2   Change in appetite 1 1 1 1 2   Feeling bad or failure about yourself  2 1 1 2 3   Trouble  concentrating 2 1 2 1 3   Moving slowly or fidgety/restless 0 0 1 2 3   Suicidal thoughts 0 0 0 0 0  PHQ-9 Score 14 8 12 12 20   Difficult doing work/chores Somewhat difficult Somewhat difficult Extremely dIfficult Very difficult Extremely dIfficult   Suicidal/Homicidal: No  Therapist Response: Clinician utilized CBT, MI and supportive reflection interventions to address presenting stressors and MH sxs.  Clinician actively greeted patient upon presenting for today's visit, assessing presenting mood and affect. Openly engaged patient in introductory check-in, prompting recounts of how patient has been doing over the past 2 weeks, factors contributing to presenting moods, and exploration of experience challenges and/or concerns. Actively listened the patient's recounts of the events of the past 2 weeks, utilizing CBT in aims of supporting patient and processing therapeutic nature of writing motorcycle/trike. Utilized open-ended questions to further elicit patient's thoughts feelings and perspectives in relation to recent events, aiding and exploration of recent challenges and experienced symptoms. Engage patient in review of symptoms experienced over the past 2 weeks via PHQ-9 and GAD-7, further exploring current screening scores, noted reduction in depression screening, and general maintained moderate anxiousness reflected in GAD-7, actively processing patient's thoughts and perspectives surrounding such an individual efforts at managing symptoms. Utilized CBT and psycho Ed and efforts of supporting patient and processing thoughts and feelings surrounding lack of routine, and the implications of lacking routine has for depression and anxiety and overall moods. Provided support and validation for patient's expressed thoughts and feelings, utilizing MI techniques to aid in processing importance for patient to establish routine engagement in hobbies and activities he enjoys to support and management of  symptoms.  Clinician reassessed severity of depressive and anxious sxs, and presence of any safety concerns. Therapist provided support and empathy to patient during session.  Homework: Continue exploration of activities and/or hobbies of interest to increase daily activity, development of routine, implementation of self-care, and overall improvements of moods.  Plan: Return again in 2 weeks.  Review efforts towards incorporating journaling thoughts and feelings into routine, and how has proven to support in managing stress.  Diagnosis:  Encounter Diagnoses  Name Primary?  SABRA GAD (generalized anxiety disorder) Yes  . Severe episode of recurrent major depressive disorder, without psychotic features (HCC)       Collaboration of Care: Psychiatrist AEB provider notes available in EHR.  Patient/Guardian was advised Release of Information must be obtained prior to any record release in order to collaborate their care with an outside provider. Patient/Guardian was advised if they have not already done so to contact the registration department to sign all necessary forms in order for us  to release information regarding their care.   Consent: Patient/Guardian gives verbal consent for treatment and assignment of benefits for services provided during this visit. Patient/Guardian expressed understanding and agreed to proceed.   Jack Jack [name], MSW, LCSW 05/22/2024,  2:21 PM

## 2024-06-05 ENCOUNTER — Ambulatory Visit (HOSPITAL_COMMUNITY): Payer: MEDICAID | Admitting: Licensed Clinical Social Worker

## 2024-06-05 DIAGNOSIS — F332 Major depressive disorder, recurrent severe without psychotic features: Secondary | ICD-10-CM | POA: Diagnosis not present

## 2024-06-05 DIAGNOSIS — F411 Generalized anxiety disorder: Secondary | ICD-10-CM | POA: Diagnosis not present

## 2024-06-05 NOTE — Progress Notes (Unsigned)
 THERAPIST PROGRESS NOTE   Session Date: 06/05/2024  Session Time: 1405 - 1503  Participation Level: Active  Behavioral Response: CasualAlertEuthymic  Type of Therapy: Individual Therapy  Treatment Goals addressed:  - LTG: Reduce frequency, intensity, and duration of depression symptoms so that daily functioning is improved (OP Depression) - LTG: Increase coping skills to manage depression and improve ability to perform daily activities (OP Depression) - LTG: I want to be able to talk about my past without becoming so agitated and upset (OP Depression) - STG: Report a decrease in anxiety symptoms as evidenced by an overall reduction in anxiety score by a minimum of 25% on the Generalized Anxiety Disorder Scale (GAD-7) (Anxiety) - STG: Ozell Boas will reduce frequency of avoidant behaviors by 50% as evidenced by self-report in therapy sessions (Anxiety) - LTG: To be able to function and not be so worried about everything (Anxiety)  ProgressTowards Goals: Progressing  Interventions: CBT, Motivational Interviewing, and Supportive  Summary: Boas is a 58 y.o. male with past psych history of MDD, PTSD, and GAD, presenting for follow-up therapy in efforts to improve management of presenting symptoms.  Patient actively engaged in session, presenting in primarily pleasant moods and congruent affect overall, with periods of tearfulness throughout visit.  Patient engaged in introductory check-in, sharing of Having a couple days of bad luck, further detailing of stress surrounding recent challenges with vehicle running into maintenance issues with starter, and other issues in relation to motorcycle needs. Reassessed presenting depressive and anxious sxs via PHQ-9 and GAD-7, further processing increase in challenging interactions with girlfriend in relation to trust difficutlies***      about the same as it always is I guess. Pt proved receptive to engaging in review of treatment  progress in relation to individualized goals outlined in tx plan during today's visit, engaging in reassessing of presenting depressive and anxious sxs, further exploring observed variances in scores with increase in depressive sxs. Extensively processed noted challenges surrounding sleeping too much and/or too little, revisiting implications lack of sleep has on abilities and desires to perform tasks, eat healthily, engage with others, observed increased irritability when engaging with others, and overall poor moods. Pt shared of recent challenges navigating loss of friend who passed from cancer over the past two weeks, further sharing of additional loss of family members and other individuals pt has been friends with, and the difficulties pt has with accepting losses. Actively engaged in assessing individual perspectives of progressions towards identified goals, noting of maintained, minimal progression, and identifying continued areas for work.   Patient responded well to interventions. Patient continues to meet criteria for MDD, PTSD, and GAD. Patient will continue to benefit from engagement in outpatient therapy due to being the least restrictive service to meet presenting needs.     06/05/2024    2:34 PM 05/22/2024    2:18 PM 05/07/2024    1:32 PM 04/02/2024    3:38 PM  GAD 7 : Generalized Anxiety Score  Nervous, Anxious, on Edge 1 2 2 1   Control/stop worrying 1 2 2 2   Worry too much - different things 1 1 1 2   Trouble relaxing 2 2 2 2   Restless 1 1 3 1   Easily annoyed or irritable 2 1 2 3   Afraid - awful might happen 1 1 0 1  Total GAD 7 Score 9 10 12 12   Anxiety Difficulty Very difficult Somewhat difficult Very difficult Extremely difficult      06/05/2024    2:29 PM 05/22/2024  2:13 PM 05/07/2024    1:25 PM 04/02/2024    3:43 PM 01/28/2024    9:28 AM  Depression screen PHQ 2/9  Decreased Interest 1 2 1 1 1   Down, Depressed, Hopeless 1 2 1 1 2   PHQ - 2 Score 2 4 2 2 3   Altered  sleeping 2 2 2 3 2   Tired, decreased energy 2 3 1 2 1   Change in appetite 1 1 1 1 1   Feeling bad or failure about yourself  2 2 1 1 2   Trouble concentrating 1 2 1 2 1   Moving slowly or fidgety/restless 0 0 0 1 2  Suicidal thoughts 0 0 0 0 0  PHQ-9 Score 10 14 8 12 12   Difficult doing work/chores Very difficult Somewhat difficult Somewhat difficult Extremely dIfficult Very difficult   Suicidal/Homicidal: No  Therapist Response: Clinician utilized CBT, MI and supportive reflection interventions to address presenting stressors and MH sxs.  Clinician actively greeted patient upon presenting for today's visit, assessing presenting mood and affect. *** Engaged patient in introductory check-in, encouraging pt's recounts of events of recent weeks, and factors contributing to presenting moods.  Actively listened to pt's reflections of events, providing support and empathy for pt's identified feelings surrounding recent stressors.  Actively engaged in reassessing presenting depressive and anxious sxs via PHQ-9 and GAD-7, prompting further exploration of recent variances, and utilizing psychoed and CBT to support pt in processing implications of poor sleep on moods, abilities and desires to complete tasks, and eating habits. Engaged in 52mo review of current tx plan, exploring pt's perspectives of progressions towards goals and continued areas for work.  Clinician reassessed severity of depressive and anxious sxs, and presence of any safety concerns. Therapist provided support and empathy to patient during session.  Homework: ***  Continue exploration of activities and/or hobbies of interest to increase daily activity, development of routine, implementation of self-care, and overall improvements of moods.  Plan: Return again in 2 weeks.  Review efforts towards incorporating journaling thoughts and feelings into routine, and how has proven to support in managing stress.  Diagnosis:  Encounter  Diagnoses  Name Primary?   GAD (generalized anxiety disorder) Yes   Severe episode of recurrent major depressive disorder, without psychotic features (HCC)       Collaboration of Care: Psychiatrist AEB provider notes available in EHR.  Patient/Guardian was advised Release of Information must be obtained prior to any record release in order to collaborate their care with an outside provider. Patient/Guardian was advised if they have not already done so to contact the registration department to sign all necessary forms in order for us  to release information regarding their care.   Consent: Patient/Guardian gives verbal consent for treatment and assignment of benefits for services provided during this visit. Patient/Guardian expressed understanding and agreed to proceed.   [name], MSW, LCSW 06/05/2024,  3:01 PM

## 2024-06-19 ENCOUNTER — Ambulatory Visit (INDEPENDENT_AMBULATORY_CARE_PROVIDER_SITE_OTHER): Payer: MEDICAID | Admitting: Licensed Clinical Social Worker

## 2024-06-19 DIAGNOSIS — F411 Generalized anxiety disorder: Secondary | ICD-10-CM

## 2024-06-19 DIAGNOSIS — F332 Major depressive disorder, recurrent severe without psychotic features: Secondary | ICD-10-CM

## 2024-06-19 NOTE — Progress Notes (Signed)
 THERAPIST PROGRESS NOTE   Session Date: 06/19/2024  Session Time: 1510 - 1625  Participation Level: Active  Behavioral Response: CasualAlertEuthymic  Type of Therapy: Individual Therapy  Treatment Goals addressed:  - LTG: Reduce frequency, intensity, and duration of depression symptoms so that daily functioning is improved (OP Depression) - LTG: Increase coping skills to manage depression and improve ability to perform daily activities (OP Depression) - LTG: I want to be able to talk about my past without becoming so agitated and upset (OP Depression) - STG: Report a decrease in anxiety symptoms as evidenced by an overall reduction in anxiety score by a minimum of 25% on the Generalized Anxiety Disorder Scale (GAD-7) (Anxiety) - STG: Ozell Boas will reduce frequency of avoidant behaviors by 50% as evidenced by self-report in therapy sessions (Anxiety) - LTG: To be able to function and not be so worried about everything (Anxiety)  ProgressTowards Goals: Progressing  Interventions: CBT, Motivational Interviewing, and Supportive  Summary: Boas is a 58 y.o. male with past psych history of MDD, PTSD, and GAD, presenting for follow-up therapy in efforts to improve management of presenting symptoms.  Patient actively engaged in session, presenting in primarily pleasant moods and congruent affect overall. Patient engaged in introductory check-in, sharing of doing well overall, sharing of having done something on a positive note, having booked a fishing trip with dad and son, expressing excitement surrounding plans to spend time with father and son engaging in activities they all enjoy. Pt actively engaged in expressing individual thoughts surrounding management of mental health sxs and exploring perspectives of whether challenges are ever completely resolved, or go away, sharing own personal experience as examples, processing how tx proves to be titrated with progressions and/or  improvement in management of sxs, as well as feelings surrounding life long difficulties that would benefit from continued engagement in support. Pt proved receptive to processing ways in which behavioral and adjustments to outlook and perspectives are obtained by repetitive practice, proving to supporting maintained progressions in moods and behaviors.  Patient responded well to interventions. Patient continues to meet criteria for MDD, PTSD, and GAD. Patient will continue to benefit from engagement in outpatient therapy due to being the least restrictive service to meet presenting needs.     06/05/2024    2:34 PM 05/22/2024    2:18 PM 05/07/2024    1:32 PM 04/02/2024    3:38 PM  GAD 7 : Generalized Anxiety Score  Nervous, Anxious, on Edge 1 2 2 1   Control/stop worrying 1 2 2 2   Worry too much - different things 1 1 1 2   Trouble relaxing 2 2 2 2   Restless 1 1 3 1   Easily annoyed or irritable 2 1 2 3   Afraid - awful might happen 1 1 0 1  Total GAD 7 Score 9 10 12 12   Anxiety Difficulty Very difficult Somewhat difficult Very difficult Extremely difficult      06/05/2024    2:29 PM 05/22/2024    2:13 PM 05/07/2024    1:25 PM 04/02/2024    3:43 PM 01/28/2024    9:28 AM  Depression screen PHQ 2/9  Decreased Interest 1 2 1 1 1   Down, Depressed, Hopeless 1 2 1 1 2   PHQ - 2 Score 2 4 2 2 3   Altered sleeping 2 2 2 3 2   Tired, decreased energy 2 3 1 2 1   Change in appetite 1 1 1 1 1   Feeling bad or failure about yourself  2 2  1 1 2   Trouble concentrating 1 2 1 2 1   Moving slowly or fidgety/restless 0 0 0 1 2  Suicidal thoughts 0 0 0 0 0  PHQ-9 Score 10 14 8 12 12   Difficult doing work/chores Very difficult Somewhat difficult Somewhat difficult Extremely dIfficult Very difficult   Suicidal/Homicidal: No  Therapist Response: Clinician utilized CBT, MI and supportive reflection interventions to address presenting stressors and MH sxs.  Clinician openly greeted patient upon presenting for  today's visit, assessing presenting mood and affect. Engaged pt in check-in, utilizing open ended questions in eliciting recounts of events of recent weeks, stressors, and factors contributing to presenting moods. Utilized active listening techniques to provide support to pt's reflections, validating identified thoughts and feelings, and further supporting pt in challenging irrational thoughts, supporting pt in identifying more realistic thought patterns. Utilized psychoed, CBT, MI, and supportive reflections in aiding pt in processing thoughts and perspectives surrounding presenting challenges. Clinician reassessed severity of depressive and anxious sxs, and presence of any safety concerns. Therapist provided support and empathy to patient during session. Pt proves to be maintaining minimal progression towards tx goals.  Homework: None.   Plan: Return again in 2 weeks.  Diagnosis:  Encounter Diagnoses  Name Primary?   GAD (generalized anxiety disorder) Yes   Severe episode of recurrent major depressive disorder, without psychotic features (HCC)     Collaboration of Care: Psychiatrist AEB provider notes available in EHR.  Patient/Guardian was advised Release of Information must be obtained prior to any record release in order to collaborate their care with an outside provider. Patient/Guardian was advised if they have not already done so to contact the registration department to sign all necessary forms in order for us  to release information regarding their care.   Consent: Patient/Guardian gives verbal consent for treatment and assignment of benefits for services provided during this visit. Patient/Guardian expressed understanding and agreed to proceed.   [name], MSW, LCSW 06/19/2024,  3:09 PM

## 2024-06-25 ENCOUNTER — Ambulatory Visit: Payer: MEDICAID | Admitting: Family Medicine

## 2024-06-25 ENCOUNTER — Encounter: Payer: Self-pay | Admitting: Family Medicine

## 2024-06-25 VITALS — BP 121/79 | HR 96 | Temp 98.2°F | Ht 68.0 in | Wt 200.8 lb

## 2024-06-25 DIAGNOSIS — I10 Essential (primary) hypertension: Secondary | ICD-10-CM

## 2024-06-25 DIAGNOSIS — G8929 Other chronic pain: Secondary | ICD-10-CM

## 2024-06-25 DIAGNOSIS — E669 Obesity, unspecified: Secondary | ICD-10-CM | POA: Diagnosis not present

## 2024-06-25 DIAGNOSIS — M5441 Lumbago with sciatica, right side: Secondary | ICD-10-CM

## 2024-06-25 DIAGNOSIS — E559 Vitamin D deficiency, unspecified: Secondary | ICD-10-CM

## 2024-06-25 DIAGNOSIS — Z125 Encounter for screening for malignant neoplasm of prostate: Secondary | ICD-10-CM

## 2024-06-25 DIAGNOSIS — Z0001 Encounter for general adult medical examination with abnormal findings: Secondary | ICD-10-CM | POA: Diagnosis not present

## 2024-06-25 DIAGNOSIS — Z0000 Encounter for general adult medical examination without abnormal findings: Secondary | ICD-10-CM

## 2024-06-25 DIAGNOSIS — F411 Generalized anxiety disorder: Secondary | ICD-10-CM

## 2024-06-25 DIAGNOSIS — F332 Major depressive disorder, recurrent severe without psychotic features: Secondary | ICD-10-CM

## 2024-06-25 MED ORDER — CELECOXIB 100 MG PO CAPS: 100.0000 mg | ORAL_CAPSULE | Freq: Two times a day (BID) | ORAL | 3 refills | Status: DC | PRN

## 2024-06-25 MED ORDER — TIZANIDINE HCL 2 MG PO TABS: 2.0000 mg | ORAL_TABLET | Freq: Two times a day (BID) | ORAL | 3 refills | Status: AC | PRN

## 2024-06-25 NOTE — Progress Notes (Signed)
 Jack [name] is a 58 y.o. male presents to office today for annual physical exam examination.    Discussed the use of AI scribe software for clinical note transcription with the patient, who gave verbal consent to proceed.  History of Present Illness   Jack [name] is a 58 year old male who presents for an annual physical exam.  He visited a dermatologist yesterday for spots on his cheeks and shoulders. Biopsies were performed on these spots, and lesions on his ears were frozen, resulting in tender blisters. He experienced headaches after the procedure, which he attributes to the lidocaine  and epinephrine used during the numbing process. He took Goody's powder earlier to alleviate the headache.  He is currently taking duloxetine  90 mg, prazosin  2 mg at night, and pregabalin 100 mg at bedtime. He occasionally uses ibuprofen  but is concerned about stomach issues due to his antidepressant medication.  He has a history of chronic back pain and has undergone x-rays and injections, including cortisone and medicated shots, which did not provide relief. He reports difficulty with bending and tying shoes due to back pain.  He is now on disability for this.  He sees a psychologist twice a month and mentions that his medications, including antidepressants, sometimes make him feel 'weird'. He dislikes taking multiple medications but acknowledges their necessity for managing his symptoms.  He reports regular bowel movements, with the last one occurring last night, and denies any blood in his stool. He urinates normally.     Occupation: disabled, Marital status: single, Substance use: none Health Maintenance Due  Topic Date Due   Hepatitis B Vaccines 19-59 Average Risk (1 of 3 - 19+ 3-dose series) Never done   Pneumococcal Vaccine: 50+ Years (1 of 1 - PCV) Never done   Zoster Vaccines- Shingrix (1 of 2) Never done   Influenza Vaccine  Never done   Refills needed today: all  Immunization  History  Administered Date(s) Administered   Tdap 01/01/2012, 07/24/2023   Past Medical History:  Diagnosis Date   Anxiety    Arthritis    GERD (gastroesophageal reflux disease)    Headache    occasional    History of kidney stones    Social History   Socioeconomic History   Marital status: Single    Spouse name: Not on file   Number of children: Not on file   Years of education: Not on file   Highest education level: 12th grade  Occupational History   Not on file  Tobacco Use   Smoking status: Never   Smokeless tobacco: Former    Types: Designer, multimedia Use   Vaping status: Never Used  Substance and Sexual Activity   Alcohol use: Not Currently    Alcohol/week: 0.0 standard drinks of alcohol    Comment: occasional    Drug use: No   Sexual activity: Not on file  Other Topics Concern   Not on file  Social History Narrative   Not on file   Social Drivers of Health   Financial Resource Strain: High Risk (10/26/2023)   Overall Financial Resource Strain (CARDIA)    Difficulty of Paying Living Expenses: Hard  Food Insecurity: Patient Declined (10/26/2023)   Hunger Vital Sign    Worried About Running Out of Food in the Last Year: Patient declined    Ran Out of Food in the Last Year: Patient declined  Transportation Needs: Unmet Transportation Needs (10/26/2023)   PRAPARE - Transportation  Lack of Transportation (Medical): No    Lack of Transportation (Non-Medical): Yes  Physical Activity: Unknown (10/26/2023)   Exercise Vital Sign    Days of Exercise per Week: Patient declined    Minutes of Exercise per Session: Not on file  Stress: Stress Concern Present (10/26/2023)   [employer] of Occupational Health - Occupational Stress Questionnaire    Feeling of Stress : Very much  Social Connections: Unknown (10/26/2023)   Social Connection and Isolation Panel    Frequency of Communication with Friends and Family: Patient declined    Frequency of Social Gatherings with  Friends and Family: Patient declined    Attends Religious Services: Patient declined    Active Member of Clubs or Organizations: No    Attends Engineer, structural: Not on file    Marital Status: Divorced  Intimate Partner Violence: Not on file   Past Surgical History:  Procedure Laterality Date   BIOPSY  04/23/2023   Procedure: BIOPSY;  Surgeon: Cindie Carlin POUR, DO;  Location: [facility];  Service: Endoscopy;;   COLONOSCOPY WITH PROPOFOL  N/A 04/23/2023   Procedure: COLONOSCOPY WITH PROPOFOL ;  Surgeon: Cindie Carlin POUR, DO;  Location: [facility];  Service: Endoscopy;  Laterality: N/A;  9:00 AM, ASA 3   EXTRACORPOREAL SHOCK WAVE LITHOTRIPSY Right 05/30/2018   Procedure: RIGHT EXTRACORPOREAL SHOCK WAVE LITHOTRIPSY (ESWL);  Surgeon: Cam Morene ORN, MD;  Location: [facility];  Service: Urology;  Laterality: Right;   KNEE ARTHROSCOPY Right    KNEE SURGERY Left    3 arthroscopic,  two ACL reconstuctions   SHOULDER SURGERY Right    dislocation repair with a pin   TONSILLECTOMY AND ADENOIDECTOMY     TOTAL KNEE ARTHROPLASTY Left 07/12/2015   Procedure: TOTAL LEFT  KNEE ARTHROPLASTY;  Surgeon: Dempsey Moan, MD;  Location: [facility];  Service: Orthopedics;  Laterality: Left;   tubes in ears     Family History  Problem Relation Age of Onset   Arthritis Mother     Current Outpatient Medications:    celecoxib  (CELEBREX ) 100 MG capsule, Take 1 capsule (100 mg total) by mouth 2 (two) times daily as needed for moderate pain (pain score 4-6)., Disp: 60 capsule, Rfl: 3   DULoxetine  (CYMBALTA ) 30 MG capsule, Take 3 capsules (90 mg total) by mouth daily. **new dose, Disp: 270 capsule, Rfl: 3   prazosin  (MINIPRESS ) 1 MG capsule, Take 2 capsules (2 mg total) by mouth at bedtime., Disp: 60 capsule, Rfl: 0   pregabalin (LYRICA) 100 MG capsule, Take 100 mg by mouth at bedtime., Disp: , Rfl:    tiZANidine  (ZANAFLEX ) 2 MG tablet, Take 1 tablet (2 mg total) by mouth 2 (two) times daily as needed  for muscle spasms., Disp: 180 tablet, Rfl: 3  Allergies  Allergen Reactions   Tramadol  Nausea And Vomiting     ROS: Review of Systems Pertinent items noted in HPI and remainder of comprehensive ROS otherwise negative.    Physical exam BP 121/79   Pulse 96   Temp 98.2 F (36.8 C)   Ht 5' 8 (1.727 m)   Wt 200 lb 12.8 oz (91.1 kg)   SpO2 97%   BMI 30.53 kg/m  General appearance: alert, cooperative, appears stated age, and no distress Head: Normocephalic, without obvious abnormality, atraumatic Eyes: negative findings: lids and lashes normal, conjunctivae and sclerae normal, corneas clear, and pupils equal, round, reactive to light and accomodation Ears: normal TM's and external ear canals both ears Nose: Nares normal. Septum  midline. Mucosa normal. No drainage or sinus tenderness. Throat: lips, mucosa, and tongue normal; teeth and gums normal Neck: no adenopathy, no carotid bruit, supple, symmetrical, trachea midline, and thyroid  not enlarged, symmetric, no tenderness/mass/nodules Back: No midline tenderness palpation.  He is ambulating independently but does have apparent antalgic response when he gets up from a seated position Lungs: clear to auscultation bilaterally Chest wall: no tenderness Heart: regular rate and rhythm, S1, S2 normal, no murmur, click, rub or gallop Abdomen: Soft, no palpable masses or hepatosplenomegaly bowel sounds are present Extremities: extremities normal, atraumatic, no cyanosis or edema; he has postop changes in the left knee Pulses: 2+ and symmetric Skin: Has a few postop sites that are covered with bandages along the left cheek and shoulders Lymph nodes: Cervical, supraclavicular, and axillary nodes normal. Neurologic: Grossly normal      06/25/2024    2:43 PM 06/05/2024    2:29 PM 05/22/2024    2:13 PM  Depression screen PHQ 2/9  Decreased Interest 1    Down, Depressed, Hopeless 2    PHQ - 2 Score 3    Altered sleeping 2    Tired, decreased  energy 1    Change in appetite 1    Feeling bad or failure about yourself  2    Trouble concentrating 1    Moving slowly or fidgety/restless 1    Suicidal thoughts 0    PHQ-9 Score 11    Difficult doing work/chores Very difficult       Information is confidential and restricted. Go to Review Flowsheets to unlock data.      06/25/2024    2:43 PM 06/05/2024    2:34 PM 05/22/2024    2:18 PM 05/07/2024    1:32 PM  GAD 7 : Generalized Anxiety Score  Nervous, Anxious, on Edge 2     Control/stop worrying 1     Worry too much - different things 1     Trouble relaxing 2     Restless 1     Easily annoyed or irritable 2     Afraid - awful might happen 0     Total GAD 7 Score 9     Anxiety Difficulty Very difficult        Information is confidential and restricted. Go to Review Flowsheets to unlock data.     Assessment/ Plan: Jack [name] here for annual physical exam.   Annual physical exam  Essential hypertension - Plan: CMP14+EGFR  Vitamin D  deficiency - Plan: CMP14+EGFR, VITAMIN D  25 Hydroxy (Vit-D Deficiency, Fractures)  Obesity (BMI 30-39.9) - Plan: CMP14+EGFR, Lipid Panel, CBC with Differential, TSH  Screening for malignant neoplasm of prostate - Plan: CMP14+EGFR, PSA  GAD (generalized anxiety disorder) - Plan: CMP14+EGFR, CBC with Differential  Severe episode of recurrent major depressive disorder, without psychotic features (HCC) - Plan: CMP14+EGFR, CBC with Differential  Chronic right-sided low back pain with right-sided sciatica - Plan: celecoxib  (CELEBREX ) 100 MG capsule, tiZANidine  (ZANAFLEX ) 2 MG tablet  Assessment and Plan    Chronic low back pain with radiculopathy Previous treatments ineffective. Concerns about lumbar fusion surgery due to family history.  - Discuss with orthopedic specialist about additional injections. - Prescribe celecoxib  to replace ibuprofen . - Continue pregabalin 100 mg at bedtime. - Zanaflex  renewed  Major depressive disorder and  generalized anxiety disorder Managed with duloxetine  and prazosin . Reports some side effects but continues medication. Receives regular psychological support. - Continue duloxetine  90 mg and prazosin  2 mg at night. -  Continue psychological support.  HTN Controlled with diet.  Adult Wellness Visit Annual physical examination conducted. Blood work needed for comprehensive health assessment. - Order blood work for prostate level, anemia, kidney function, liver function, blood sugar, and cholesterol levels, Vit D. - Declines all vaccines      Counseled on healthy lifestyle choices, including diet (rich in fruits, vegetables and lean meats and low in salt and simple carbohydrates) and exercise (at least 30 minutes of moderate physical activity daily).  Patient to follow up 3-75m  Jack Juhnke M. Jolinda, DO

## 2024-06-26 LAB — CMP14+EGFR
ALT: 24 IU/L (ref 0–44)
AST: 27 IU/L (ref 0–40)
Albumin: 4.5 g/dL (ref 3.8–4.9)
Alkaline Phosphatase: 121 IU/L (ref 47–123)
BUN/Creatinine Ratio: 9 (ref 9–20)
BUN: 9 mg/dL (ref 6–24)
Bilirubin Total: 0.3 mg/dL (ref 0.0–1.2)
CO2: 23 mmol/L (ref 20–29)
Calcium: 9.2 mg/dL (ref 8.7–10.2)
Chloride: 103 mmol/L (ref 96–106)
Creatinine, Ser: 1.01 mg/dL (ref 0.76–1.27)
Globulin, Total: 2.3 g/dL (ref 1.5–4.5)
Glucose: 94 mg/dL (ref 70–99)
Potassium: 4.3 mmol/L (ref 3.5–5.2)
Sodium: 142 mmol/L (ref 134–144)
Total Protein: 6.8 g/dL (ref 6.0–8.5)
eGFR: 87 mL/min/1.73 (ref >59)

## 2024-06-26 LAB — CBC WITH DIFFERENTIAL/PLATELET
Basophils Absolute: 0.1 x10E3/uL (ref 0.0–0.2)
Basos: 1 %
EOS (ABSOLUTE): 0.2 x10E3/uL (ref 0.0–0.4)
Eos: 2 %
Hematocrit: 45.8 % (ref 37.5–51.0)
Hemoglobin: 15.2 g/dL (ref 13.0–17.7)
Immature Grans (Abs): 0.1 x10E3/uL (ref 0.0–0.1)
Immature Granulocytes: 1 %
Lymphocytes Absolute: 1.6 x10E3/uL (ref 0.7–3.1)
Lymphs: 14 %
MCH: 30.8 pg (ref 26.6–33.0)
MCHC: 33.2 g/dL (ref 31.5–35.7)
MCV: 93 fL (ref 79–97)
Monocytes Absolute: 0.8 x10E3/uL (ref 0.1–0.9)
Monocytes: 7 %
Neutrophils Absolute: 9.1 x10E3/uL — ABNORMAL HIGH (ref 1.4–7.0)
Neutrophils: 75 %
Platelets: 329 x10E3/uL (ref 150–450)
RBC: 4.93 x10E6/uL (ref 4.14–5.80)
RDW: 13.4 % (ref 11.6–15.4)
WBC: 11.8 x10E3/uL — ABNORMAL HIGH (ref 3.4–10.8)

## 2024-06-26 LAB — LIPID PANEL
Chol/HDL Ratio: 4.1 ratio (ref 0.0–5.0)
Cholesterol, Total: 199 mg/dL (ref 100–199)
HDL: 49 mg/dL (ref >39)
LDL Chol Calc (NIH): 114 mg/dL — ABNORMAL HIGH (ref 0–99)
Triglycerides: 205 mg/dL — ABNORMAL HIGH (ref 0–149)
VLDL Cholesterol Cal: 36 mg/dL (ref 5–40)

## 2024-06-26 LAB — VITAMIN D 25 HYDROXY (VIT D DEFICIENCY, FRACTURES): Vit D, 25-Hydroxy: 23.1 ng/mL — ABNORMAL LOW (ref 30.0–100.0)

## 2024-06-26 LAB — PSA: Prostate Specific Ag, Serum: 1.3 ng/mL (ref 0.0–4.0)

## 2024-06-26 LAB — TSH: TSH: 1.05 u[IU]/mL (ref 0.450–4.500)

## 2024-06-27 ENCOUNTER — Ambulatory Visit: Payer: Self-pay | Admitting: Family Medicine

## 2024-06-27 DIAGNOSIS — E559 Vitamin D deficiency, unspecified: Secondary | ICD-10-CM

## 2024-06-27 MED ORDER — VITAMIN D (ERGOCALCIFEROL) 1.25 MG (50000 UNIT) PO CAPS: 50000.0000 [IU] | ORAL_CAPSULE | ORAL | 3 refills | Status: AC

## 2024-07-03 ENCOUNTER — Ambulatory Visit (HOSPITAL_COMMUNITY): Payer: MEDICAID | Admitting: Licensed Clinical Social Worker

## 2024-07-03 DIAGNOSIS — F332 Major depressive disorder, recurrent severe without psychotic features: Secondary | ICD-10-CM

## 2024-07-03 DIAGNOSIS — F411 Generalized anxiety disorder: Secondary | ICD-10-CM | POA: Diagnosis not present

## 2024-07-03 NOTE — Progress Notes (Unsigned)
 THERAPIST PROGRESS NOTE   Session Date: 07/03/2024  Session Time: 1515 - 1608  Participation Level: Active  Behavioral Response: CasualAlertEuthymic  Type of Therapy: Individual Therapy  Treatment Goals addressed:  - LTG: Reduce frequency, intensity, and duration of depression symptoms so that daily functioning is improved (OP Depression) - LTG: Increase coping skills to manage depression and improve ability to perform daily activities (OP Depression) - LTG: I want to be able to talk about my past without becoming so agitated and upset (OP Depression) - STG: Report a decrease in anxiety symptoms as evidenced by an overall reduction in anxiety score by a minimum of 25% on the Generalized Anxiety Disorder Scale (GAD-7) (Anxiety) - STG: Jack [name] will reduce frequency of avoidant behaviors by 50% as evidenced by self-report in therapy sessions (Anxiety) - LTG: To be able to function and not be so worried about everything (Anxiety)  ProgressTowards Goals: Progressing  Interventions: CBT, Motivational Interviewing, and Supportive  Summary: [name] is a 58 y.o. male with past psych history of MDD, PTSD, and GAD, presenting for follow-up therapy in efforts to improve management of presenting symptoms.  Patient actively engaged in session, presenting in primarily pleasant moods and congruent affect overall. Patient engaged in introductory check-in, sharing of It's been a hell of a week this week, further detailing of ***      doing well overall, sharing of having done something on a positive note, having booked a fishing trip with dad and son, expressing excitement surrounding plans to spend time with father and son engaging in activities they all enjoy. Pt actively engaged in expressing individual thoughts surrounding management of mental health sxs and exploring perspectives of whether challenges are ever completely resolved, or go away, sharing own personal experience as  examples, processing how tx proves to be titrated with progressions and/or improvement in management of sxs, as well as feelings surrounding life long difficulties that would benefit from continued engagement in support. Pt proved receptive to processing ways in which behavioral and adjustments to outlook and perspectives are obtained by repetitive practice, proving to supporting maintained progressions in moods and behaviors.  Patient responded well to interventions. Patient continues to meet criteria for MDD, PTSD, and GAD. Patient will continue to benefit from engagement in outpatient therapy due to being the least restrictive service to meet presenting needs.     06/25/2024    2:43 PM 06/05/2024    2:34 PM 05/22/2024    2:18 PM 05/07/2024    1:32 PM  GAD 7 : Generalized Anxiety Score  Nervous, Anxious, on Edge 2 1 2 2   Control/stop worrying 1 1 2 2   Worry too much - different things 1 1 1 1   Trouble relaxing 2 2 2 2   Restless 1 1 1 3   Easily annoyed or irritable 2 2 1 2   Afraid - awful might happen 0 1 1 0  Total GAD 7 Score 9 9 10 12   Anxiety Difficulty Very difficult Very difficult Somewhat difficult Very difficult      06/25/2024    2:43 PM 06/05/2024    2:29 PM 05/22/2024    2:13 PM 05/07/2024    1:25 PM 04/02/2024    3:43 PM  Depression screen PHQ 2/9  Decreased Interest 1 1 2 1 1   Down, Depressed, Hopeless 2 1 2 1 1   PHQ - 2 Score 3 2 4 2 2   Altered sleeping 2 2 2 2 3   Tired, decreased energy 1 2 3 1  2  Change in appetite 1 1 1 1 1   Feeling bad or failure about yourself  2 2 2 1 1   Trouble concentrating 1 1 2 1 2   Moving slowly or fidgety/restless 1 0 0 0 1  Suicidal thoughts 0 0 0 0 0  PHQ-9 Score 11 10 14 8 12   Difficult doing work/chores Very difficult Very difficult Somewhat difficult Somewhat difficult Extremely dIfficult   Suicidal/Homicidal: No  Therapist Response: Clinician utilized CBT, MI and supportive reflection interventions to address presenting stressors  and MH sxs.  Clinician openly greeted patient upon presenting for today's visit, *** assessing presenting mood and affect. Engaged pt in check-in, utilizing open ended questions in eliciting recounts of events of recent weeks, stressors, and factors contributing to presenting moods. Utilized active listening techniques to provide support to pt's reflections, validating identified thoughts and feelings, and further supporting pt in challenging irrational thoughts, supporting pt in identifying more realistic thought patterns. Utilized psychoed, CBT, MI, and supportive reflections in aiding pt in processing thoughts and perspectives surrounding presenting challenges. Clinician reassessed severity of depressive and anxious sxs, and presence of any safety concerns. Therapist provided support and empathy to patient during session. Pt proves to be maintaining minimal progression towards tx goals.  []  Cognitive Challenging     []  Cognitive Refocusing     []  Cognitive Reframing     []  Communication Skills []  Compliance Issues     []  DBT     []  Exploration of Coping Patterns     []  Exploration of Emotions []  Exploration of Relationship Patterns     []  Guided Imagery     []  Interactive Feedback     []  Interpersonal Resolutions []  Mindfulness Training     []  Preventative Services     []  Psycho-Education     []  Relaxation/Deep Breathing []  Review of Treatment Plan/Progress     []  Role-Play/Behavioral Rehearsal     []  Structured Problem Solving     []  Supportive Reflection []  Symptom Management     []  Other ***  Homework: None.   Plan: Return again in 2 weeks.  Diagnosis:  Encounter Diagnoses  Name Primary?   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  Patient/Guardian was advised Release of Information must be obtained prior to any record  release in order to collaborate their care with an outside provider. Patient/Guardian was advised if they have not already done so to contact the registration department to sign all necessary forms in order for us  to release information regarding their care.   Consent: Patient/Guardian gives verbal consent for treatment and assignment of benefits for services provided during this visit. Patient/Guardian expressed understanding and agreed to proceed.   Jack [name], MSW, LCSW 07/03/2024,  4:08 PM

## 2024-07-16 ENCOUNTER — Ambulatory Visit (HOSPITAL_COMMUNITY): Payer: MEDICAID | Admitting: Licensed Clinical Social Worker

## 2024-07-16 NOTE — Progress Notes (Signed)
 THERAPIST PROGRESS NOTE   Session Date: 07/16/2024  Session Time: 1400  Patient no-showed today's appointment; appointment was for follow up therapy. Pt did not provider prior notification of needing to cancel/reschedule today's appt.    [name], MSW, LCSW 07/16/2024,  2:25 PM

## 2024-07-31 ENCOUNTER — Ambulatory Visit (HOSPITAL_COMMUNITY): Payer: MEDICAID | Admitting: Licensed Clinical Social Worker

## 2024-07-31 DIAGNOSIS — F411 Generalized anxiety disorder: Secondary | ICD-10-CM | POA: Diagnosis not present

## 2024-07-31 DIAGNOSIS — F332 Major depressive disorder, recurrent severe without psychotic features: Secondary | ICD-10-CM

## 2024-07-31 NOTE — Progress Notes (Unsigned)
 THERAPIST PROGRESS NOTE   Session Date: 07/31/2024  Session Time: 1312 - 1410  Participation Level: Active  Behavioral Response: CasualAlertAnxious, Depressed, and tearful  Type of Therapy: Individual Therapy  Treatment Goals addressed:  - LTG: Reduce frequency, intensity, and duration of depression symptoms so that daily functioning is improved (OP Depression) - LTG: Increase coping skills to manage depression and improve ability to perform daily activities (OP Depression) - LTG: I want to be able to talk about my past without becoming so agitated and upset (OP Depression) - STG: Report a decrease in anxiety symptoms as evidenced by an overall reduction in anxiety score by a minimum of 25% on the Generalized Anxiety Disorder Scale (GAD-7) (Anxiety) - STG: Jack [name] will reduce frequency of avoidant behaviors by 50% as evidenced by self-report in therapy sessions (Anxiety) - LTG: To be able to function and not be so worried about everything (Anxiety)  ProgressTowards Goals: Progressing  Interventions: CBT, Motivational Interviewing, and Supportive  Summary: [name] is a 58 y.o. male with past psych history of MDD, PTSD, and GAD, presenting for follow-up therapy in efforts to improve management of presenting symptoms.  Patient actively engaged in session, presenting in variable moods, primarily pleasant with periods of depressive and anxiousness, and congruent affect overall. Patient engaged in introductory check-in, sharing of I don't feel too bad I reckon, further detailing of having gone on a day trip to [facility] this past weekend in [place], [place]. Pt further detailed recent events, sharing of having been helping father renovate family cabin on parents land. Pt shared of recent familial stress surrounding challenges other family members prove to be navigating and unfortunate outcomes of such. Pt shared of experiencing challenges navigating feelings and emotions, finding  self becoming tearful regularly, proving unaware of the triggering events.      06/25/2024    2:43 PM 06/05/2024    2:34 PM 05/22/2024    2:18 PM 05/07/2024    1:32 PM  GAD 7 : Generalized Anxiety Score  Nervous, Anxious, on Edge 2 1 2 2   Control/stop worrying 1 1 2 2   Worry too much - different things 1 1 1 1   Trouble relaxing 2 2 2 2   Restless 1 1 1 3   Easily annoyed or irritable 2 2 1 2   Afraid - awful might happen 0 1 1 0  Total GAD 7 Score 9 9 10 12   Anxiety Difficulty Very difficult Very difficult Somewhat difficult Very difficult      06/25/2024    2:43 PM 06/05/2024    2:29 PM 05/22/2024    2:13 PM 05/07/2024    1:25 PM 04/02/2024    3:43 PM  Depression screen PHQ 2/9  Decreased Interest 1 1 2 1 1   Down, Depressed, Hopeless 2 1 2 1 1   PHQ - 2 Score 3 2 4 2 2   Altered sleeping 2 2 2 2 3   Tired, decreased energy 1 2 3 1 2   Change in appetite 1 1 1 1 1   Feeling bad or failure about yourself  2 2 2 1 1   Trouble concentrating 1 1 2 1 2   Moving slowly or fidgety/restless 1 0 0 0 1  Suicidal thoughts 0 0 0 0 0  PHQ-9 Score 11 10 14 8 12   Difficult doing work/chores Very difficult Very difficult Somewhat difficult Somewhat difficult Extremely dIfficult   Suicidal/Homicidal: No  Therapist Response:  Clinician openly greeted patient upon presenting for today's visit, assessing presenting mood and affect, actively  engaging pt in check-in, exploring daily events and presenting moods. Utilized open ended questions in eliciting recounts of recent events, newly identified stressors, continued stressors, impact stressors have on moods, and individual efforts at navigating challenges.  Utilized active listening techniques to provide support to pt in exploration of recent events, providing support and validation of feelings, and further aiding in processing experienced sxs in relation to stressors and individual efforts at managing them.  Utilized psychoed, CBT, MI, and supportive  reflections in aiding pt in processing thoughts and perspectives surrounding presenting challenges.  Clinician reassessed severity of depressive and anxious sxs, and presence of any safety concerns.   []  Cognitive Challenging [x]  Cognitive Refocusing [x]  Cognitive Reframing  []  Communication Skills []  Compliance Issues []  DBT  []  Exploration of Coping Patterns [x]  Exploration of Emotions [x]  Exploration of Relationship Patterns  []  Guided Imagery []  Interactive Feedback []  Interpersonal Resolutions []  Mindfulness Training     []  Preventative Services     [x]  Psycho-Education     []  Relaxation/Deep Breathing []  Review of Treatment Plan/Progress     []  Role-Play/Behavioral Rehearsal     [x]  Structured Problem Solving     [x]  Supportive Reflection [x]  Symptom Management     []  Other  Patient responded well to interventions. Patient continues to meet criteria for MDD, PTSD, and GAD. Patient will continue to benefit from engagement in outpatient therapy due to being the least restrictive service to meet presenting needs. Pt proves to be maintaining minimal progression towards tx goals.  Homework: None.   Plan: Return again in 2 weeks.  Diagnosis:  Encounter Diagnoses  Name Primary?   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  Patient/Guardian was advised Release of Information must be obtained prior to any record release in order to collaborate their care with an outside provider. Patient/Guardian was advised if they have not already done so to contact the registration department to sign all necessary forms in order for us  to release information regarding their care.   Consent: Patient/Guardian gives verbal consent for treatment and assignment of benefits for services provided during this visit. Patient/Guardian expressed  understanding and agreed to proceed.   Jack [name], MSW, LCSW 07/31/2024,  1:15 PM

## 2024-08-14 ENCOUNTER — Ambulatory Visit (HOSPITAL_COMMUNITY): Payer: MEDICAID | Admitting: Licensed Clinical Social Worker

## 2024-08-14 DIAGNOSIS — F332 Major depressive disorder, recurrent severe without psychotic features: Secondary | ICD-10-CM

## 2024-08-14 DIAGNOSIS — F411 Generalized anxiety disorder: Secondary | ICD-10-CM

## 2024-08-14 NOTE — Progress Notes (Unsigned)
 THERAPIST PROGRESS NOTE   Session Date: 08/14/2024  Session Time: 1505 - 1609  Participation Level: Active  Behavioral Response: CasualAlertAnxious and Depressed  Type of Therapy: Individual Therapy  Treatment Goals addressed:  - LTG: Reduce frequency, intensity, and duration of depression symptoms so that daily functioning is improved (OP Depression) - LTG: Increase coping skills to manage depression and improve ability to perform daily activities (OP Depression) - LTG: I want to be able to talk about my past without becoming so agitated and upset (OP Depression) - STG: Report a decrease in anxiety symptoms as evidenced by an overall reduction in anxiety score by a minimum of 25% on the Generalized Anxiety Disorder Scale (GAD-7) (Anxiety) - STG: Jack [name] will reduce frequency of avoidant behaviors by 50% as evidenced by self-report in therapy sessions (Anxiety) - LTG: To be able to function and not be so worried about everything (Anxiety)  ProgressTowards Goals: Progressing  Interventions: CBT, Motivational Interviewing, and Supportive  Summary: [name] is a 58 y.o. male with past psych history of MDD, PTSD, and GAD, presenting for follow-up therapy in efforts to improve management of presenting symptoms.  Patient actively engaged in session, presenting in variable moods, primarily pleasant with periods of depressive and anxiousness, and congruent affect overall. Patient engaged in introductory check-in, sharing of feeling tired today, having slept only approx. 3-4 last night while caring for aunt. Pt further engaged in providing recounts of recent weeks, detailing fishing trip last weekend with father and oldest son in [place], sharing to have found time spent enjoyable. Pt shared of additional stress surrounding recent removal of lesion on Tuesday, being second location, learning of first lesion removed two weeks ago to be non-cancerous, finding self to be experiencing more pain with  recent removal leading self to experience increased anxious thoughts, sharing of efforts to reassure self and remain positive. Pt actively engaged in exploration of individual experiences and interests in music and newly found songs that pt finds to resinate with own individual experiences, and further processing how music serves as an avenue of expressing emotion.     06/25/2024    2:43 PM 06/05/2024    2:34 PM 05/22/2024    2:18 PM 05/07/2024    1:32 PM  GAD 7 : Generalized Anxiety Score  Nervous, Anxious, on Edge 2 1 2 2   Control/stop worrying 1 1 2 2   Worry too much - different things 1 1 1 1   Trouble relaxing 2 2 2 2   Restless 1 1 1 3   Easily annoyed or irritable 2 2 1 2   Afraid - awful might happen 0 1 1 0  Total GAD 7 Score 9 9 10 12   Anxiety Difficulty Very difficult Very difficult Somewhat difficult Very difficult      06/25/2024    2:43 PM 06/05/2024    2:29 PM 05/22/2024    2:13 PM 05/07/2024    1:25 PM 04/02/2024    3:43 PM  Depression screen PHQ 2/9  Decreased Interest 1 1 2 1 1   Down, Depressed, Hopeless 2 1 2 1 1   PHQ - 2 Score 3 2 4 2 2   Altered sleeping 2 2 2 2 3   Tired, decreased energy 1 2 3 1 2   Change in appetite 1 1 1 1 1   Feeling bad or failure about yourself  2 2 2 1 1   Trouble concentrating 1 1 2 1 2   Moving slowly or fidgety/restless 1 0 0 0 1  Suicidal thoughts 0 0 0  0 0  PHQ-9 Score 11  10  14  8  12    Difficult doing work/chores Very difficult Very difficult Somewhat difficult Somewhat difficult Extremely dIfficult     Data saved with a previous flowsheet row definition   Suicidal/Homicidal: No  Therapist Response:  Clinician openly greeted patient upon presenting for today's visit, assessing presenting mood and affect, actively engaging in check-in, exploring daily events and presenting moods. Utilized open ended questions in eliciting recounts of events of recent weeks, newly observed stressors, ongoing stressors, implications of stressors on moods,  and individual efforts at navigating challenges.  Utilized active listening techniques to provide support to pt in exploration of events and challenges, further supporting in processing sxs in relation to stressors and individual efforts at managing them.  Utilized psychoed, CBT, MI, and supportive reflections in aiding pt in processing thoughts and perspectives surrounding presenting challenges.  Clinician reassessed severity of depressive and anxious sxs, and presence of any safety concerns.   [x]  Cognitive Challenging [x]  Cognitive Refocusing [x]  Cognitive Reframing  []  Communication Skills []  Compliance Issues []  DBT  [x]  Exploration of Coping Patterns [x]  Exploration of Emotions [x]  Exploration of Relationship Patterns  []  Guided Imagery []  Interactive Feedback []  Interpersonal Resolutions []  Mindfulness Training []  Preventative Services [x]  Psycho-Education []  Relaxation/Deep Breathing []  Review of Treatment Plan/Progress []  Role-Play/Behavioral Rehearsal [x]  Structured Problem Solving [x]  Supportive Reflection [x]  Symptom Management  []  Other  Patient responded well to interventions. Patient continues to meet criteria for MDD, PTSD, and GAD. Patient will continue to benefit from engagement in outpatient therapy due to being the least restrictive service to meet presenting needs. Pt proves to be maintaining progression towards tx goals.  Homework: None.   Plan: Return again in 2 weeks.  Diagnosis:  Encounter Diagnoses  Name Primary?   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  Patient/Guardian was advised Release of Information must be obtained prior to any record release in order to collaborate their care with an outside provider. Patient/Guardian was advised if they have not already done so to contact the  registration department to sign all necessary forms in order for us  to release information regarding their care.   Consent: Patient/Guardian gives verbal consent for treatment and assignment of benefits for services provided during this visit. Patient/Guardian expressed understanding and agreed to proceed.   [name], MSW, LCSW 08/14/2024,  3:08 PM

## 2024-08-28 ENCOUNTER — Ambulatory Visit (HOSPITAL_COMMUNITY): Payer: MEDICAID | Admitting: Licensed Clinical Social Worker

## 2024-08-28 DIAGNOSIS — F332 Major depressive disorder, recurrent severe without psychotic features: Secondary | ICD-10-CM

## 2024-08-28 DIAGNOSIS — F411 Generalized anxiety disorder: Secondary | ICD-10-CM

## 2024-08-28 NOTE — Progress Notes (Signed)
 THERAPIST PROGRESS NOTE   Session Date: 08/28/2024  Session Time: 1410 - 1510  Participation Level: Active  Behavioral Response: CasualAlertAnxious and Depressed  Type of Therapy: Individual Therapy  Treatment Goals addressed:  - LTG: Reduce frequency, intensity, and duration of depression symptoms so that daily functioning is improved (OP Depression) - LTG: Increase coping skills to manage depression and improve ability to perform daily activities (OP Depression) - LTG: I want to be able to talk about my past without becoming so agitated and upset (OP Depression) - STG: Report a decrease in anxiety symptoms as evidenced by an overall reduction in anxiety score by a minimum of 25% on the Generalized Anxiety Disorder Scale (GAD-7) (Anxiety) - STG: Jack [name] will reduce frequency of avoidant behaviors by 50% as evidenced by self-report in therapy sessions (Anxiety) - LTG: To be able to function and not be so worried about everything (Anxiety)  ProgressTowards Goals: Progressing  Interventions: CBT, Motivational Interviewing, and Supportive  Summary: [name] is a 58 y.o. male with past psych history of MDD, PTSD, and GAD, presenting for follow-up therapy in efforts to improve management of presenting symptoms.  Patient actively engaged in session, presenting in variable moods, primarily pleasant with periods of anxiousness and sadness, and congruent affect overall. Patient engaged in introductory check-in, sharing of things maintaining, being interrupted by cell phone ringing and noting it to be spam calls, further sharing of increased frustrations of spam calls and their efforts towards conning individuals out of their life savings, sharing of the frustrations experienced in relation to efforts spammers will go to in obtaining information.  Jack [name] additionally detailed recent stress surrounding interactions with youngest son, sharing of having received less frequent contact with  him over the recent time for which son has been dating an older woman whom Jack [name] is familiar with and believes to be troublesome for son, causing added anxiousness, sharing of recent interactions with son and feeling taken advantage of when giving son money whom subsequently left without finishing clearing leaves at Jack [name] home. Further explored potential contributing factors and erratic behaviors son and girlfriend prove to engage in causing concern for Jack [name].  Began exploration of challenges experienced with relationships with youngest son and ex-wife, reflecting on stressors related to ex-wife's treatment towards Jack [name], wrong doing towards Jack [name] parents and adultery while Jack [name] was incarcerated, and her involvement in circumstances leading to Jack [name] incarceration. Explored Jack [name] challenges with reflecting on hx surrounding legal troubles and time incarcerated, noting of negative feelings and challenges navigating hx.     06/25/2024    2:43 PM 06/05/2024    2:34 PM 05/22/2024    2:18 PM 05/07/2024    1:32 PM  GAD 7 : Generalized Anxiety Score  Nervous, Anxious, on Edge 2 1 2 2   Control/stop worrying 1 1 2 2   Worry too much - different things 1 1 1 1   Trouble relaxing 2 2 2 2   Restless 1 1 1 3   Easily annoyed or irritable 2 2 1 2   Afraid - awful might happen 0 1 1 0  Total GAD 7 Score 9 9 10 12   Anxiety Difficulty Very difficult Very difficult Somewhat difficult Very difficult      06/25/2024    2:43 PM 06/05/2024    2:29 PM 05/22/2024    2:13 PM 05/07/2024    1:25 PM 04/02/2024    3:43 PM  Depression screen PHQ 2/9  Decreased Interest 1 1 2 1 1   Down, Depressed, Hopeless 2 1 2  1  1  PHQ - 2 Score 3 2 4 2 2   Altered sleeping 2 2 2 2 3   Tired, decreased energy 1 2 3 1 2   Change in appetite 1 1 1 1 1   Feeling bad or failure about yourself  2 2 2 1 1   Trouble concentrating 1 1 2 1 2   Moving slowly or fidgety/restless 1 0 0 0 1  Suicidal thoughts 0 0 0 0 0  PHQ-9 Score 11  10  14  8  12    Difficult doing  work/chores Very difficult Very difficult Somewhat difficult Somewhat difficult Extremely dIfficult     Data saved with a previous flowsheet row definition   Suicidal/Homicidal: No  Therapist Response:  Clinician openly greeted patient upon presenting for today's visit, assessing presenting mood and affect, actively engaging in check-in, exploring daily events and presenting moods. Utilized open ended questions in eliciting recounts of events of recent weeks, exploring newly presenting/identified stressors, observed implications of stressors on moods, and individual efforts at navigating challenges.  Utilized active listening techniques to provide support to Jack [name] in exploration of recent events and challenges, validating rational thoughts, feelings, and perspectives, and further supporting in processing frustrations and processing responses to such.  Utilized psychoed, CBT, MI, and supportive reflections in aiding Jack [name] in processing thoughts and perspectives surrounding presenting challenges.  Clinician reassessed severity of depressive and anxious sxs, and presence of any safety concerns.   [x]  Cognitive Challenging [x]  Cognitive Refocusing [x]  Cognitive Reframing  []  Communication Skills []  Compliance Issues []  DBT  [x]  Exploration of Coping Patterns [x]  Exploration of Emotions [x]  Exploration of Relationship Patterns  []  Guided Imagery []  Interactive Feedback []  Interpersonal Resolutions []  Mindfulness Training []  Preventative Services [x]  Psycho-Education []  Relaxation/Deep Breathing []  Review of Treatment Plan/Progress []  Role-Play/Behavioral Rehearsal [x]  Structured Problem Solving [x]  Supportive Reflection [x]  Symptom Management  []  Other  Patient responded well to interventions. Patient continues to meet criteria for MDD, PTSD, and GAD. Patient will continue to benefit from engagement in outpatient therapy due to being the least restrictive service to meet presenting needs. Jack [name] proves to be  maintaining progression towards tx goals.  Homework: None.   Plan: Return again in 2 weeks.  Diagnosis:  Encounter Diagnoses  Name Primary?   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  Patient/Guardian was advised Release of Information must be obtained prior to any record release in order to collaborate their care with an outside provider. Patient/Guardian was advised if they have not already done so to contact the registration department to sign all necessary forms in order for us  to release information regarding their care.   Consent: Patient/Guardian gives verbal consent for treatment and assignment of benefits for services provided during this visit. Patient/Guardian expressed understanding and agreed to proceed.   [name], MSW, LCSW 08/28/2024,  2:13 PM

## 2024-09-04 ENCOUNTER — Encounter (HOSPITAL_COMMUNITY): Payer: Self-pay

## 2024-09-18 ENCOUNTER — Ambulatory Visit (INDEPENDENT_AMBULATORY_CARE_PROVIDER_SITE_OTHER): Payer: MEDICAID | Admitting: Licensed Clinical Social Worker

## 2024-09-18 DIAGNOSIS — F411 Generalized anxiety disorder: Secondary | ICD-10-CM

## 2024-09-18 DIAGNOSIS — F332 Major depressive disorder, recurrent severe without psychotic features: Secondary | ICD-10-CM

## 2024-09-18 NOTE — Progress Notes (Unsigned)
 THERAPIST PROGRESS NOTE   Session Date: 09/18/2024  Session Time: 1308 - 1415  Participation Level: Active  Behavioral Response: CasualAlertEuthymic  Type of Therapy: Individual Therapy  Treatment Goals addressed:   Progressing (6) LTG: Reduce frequency, intensity, and duration of depression symptoms so that daily functioning is improved (OP Depression) LTG: Increase coping skills to manage depression and improve ability to perform daily activities (OP Depression) LTG: I want to be able to talk about my past without becoming so agitated and upset (OP Depression) STG: Report a decrease in anxiety symptoms as evidenced by an overall reduction in anxiety score by a minimum of 25% on the Generalized Anxiety Disorder Scale (GAD-7) (Anxiety) STG: Ozell Boas will reduce frequency of avoidant behaviors by 50% as evidenced by self-report in therapy sessions (Anxiety) LTG: To be able to function and not be so worried about everything (Anxiety)  ProgressTowards Goals: Progressing  Interventions: CBT, Motivational Interviewing, and Supportive  Summary: Boas is a 58 y.o. male with past psych history of MDD, PTSD, and GAD, presenting for follow-up therapy in efforts to improve management of presenting symptoms.  Patient actively engaged in session, presenting in overall pleasant moods, and congruent affect throughout. Patient engaged in introductory check-in, sharing of doing I'd say bout alright, further sharing of having traveled to [place] over the past weekend for a day trip with partner and partner's close friend. Pt further shared of events of trip, noting of rain having occurred 3x throughout the day, expressing to have found 1x to be a blessing in disguise, proving to allow pt, partner, and friend to sit for approx 2hr+/- providing patient the opportunity to rest legs/knees.  Patient improved able to reflect on perspective, showing improved insight improving able to reframe  perspectives, specifically in relation to the rain, as well as proving able to reflect on variances in perspective/outlet being more positive.  Patient shared numerous pictures of which he had taken while at [facility], expressing overall enjoyment with trip.  Patient shared of having felt increasingly fatigued towards the end of the day and during bus ride back to [place] , proving able to rest and catch up on sleep, additionally sharing of having not gotten any sleep during the bus trip to [place], processing potential contributing factors being out of mildly increased anxiousness and overall excitement.  Patient additionally shared further increased instances of joy experienced over the recent weeks, detailing of having purchased a new used vehicle, proving to be a better deal than that of which patient had been considering 2 to 3 months ago. Pt shared of recent trip to the [facility] [employer] in [place], [place], sharing of having encountered family members of who pt feels to be prior racing legends as well as them being familiar with pt's uncle who also used to race competitively over the years, sharing of having found experience to have proven greatly enjoyable.  Pt also shared of having enjoyed recent Thanksgiving holiday, sharing of having eaten with partner and her mother, as well as visiting with own parents and seeing his own children briefly before going and sitting with aunt to care for her throughout the night.  Engaged in assessing presenting depressive and anxious sxs via PHQ-9 and GAD-7, noting of continued downward trend in observed sxs. Engaged in annual review/revision of tx plan, processing progression towards individualized tx goals and continued areas for work.      09/18/2024    2:04 PM 06/25/2024    2:43 PM 06/05/2024  2:34 PM 05/22/2024    2:18 PM  GAD 7 : Generalized Anxiety Score  Nervous, Anxious, on Edge 0 2 1 2   Control/stop worrying 1 1 1 2   Worry too much -  different things 0 1 1 1   Trouble relaxing 2 2 2 2   Restless 1 1 1 1   Easily annoyed or irritable 2 2 2 1   Afraid - awful might happen 0 0 1 1  Total GAD 7 Score 6 9 9 10   Anxiety Difficulty Very difficult Very difficult Very difficult Somewhat difficult      09/18/2024    2:02 PM 06/25/2024    2:43 PM 06/05/2024    2:29 PM 05/22/2024    2:13 PM 05/07/2024    1:25 PM  Depression screen PHQ 2/9  Decreased Interest 0 1 1 2 1   Down, Depressed, Hopeless 1 2 1 2 1   PHQ - 2 Score 1 3 2 4 2   Altered sleeping 1 2 2 2 2   Tired, decreased energy 1 1 2 3 1   Change in appetite 0 1 1 1 1   Feeling bad or failure about yourself  1 2 2 2 1   Trouble concentrating 0 1 1 2 1   Moving slowly or fidgety/restless 0 1 0 0 0  Suicidal thoughts 0 0 0 0 0  PHQ-9 Score 4 11  10  14  8    Difficult doing work/chores Somewhat difficult Very difficult Very difficult Somewhat difficult Somewhat difficult     Data saved with a previous flowsheet row definition   Suicidal/Homicidal: No  Therapist Response:  Clinician openly greeted patient upon presenting for today's visit, assessing presenting mood and affect, actively engaging in check-in. Utilized open ended questions in eliciting recounts of events of recent weeks, utilizing active listening techniques to provide support to pt in exploration of recent events and any presenting challenges. Utilized psychoed, CBT, MI, and supportive reflections in aiding pt in processing thoughts and perspectives. Clinician reassessed severity of depressive and anxious sxs, and presence of any safety concerns.   [x]  Cognitive Challenging [x]  Cognitive Refocusing [x]  Cognitive Reframing  []  Communication Skills []  Compliance Issues []  DBT  [x]  Exploration of Coping Patterns [x]  Exploration of Emotions [x]  Exploration of Relationship Patterns  []  Guided Imagery []  Interactive Feedback []  Interpersonal Resolutions []  Mindfulness Training []  Preventative Services [x]   Psycho-Education []  Relaxation/Deep Breathing [x]  Review of Treatment Plan/Progress []  Role-Play/Behavioral Rehearsal [x]  Structured Problem Solving [x]  Supportive Reflection [x]  Symptom Management  []  Other  Patient responded well to interventions. Patient continues to meet criteria for MDD, PTSD, and GAD. Patient will continue to benefit from engagement in outpatient therapy due to being the least restrictive service to meet presenting needs. Pt proves to be maintaining progression towards tx goals.  Homework: None.   Plan: Return again in 2 weeks.  Diagnosis:  Encounter Diagnoses  Name Primary?   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  Patient/Guardian was advised Release of Information must be obtained prior to any record release in order to collaborate their care with an outside provider. Patient/Guardian was advised if they have not already done so to contact the registration department to sign all necessary forms in order for us  to release information regarding their care.   Consent: Patient/Guardian gives verbal consent for treatment and assignment of benefits for services provided during this  visit. Patient/Guardian expressed understanding and agreed to proceed.   [name], MSW, LCSW 09/18/2024,  2:06 PM

## 2024-10-16 ENCOUNTER — Ambulatory Visit (INDEPENDENT_AMBULATORY_CARE_PROVIDER_SITE_OTHER): Payer: MEDICAID | Admitting: Licensed Clinical Social Worker

## 2024-10-16 DIAGNOSIS — F411 Generalized anxiety disorder: Secondary | ICD-10-CM | POA: Diagnosis not present

## 2024-10-16 DIAGNOSIS — F332 Major depressive disorder, recurrent severe without psychotic features: Secondary | ICD-10-CM

## 2024-10-16 NOTE — Progress Notes (Unsigned)
 THERAPIST PROGRESS NOTE   Session Date: 10/16/2024  Session Time: 1516 - 1613  Participation Level: Active  Behavioral Response: CasualAlertEuthymic  Type of Therapy: Individual Therapy  Treatment Goals addressed:   Progressing (6) LTG: Reduce frequency, intensity, and duration of depression symptoms so that daily functioning is improved (OP Depression) LTG: Increase coping skills to manage depression and improve ability to perform daily activities (OP Depression) LTG: I want to be able to talk about my past without becoming so agitated and upset (OP Depression) STG: Report a decrease in anxiety symptoms as evidenced by an overall reduction in anxiety score by a minimum of 25% on the Generalized Anxiety Disorder Scale (GAD-7) (Anxiety) STG: Jack [name] will reduce frequency of avoidant behaviors by 50% as evidenced by self-report in therapy sessions (Anxiety) LTG: To be able to function and not be so worried about everything (Anxiety)  ProgressTowards Goals: Progressing  Interventions: CBT, Motivational Interviewing, and Supportive  Summary: Jack [name] is a 59 y.o. male with past psych history of MDD, PTSD, and GAD, presenting for follow-up therapy in efforts to improve management of presenting symptoms.  Patient actively engaged in session, presenting with an overall pleasant mood and congruent affect. During initial check-in, patient reported feeling bout stiff as a board and increasingly fatigued, noting significant mental exhaustion after caring for his aunt overnight with minimal rest. Patient processed distinctions between physical and mental fatigue, describing personal beliefs that mental exhaustion is more taxing, using examples such as nurses managing multiple patients, coping with patient loss, and needing to remain fully present despite traumatic workplace experiences. Patient explored empathy for individuals in high-stress caregiving roles and reflected on how such  emotional strain can impact functioning, attention, and safety.  Patient further reflected on events over the past month, sharing that they waited until the last minute to complete Christmas shopping, going out on Christmas Eve and experiencing heightened anxiety symptoms. Patient described increased irritability toward unsafe drivers, elevated awareness of others rude behaviors, and recognition that procrastination contributed to added stress that had a lasting effect on mood. Patient expressed gratitude for the clinicians support over the past year, stating he felt significantly helped in navigating difficult thoughts and emotions that affect daily functioning.     09/18/2024    2:04 PM 06/25/2024    2:43 PM 06/05/2024    2:34 PM 05/22/2024    2:18 PM  GAD 7 : Generalized Anxiety Score  Nervous, Anxious, on Edge 0 2 1 2   Control/stop worrying 1 1 1 2   Worry too much - different things 0 1 1 1   Trouble relaxing 2 2 2 2   Restless 1 1 1 1   Easily annoyed or irritable 2 2 2 1   Afraid - awful might happen 0 0 1 1  Total GAD 7 Score 6 9 9 10   Anxiety Difficulty Very difficult Very difficult Very difficult Somewhat difficult      09/18/2024    2:02 PM 06/25/2024    2:43 PM 06/05/2024    2:29 PM 05/22/2024    2:13 PM 05/07/2024    1:25 PM  Depression screen PHQ 2/9  Decreased Interest 0 1 1 2 1   Down, Depressed, Hopeless 1 2 1 2 1   PHQ - 2 Score 1 3 2 4 2   Altered sleeping 1 2 2 2 2   Tired, decreased energy 1 1 2 3 1   Change in appetite 0 1 1 1 1   Feeling bad or failure about yourself  1 2 2 2  1  Trouble concentrating 0 1 1 2 1   Moving slowly or fidgety/restless 0 1 0 0 0  Suicidal thoughts 0 0 0 0 0  PHQ-9 Score 4 11  10  14  8    Difficult doing work/chores Somewhat difficult Very difficult Very difficult Somewhat difficult Somewhat difficult     Data saved with a previous flowsheet row definition   Suicidal/Homicidal: No  Therapist Response:  Clinician openly greeted patient  upon presenting for today's visit, assessing presenting mood and affect, actively engaging in check-in. Utilized open ended questions in eliciting recounts of events of recent weeks, utilizing active listening techniques to provide support to pt in exploration of recent events and any presenting challenges. CBT strategies used to help patient identify connections between stressors, thought patterns, and emotional reactivity; MI and supportive/strengths-based reflections utilized to acknowledge patients insight, resilience, and self-awareness while reinforcing healthy coping and self-care practices. Therapist validated the patients emotional and physical fatigue, reinforced their insight into stress-related triggers, and encouraged continued use of coping strategies to manage overwhelm. Therapist expressed appreciation for the patients feedback and affirmed ongoing commitment to collaborative work. Clinician reassessed severity of depressive and anxious sxs, and presence of any safety concerns.    [x]  Cognitive Challenging [x]  Cognitive Refocusing [x]  Cognitive Reframing  []  Communication Skills []  Compliance Issues []  DBT  [x]  Exploration of Coping Patterns [x]  Exploration of Emotions [x]  Exploration of Relationship Patterns  []  Guided Imagery []  Interactive Feedback []  Interpersonal Resolutions []  Mindfulness Training []  Preventative Services [x]  Psycho-Education []  Relaxation/Deep Breathing [x]  Review of Treatment Plan/Progress []  Role-Play/Behavioral Rehearsal [x]  Structured Problem Solving [x]  Supportive Reflection [x]  Symptom Management  []  Other  Patient responded well to interventions. Patient continues to meet criteria for MDD, and GAD. Patient will continue to benefit from engagement in outpatient therapy due to being the least restrictive service to meet presenting needs. Pt proves to be maintaining progression towards tx goals.  Homework: None.   Plan: Return again in 2  weeks.  Diagnosis:  Encounter Diagnoses  Name Primary?   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  Patient/Guardian was advised Release of Information must be obtained prior to any record release in order to collaborate their care with an outside provider. Patient/Guardian was advised if they have not already done so to contact the registration department to sign all necessary forms in order for us  to release information regarding their care.   Consent: Patient/Guardian gives verbal consent for treatment and assignment of benefits for services provided during this visit. Patient/Guardian expressed understanding and agreed to proceed.   [name], MSW, LCSW 10/16/2024,  3:20 PM

## 2024-10-22 ENCOUNTER — Encounter: Payer: MEDICAID | Admitting: Family Medicine

## 2024-10-27 ENCOUNTER — Encounter: Payer: Self-pay | Admitting: Family Medicine

## 2024-10-27 ENCOUNTER — Ambulatory Visit (INDEPENDENT_AMBULATORY_CARE_PROVIDER_SITE_OTHER): Payer: MEDICAID | Admitting: Family Medicine

## 2024-10-27 VITALS — BP 125/74 | HR 97 | Temp 97.9°F | Ht 67.5 in | Wt 200.1 lb

## 2024-10-27 DIAGNOSIS — Z23 Encounter for immunization: Secondary | ICD-10-CM | POA: Diagnosis not present

## 2024-10-27 DIAGNOSIS — F4310 Post-traumatic stress disorder, unspecified: Secondary | ICD-10-CM | POA: Diagnosis not present

## 2024-10-27 DIAGNOSIS — M5441 Lumbago with sciatica, right side: Secondary | ICD-10-CM

## 2024-10-27 DIAGNOSIS — Z83518 Family history of other specified eye disorder: Secondary | ICD-10-CM

## 2024-10-27 DIAGNOSIS — F332 Major depressive disorder, recurrent severe without psychotic features: Secondary | ICD-10-CM | POA: Diagnosis not present

## 2024-10-27 DIAGNOSIS — H1013 Acute atopic conjunctivitis, bilateral: Secondary | ICD-10-CM | POA: Diagnosis not present

## 2024-10-27 DIAGNOSIS — G8929 Other chronic pain: Secondary | ICD-10-CM

## 2024-10-27 DIAGNOSIS — E559 Vitamin D deficiency, unspecified: Secondary | ICD-10-CM | POA: Diagnosis not present

## 2024-10-27 DIAGNOSIS — F419 Anxiety disorder, unspecified: Secondary | ICD-10-CM | POA: Diagnosis not present

## 2024-10-27 MED ORDER — CELECOXIB 100 MG PO CAPS: 100.0000 mg | ORAL_CAPSULE | Freq: Two times a day (BID) | ORAL | 3 refills | Status: AC | PRN

## 2024-10-27 MED ORDER — OLOPATADINE HCL 0.2 % OP SOLN: 1.0000 [drp] | Freq: Every day | OPHTHALMIC | 99 refills | Status: AC | PRN

## 2024-10-27 NOTE — Progress Notes (Signed)
 "  Subjective: CC: Follow-up mood, low back pain PCP: Jolinda Norene HERO, DO YEP:Jack [name] is a 59 y.o. male presenting to clinic today for:  Patient reports some help of the Celebrex  with low back pain.  He is taking it twice daily consistently.  Denies any GI side effects.  Continues to follow-up with psychology and psychiatry for PTSD, MDD and GAD.  He still been having some mood fluctuations.  He reports a near fall today due to the ice on the ground and notes that this caused a lot of anxiety.  He is not yet ready to get surgery and is trying to delay that as long as possible despite it being recommended.  He has been compliant with the vitamin D  as prescribed but notes that he has not noticed any improvement in energy or mood with it.  He also notes bilateral itchy eyes and has been utilizing some type of over-the-counter eyedrop called Rohto.  It does seem to help a little bit but he really would like to get an eye exam and has tried 11 different offices but none of them would take his insurance.  He has a family history of glaucoma, macular degeneration and reports that his in fact his father has had such bad macular degeneration that his license has been revoked.  He worries about this for himself.  He personally wears eyeglasses and is overdue for an eye exam.   ROS: Per HPI  Allergies[1] Past Medical History:  Diagnosis Date   Anxiety    Arthritis    GERD (gastroesophageal reflux disease)    Headache    occasional    History of kidney stones    Current Medications[2] Social History   Socioeconomic History   Marital status: Single    Spouse name: Not on file   Number of children: Not on file   Years of education: Not on file   Highest education level: 12th grade  Occupational History   Not on file  Tobacco Use   Smoking status: Never   Smokeless tobacco: Former    Types: Designer, Multimedia Use   Vaping status: Never Used  Substance and Sexual Activity   Alcohol  use: Not Currently    Alcohol/week: 0.0 standard drinks of alcohol    Comment: occasional    Drug use: No   Sexual activity: Not on file  Other Topics Concern   Not on file  Social History Narrative   Not on file   Social Drivers of Health   Tobacco Use: Medium Risk (06/25/2024)   Patient History    Smoking Tobacco Use: Never    Smokeless Tobacco Use: Former    Passive Exposure: Not on Actuary Strain: High Risk (10/26/2023)   Overall Financial Resource Strain (CARDIA)    Difficulty of Paying Living Expenses: Hard  Food Insecurity: Patient Declined (10/26/2023)   Hunger Vital Sign    Worried About Running Out of Food in the Last Year: Patient declined    Ran Out of Food in the Last Year: Patient declined  Transportation Needs: Unmet Transportation Needs (10/26/2023)   PRAPARE - Administrator, Civil Service (Medical): No    Lack of Transportation (Non-Medical): Yes  Physical Activity: Unknown (10/26/2023)   Exercise Vital Sign    Days of Exercise per Week: Patient declined    Minutes of Exercise per Session: Not on file  Stress: Stress Concern Present (10/26/2023)   [employer] of Occupational Health -  Occupational Stress Questionnaire    Feeling of Stress : Very much  Social Connections: Unknown (10/26/2023)   Social Connection and Isolation Panel    Frequency of Communication with Friends and Family: Patient declined    Frequency of Social Gatherings with Friends and Family: Patient declined    Attends Religious Services: Patient declined    Database Administrator or Organizations: No    Attends Engineer, Structural: Not on file    Marital Status: Divorced  Intimate Partner Violence: Not on file  Depression (PHQ2-9): Low Risk (09/18/2024)   Depression (PHQ2-9)    PHQ-2 Score: 4  Recent Concern: Depression (PHQ2-9) - High Risk (06/25/2024)   Depression (PHQ2-9)    PHQ-2 Score: 11  Alcohol Screen: Low Risk (10/26/2023)   Alcohol  Screen    Last Alcohol Screening Score (AUDIT): 3  Housing: Low Risk (10/26/2023)   Housing Stability Vital Sign    Unable to Pay for Housing in the Last Year: No    Number of Times Moved in the Last Year: 0    Homeless in the Last Year: No  Utilities: Not on file  Health Literacy: Not on file   Family History  Problem Relation Age of Onset   Arthritis Mother     Objective: Office vital signs reviewed. BP 125/74   Pulse 97   Temp 97.9 F (36.6 C)   Ht 5' 7.5 (1.715 m)   Wt 200 lb 2 oz (90.8 kg)   SpO2 96%   BMI 30.88 kg/m   Physical Examination:  General: Awake, alert, nontoxic male, No acute distress HEENT: Injected sclera bilaterally.  No foreign body appreciated.  PERRLA, EOMI. Cardio: regular rate and rhythm  Pulm:  normal work of breathing on room air MSK: Ambulating independently with antalgic gait and station. Psych: Mood somewhat hyper.  Speech slightly pressured.  Eye contact fair.  Does not appear to be responding to internal stimuli.  Pleasant, interactive     09/18/2024    2:02 PM 06/25/2024    2:43 PM 06/05/2024    2:29 PM  Depression screen PHQ 2/9  Decreased Interest  1   Down, Depressed, Hopeless  2   PHQ - 2 Score  3   Altered sleeping  2   Tired, decreased energy  1   Change in appetite  1   Feeling bad or failure about yourself   2   Trouble concentrating  1   Moving slowly or fidgety/restless  1   Suicidal thoughts  0   PHQ-9 Score  11    Difficult doing work/chores  Very difficult      Information is confidential and restricted. Go to Review Flowsheets to unlock data.   Data saved with a previous flowsheet row definition      09/18/2024    2:04 PM 06/25/2024    2:43 PM 06/05/2024    2:34 PM 05/22/2024    2:18 PM  GAD 7 : Generalized Anxiety Score  Nervous, Anxious, on Edge  2    Control/stop worrying  1    Worry too much - different things  1    Trouble relaxing  2    Restless  1    Easily annoyed or irritable  2    Afraid - awful  might happen  0    Total GAD 7 Score  9    Anxiety Difficulty  Very difficult       Information is confidential and restricted. Go to  Review Flowsheets to unlock data.    Assessment/ Plan: 59 y.o. male   Chronic right-sided low back pain with right-sided sciatica - Plan: celecoxib  (CELEBREX ) 100 MG capsule  Anxiety  Severe episode of recurrent major depressive disorder, without psychotic features (HCC)  PTSD (post-traumatic stress disorder)  Need for hepatitis B vaccination  Allergic conjunctivitis of both eyes - Plan: Olopatadine  HCl 0.2 % SOLN, Ambulatory referral to Ophthalmology  Family history of macular degeneration - Plan: Ambulatory referral to Ophthalmology  Vitamin D  deficiency - Plan: VITAMIN D  25 Hydroxy (Vit-D Deficiency, Fractures)   Back pain responding to Celebrex  and we will continue this as needed.  He will follow-up with specialist for his back as needed  Continue management of mood with psychiatry and psychology  #1 hepatitis B vaccination administered today  Referral to ophthalmology for eye exam.  Pataday  ordered for allergic conjunctivitis.  Vitamin D  lab collected today given history deficiency.   Norene CHRISTELLA Fielding, DO [facility] [phone]     [1]  Allergies Allergen Reactions   Tramadol  Nausea And Vomiting  [2]  Current Outpatient Medications:    celecoxib  (CELEBREX ) 100 MG capsule, Take 1 capsule (100 mg total) by mouth 2 (two) times daily as needed for moderate pain (pain score 4-6)., Disp: 60 capsule, Rfl: 3   DULoxetine  (CYMBALTA ) 30 MG capsule, Take 3 capsules (90 mg total) by mouth daily. **new dose, Disp: 270 capsule, Rfl: 3   prazosin  (MINIPRESS ) 1 MG capsule, Take 2 capsules (2 mg total) by mouth at bedtime., Disp: 60 capsule, Rfl: 0   pregabalin (LYRICA) 100 MG capsule, Take 100 mg by mouth at bedtime., Disp: , Rfl:    tiZANidine  (ZANAFLEX ) 2 MG tablet, Take 1 tablet (2 mg total) by mouth 2 (two)  times daily as needed for muscle spasms., Disp: 180 tablet, Rfl: 3   Vitamin D , Ergocalciferol , (DRISDOL ) 1.25 MG (50000 UNIT) CAPS capsule, Take 1 capsule (50,000 Units total) by mouth every 7 (seven) days., Disp: 12 capsule, Rfl: 3  "

## 2024-10-27 NOTE — Addendum Note (Signed)
 Addended by: SHERRE SUZEN PARAS on: 10/27/2024 09:49 AM   Modules accepted: Orders

## 2024-10-28 ENCOUNTER — Ambulatory Visit: Payer: Self-pay | Admitting: Family Medicine

## 2024-10-28 LAB — VITAMIN D 25 HYDROXY (VIT D DEFICIENCY, FRACTURES): Vit D, 25-Hydroxy: 37.7 ng/mL (ref 30.0–100.0)

## 2024-10-30 ENCOUNTER — Ambulatory Visit (INDEPENDENT_AMBULATORY_CARE_PROVIDER_SITE_OTHER): Payer: MEDICAID | Admitting: Licensed Clinical Social Worker

## 2024-10-30 DIAGNOSIS — F331 Major depressive disorder, recurrent, moderate: Secondary | ICD-10-CM

## 2024-10-30 DIAGNOSIS — F411 Generalized anxiety disorder: Secondary | ICD-10-CM

## 2024-10-30 DIAGNOSIS — F4310 Post-traumatic stress disorder, unspecified: Secondary | ICD-10-CM

## 2024-10-30 NOTE — Progress Notes (Signed)
 THERAPIST PROGRESS NOTE   Session Date: 10/30/2024  Session Time: 1310 - 1410  Participation Level: Active  Behavioral Response: CasualAlertEuthymic  Type of Therapy: Individual Therapy  Treatment Goals addressed:   Progressing (6) LTG: Reduce frequency, intensity, and duration of depression symptoms so that daily functioning is improved (OP Depression) LTG: Increase coping skills to manage depression and improve ability to perform daily activities (OP Depression) LTG: I want to be able to talk about my past without becoming so agitated and upset (OP Depression) STG: Report a decrease in anxiety symptoms as evidenced by an overall reduction in anxiety score by a minimum of 25% on the Generalized Anxiety Disorder Scale (GAD-7) (Anxiety) STG: Ozell Boas will reduce frequency of avoidant behaviors by 50% as evidenced by self-report in therapy sessions (Anxiety) LTG: To be able to function and not be so worried about everything (Anxiety)  ProgressTowards Goals: Progressing  Interventions: CBT, Motivational Interviewing, and Supportive  Summary: Boas is a 59 y.o. male with past psych history of MDD, PTSD, and GAD, presenting for follow-up therapy in efforts to improve management of presenting symptoms.  Patient actively engaged in todays session, presenting with an overall pleasant mood and congruent affect, though becoming intermittently tearful when processing current stressors. During check-in, pt reported doing well overall but noted mild stress related to health complications affecting several long-time family members and friends from pts hometown. Pt reflected on the emotional weight of witnessing declining physical health among individuals known since childhood, acknowledging the challenge of carrying stressors that others may not fully recognize or understand.  Pt participated in exploration of how chronic and acute stress exposures influence long-term physical and  emotional wellbeing. 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 Discussed how the sudden removal or reduction of stress in such individuals can paradoxically reveal underlying health vulnerabilities, similar to complications experienced following years of accumulated stress.  Session further explored emotional challenges commonly experienced by men, including societal stigma and expectations surrounding masculinity that often discourage emotional expression or help-seeking. Pt and clinician processed how anger is frequently deemed the only acceptable externalized emotion for men, contributing to internalized distress. Pt reflected on personal patterns with anger, noting a tendency toward rapid escalation, and explored how pts personal history of trauma and exposure to abuse have shaped emotional regulation, reactivity, and tolerance thresholds.     10/27/2024   10:02 AM 09/18/2024    2:04 PM 06/25/2024    2:43 PM 06/05/2024    2:34 PM  GAD 7 : Generalized Anxiety Score  Nervous, Anxious, on Edge 2  0  2  1   Control/stop worrying 1  1  1  1    Worry too much - different things 2  0  1  1   Trouble relaxing 2  2  2  2    Restless 1  1  1  1    Easily annoyed or irritable 3  2  2  2    Afraid - awful might happen 0  0  0  1   Total GAD 7 Score 11 6 9 9   Anxiety Difficulty Somewhat difficult Very difficult Very difficult Very difficult     Data saved with a previous flowsheet row definition      10/27/2024   10:02 AM 09/18/2024    2:02 PM 06/25/2024  2:43 PM 06/05/2024    2:29 PM 05/22/2024    2:13 PM  Depression screen PHQ 2/9  Decreased Interest 2 0 1 1 2   Down, Depressed, Hopeless 2 1 2 1 2   PHQ - 2 Score 4 1 3 2 4   Altered sleeping 2 1 2 2 2   Tired,  decreased energy 1 1 1 2 3   Change in appetite 1 0 1 1 1   Feeling bad or failure about yourself  2 1 2 2 2   Trouble concentrating 1 0 1 1 2   Moving slowly or fidgety/restless 2 0 1 0 0  Suicidal thoughts 0 0 0 0 0  PHQ-9 Score 13 4 11  10  14    Difficult doing work/chores Very difficult Somewhat difficult Very difficult Very difficult Somewhat difficult     Data saved with a previous flowsheet row definition   Suicidal/Homicidal: No  Therapist Response:  Clinician openly greeted patient upon presenting for today's visit, assessing presenting mood and affect, actively engaging in check-in. Utilized open ended questions in eliciting recounts of events of recent weeks, utilizing active listening techniques to provide support to pt in exploration of recent events and any presenting challenges. Interventions included supportive reflection, psychoeducation on chronic stress response and physiological impact, and CBT-informed exploration of emotional triggers and learned behavioral patterns. Clinician validated pts insight regarding gender-role expectations and normalized challenges related to trauma-influenced emotional regulation, reinforcing pts strengths in self-awareness and desire for healthier coping. Clinician guided pt in identifying early signs of emotional escalation and collaboratively introduced strategies for grounding, widening emotional expression beyond anger, and reducing cumulative stress burden. Clinician reassessed severity of depressive and anxious sxs, and presence of any safety concerns.    [x]  Cognitive Challenging [x]  Cognitive Refocusing [x]  Cognitive Reframing  []  Communication Skills []  Compliance Issues []  DBT  [x]  Exploration of Coping Patterns [x]  Exploration of Emotions [x]  Exploration of Relationship Patterns  []  Guided Imagery []  Interactive Feedback []  Interpersonal Resolutions []  Mindfulness Training []  Preventative Services [x]  Psycho-Education []   Relaxation/Deep Breathing []  Review of Treatment Plan/Progress []  Role-Play/Behavioral Rehearsal [x]  Structured Problem Solving [x]  Supportive Reflection [x]  Symptom Management  []  Other  Patient responded well to interventions. Patient continues to meet criteria for MDD, and GAD. Patient will continue to benefit from engagement in outpatient therapy due to being the least restrictive service to meet presenting needs. Pt proves to be maintaining progression towards tx goals.  Homework: None.   Plan: Return again in 2 weeks.  Diagnosis:  Encounter Diagnoses  Name Primary?   MDD (major depressive disorder), recurrent episode, moderate (HCC) Yes   GAD (generalized anxiety disorder)    PTSD (post-traumatic stress disorder)      Collaboration of Care: Psychiatrist AEB provider notes available in EHR.  Patient/Guardian was advised Release of Information must be obtained prior to any record release in order to collaborate their care with an outside provider. Patient/Guardian was advised if they have not already done so to contact the registration department to sign all necessary forms in order for us  to release information regarding their care.   Consent: Patient/Guardian gives verbal consent for treatment and assignment of benefits for services provided during this visit. Patient/Guardian expressed understanding and agreed to proceed.   [name], MSW, LCSW 10/30/2024,  1:12 PM

## 2024-11-13 ENCOUNTER — Ambulatory Visit (HOSPITAL_COMMUNITY): Payer: MEDICAID | Admitting: Licensed Clinical Social Worker

## 2024-11-14 ENCOUNTER — Telehealth: Payer: Self-pay | Admitting: Family Medicine

## 2024-11-14 NOTE — Telephone Encounter (Signed)
 I spoke with pt. He is aware I will call [facility] to see what is going on and I will call him back next week. The referring office is closed for the day.

## 2024-11-14 NOTE — Telephone Encounter (Unsigned)
 Copied from CRM #[id number]. Topic: Referral - Question >> Nov 14, 2024  2:33 PM Ahlexyia S wrote: Reason for CRM: Pt called in stating that he was contacted by [facility] and was told that they are unable to see the referral that was sent to them due to them doing referrals manually. I attempted to reach their offices to get further clarification but there was no answer the automated system stated they were closed. Pt is unsure of what to do regarding this because he felt as if the person he spoke to was being unkind to him and is wanting to know what Dr.Gottschalk thinks about it and what he should do moving forward.

## 2024-11-20 ENCOUNTER — Ambulatory Visit (HOSPITAL_COMMUNITY): Payer: MEDICAID | Admitting: Licensed Clinical Social Worker

## 2024-11-26 ENCOUNTER — Ambulatory Visit: Payer: MEDICAID

## 2024-11-27 ENCOUNTER — Ambulatory Visit (HOSPITAL_COMMUNITY): Payer: MEDICAID | Admitting: Licensed Clinical Social Worker

## 2024-12-11 ENCOUNTER — Ambulatory Visit (HOSPITAL_COMMUNITY): Payer: MEDICAID | Admitting: Licensed Clinical Social Worker

## 2024-12-25 ENCOUNTER — Ambulatory Visit (HOSPITAL_COMMUNITY): Payer: MEDICAID | Admitting: Licensed Clinical Social Worker

## 2025-01-08 ENCOUNTER — Ambulatory Visit (HOSPITAL_COMMUNITY): Payer: MEDICAID | Admitting: Licensed Clinical Social Worker
